# Patient Record
Sex: Male | Born: 1940
Health system: Southern US, Community
[De-identification: ages and names within clinical notes are randomized; demographics above are authoritative.]

## PROBLEM LIST (undated history)

## (undated) DIAGNOSIS — J309 Allergic rhinitis, unspecified: Secondary | ICD-10-CM

## (undated) DIAGNOSIS — R972 Elevated prostate specific antigen [PSA]: Secondary | ICD-10-CM

## (undated) DIAGNOSIS — Z860100 Personal history of colon polyps, unspecified: Secondary | ICD-10-CM

## (undated) DIAGNOSIS — Z87820 Personal history of traumatic brain injury: Secondary | ICD-10-CM

## (undated) DIAGNOSIS — L309 Dermatitis, unspecified: Secondary | ICD-10-CM

## (undated) DIAGNOSIS — M1990 Unspecified osteoarthritis, unspecified site: Secondary | ICD-10-CM

## (undated) DIAGNOSIS — C61 Malignant neoplasm of prostate: Secondary | ICD-10-CM

## (undated) DIAGNOSIS — R399 Unspecified symptoms and signs involving the genitourinary system: Secondary | ICD-10-CM

## (undated) DIAGNOSIS — E785 Hyperlipidemia, unspecified: Secondary | ICD-10-CM

## (undated) DIAGNOSIS — Z8572 Personal history of non-Hodgkin lymphomas: Secondary | ICD-10-CM

## (undated) DIAGNOSIS — Z973 Presence of spectacles and contact lenses: Secondary | ICD-10-CM

## (undated) HISTORY — DX: Hyperlipidemia, unspecified: E78.5

## (undated) HISTORY — PX: COLONOSCOPY: SHX174

## (undated) HISTORY — DX: Unspecified osteoarthritis, unspecified site: M19.90

---

## 1990-08-02 HISTORY — PX: UMBILICAL HERNIA REPAIR: SHX196

## 1992-08-02 HISTORY — PX: CHOLECYSTECTOMY: SHX55

## 2001-04-11 ENCOUNTER — Ambulatory Visit (HOSPITAL_COMMUNITY): Admission: RE | Admit: 2001-04-11 | Discharge: 2001-04-11 | Payer: Self-pay | Admitting: *Deleted

## 2001-04-11 ENCOUNTER — Encounter: Payer: Self-pay | Admitting: *Deleted

## 2001-05-26 ENCOUNTER — Encounter: Payer: Self-pay | Admitting: *Deleted

## 2001-05-26 ENCOUNTER — Ambulatory Visit (HOSPITAL_COMMUNITY): Admission: RE | Admit: 2001-05-26 | Discharge: 2001-05-26 | Payer: Self-pay | Admitting: *Deleted

## 2001-11-06 ENCOUNTER — Ambulatory Visit (HOSPITAL_COMMUNITY): Admission: RE | Admit: 2001-11-06 | Discharge: 2001-11-06 | Payer: Self-pay | Admitting: *Deleted

## 2001-11-06 ENCOUNTER — Encounter: Payer: Self-pay | Admitting: *Deleted

## 2002-06-05 ENCOUNTER — Encounter: Payer: Self-pay | Admitting: *Deleted

## 2002-06-05 ENCOUNTER — Ambulatory Visit (HOSPITAL_COMMUNITY): Admission: RE | Admit: 2002-06-05 | Discharge: 2002-06-05 | Payer: Self-pay | Admitting: *Deleted

## 2002-12-04 ENCOUNTER — Ambulatory Visit (HOSPITAL_COMMUNITY): Admission: RE | Admit: 2002-12-04 | Discharge: 2002-12-04 | Payer: Self-pay | Admitting: *Deleted

## 2002-12-04 ENCOUNTER — Encounter: Payer: Self-pay | Admitting: *Deleted

## 2003-06-18 ENCOUNTER — Ambulatory Visit (HOSPITAL_COMMUNITY): Admission: RE | Admit: 2003-06-18 | Discharge: 2003-06-18 | Payer: Self-pay | Admitting: Oncology

## 2003-11-18 ENCOUNTER — Emergency Department (HOSPITAL_COMMUNITY): Admission: AC | Admit: 2003-11-18 | Discharge: 2003-11-18 | Payer: Self-pay

## 2003-12-16 ENCOUNTER — Ambulatory Visit (HOSPITAL_COMMUNITY): Admission: RE | Admit: 2003-12-16 | Discharge: 2003-12-16 | Payer: Self-pay | Admitting: Oncology

## 2004-06-09 ENCOUNTER — Ambulatory Visit (HOSPITAL_COMMUNITY): Admission: RE | Admit: 2004-06-09 | Discharge: 2004-06-09 | Payer: Self-pay | Admitting: Oncology

## 2004-06-15 ENCOUNTER — Ambulatory Visit: Payer: Self-pay | Admitting: Oncology

## 2004-08-11 ENCOUNTER — Ambulatory Visit: Payer: Self-pay | Admitting: Internal Medicine

## 2004-09-17 ENCOUNTER — Ambulatory Visit: Payer: Self-pay | Admitting: Internal Medicine

## 2004-10-01 ENCOUNTER — Ambulatory Visit (HOSPITAL_COMMUNITY): Admission: RE | Admit: 2004-10-01 | Discharge: 2004-10-01 | Payer: Self-pay | Admitting: Internal Medicine

## 2004-12-17 ENCOUNTER — Ambulatory Visit: Payer: Self-pay | Admitting: Internal Medicine

## 2005-03-09 ENCOUNTER — Ambulatory Visit: Payer: Self-pay | Admitting: Internal Medicine

## 2005-03-18 ENCOUNTER — Ambulatory Visit: Payer: Self-pay | Admitting: Internal Medicine

## 2005-04-07 ENCOUNTER — Ambulatory Visit: Payer: Self-pay | Admitting: Internal Medicine

## 2005-06-03 ENCOUNTER — Ambulatory Visit: Payer: Self-pay | Admitting: Internal Medicine

## 2005-06-10 ENCOUNTER — Ambulatory Visit: Payer: Self-pay | Admitting: Internal Medicine

## 2005-06-14 ENCOUNTER — Ambulatory Visit: Payer: Self-pay | Admitting: Oncology

## 2005-06-15 ENCOUNTER — Ambulatory Visit: Payer: Self-pay | Admitting: Gastroenterology

## 2005-07-07 ENCOUNTER — Encounter: Payer: Self-pay | Admitting: Internal Medicine

## 2005-07-07 ENCOUNTER — Ambulatory Visit: Payer: Self-pay | Admitting: Gastroenterology

## 2005-11-02 ENCOUNTER — Ambulatory Visit: Payer: Self-pay | Admitting: Internal Medicine

## 2005-11-11 ENCOUNTER — Ambulatory Visit: Payer: Self-pay | Admitting: Internal Medicine

## 2005-11-25 ENCOUNTER — Ambulatory Visit: Payer: Self-pay | Admitting: Internal Medicine

## 2005-11-29 ENCOUNTER — Ambulatory Visit: Payer: Self-pay | Admitting: Internal Medicine

## 2005-12-22 ENCOUNTER — Ambulatory Visit: Payer: Self-pay | Admitting: Internal Medicine

## 2005-12-23 ENCOUNTER — Ambulatory Visit (HOSPITAL_COMMUNITY): Admission: RE | Admit: 2005-12-23 | Discharge: 2005-12-23 | Payer: Self-pay | Admitting: Internal Medicine

## 2006-06-10 ENCOUNTER — Ambulatory Visit: Payer: Self-pay | Admitting: Oncology

## 2006-07-06 ENCOUNTER — Ambulatory Visit: Payer: Self-pay | Admitting: Internal Medicine

## 2006-11-08 ENCOUNTER — Ambulatory Visit: Payer: Self-pay | Admitting: Internal Medicine

## 2006-11-08 LAB — CONVERTED CEMR LAB
AST: 21 units/L (ref 0–37)
Alkaline Phosphatase: 56 units/L (ref 39–117)
Bilirubin, Direct: 0.1 mg/dL (ref 0.0–0.3)
CO2: 30 meq/L (ref 19–32)
Calcium: 9.4 mg/dL (ref 8.4–10.5)
Cholesterol: 152 mg/dL (ref 0–200)
Eosinophils Relative: 5.7 % — ABNORMAL HIGH (ref 0.0–5.0)
GFR calc Af Amer: 110 mL/min
GFR calc non Af Amer: 91 mL/min
HCT: 45.7 % (ref 39.0–52.0)
HDL: 49.1 mg/dL (ref >39.0)
Hemoglobin: 15.7 g/dL (ref 13.0–17.0)
LDL Cholesterol: 90 mg/dL (ref 0–99)
Lymphocytes Relative: 26.8 % (ref 12.0–46.0)
MCHC: 34.3 g/dL (ref 30.0–36.0)
MCV: 93.5 fL (ref 78.0–100.0)
Neutrophils Relative %: 59.4 % (ref 43.0–77.0)
Platelets: 227 10*3/uL (ref 150–400)
Potassium: 4.5 meq/L (ref 3.5–5.1)
RBC: 4.89 M/uL (ref 4.22–5.81)
RDW: 12.3 % (ref 11.5–14.6)
Sodium: 144 meq/L (ref 135–145)
Total CHOL/HDL Ratio: 3.1
Triglycerides: 66 mg/dL (ref 0–149)
WBC: 9.3 10*3/uL (ref 4.5–10.5)

## 2006-11-15 ENCOUNTER — Ambulatory Visit: Payer: Self-pay | Admitting: Internal Medicine

## 2007-01-02 ENCOUNTER — Ambulatory Visit: Payer: Self-pay | Admitting: Internal Medicine

## 2007-03-31 DIAGNOSIS — E785 Hyperlipidemia, unspecified: Secondary | ICD-10-CM | POA: Insufficient documentation

## 2007-03-31 DIAGNOSIS — J479 Bronchiectasis, uncomplicated: Secondary | ICD-10-CM | POA: Insufficient documentation

## 2007-03-31 DIAGNOSIS — J309 Allergic rhinitis, unspecified: Secondary | ICD-10-CM | POA: Insufficient documentation

## 2007-05-18 ENCOUNTER — Ambulatory Visit: Payer: Self-pay | Admitting: Internal Medicine

## 2007-05-18 LAB — CONVERTED CEMR LAB
ALT: 16 units/L (ref 0–53)
AST: 16 units/L (ref 0–37)
Alkaline Phosphatase: 56 units/L (ref 39–117)
Cholesterol: 171 mg/dL (ref 0–200)
LDL Cholesterol: 114 mg/dL — ABNORMAL HIGH (ref 0–99)
Total CHOL/HDL Ratio: 3.9
Triglycerides: 64 mg/dL (ref 0–149)
VLDL: 13 mg/dL (ref 0–40)

## 2007-05-25 ENCOUNTER — Ambulatory Visit: Payer: Self-pay | Admitting: Internal Medicine

## 2007-05-25 DIAGNOSIS — M1990 Unspecified osteoarthritis, unspecified site: Secondary | ICD-10-CM | POA: Insufficient documentation

## 2007-05-25 LAB — CONVERTED CEMR LAB
Cholesterol, target level: 200 mg/dL
HDL goal, serum: 40 mg/dL

## 2007-06-09 ENCOUNTER — Ambulatory Visit: Payer: Self-pay | Admitting: Oncology

## 2007-07-04 ENCOUNTER — Ambulatory Visit: Payer: Self-pay | Admitting: Internal Medicine

## 2007-08-04 ENCOUNTER — Ambulatory Visit: Payer: Self-pay | Admitting: Family Medicine

## 2007-10-04 ENCOUNTER — Ambulatory Visit: Payer: Self-pay | Admitting: Internal Medicine

## 2007-10-04 DIAGNOSIS — J209 Acute bronchitis, unspecified: Secondary | ICD-10-CM | POA: Insufficient documentation

## 2007-10-04 DIAGNOSIS — T887XXA Unspecified adverse effect of drug or medicament, initial encounter: Secondary | ICD-10-CM | POA: Insufficient documentation

## 2007-10-04 LAB — CONVERTED CEMR LAB
CO2: 28 meq/L (ref 19–32)
Creatinine, Ser: 1 mg/dL (ref 0.4–1.5)
GFR calc Af Amer: 97 mL/min
Glucose, Bld: 81 mg/dL (ref 70–99)
Potassium: 4.9 meq/L (ref 3.5–5.1)
Sodium: 137 meq/L (ref 135–145)

## 2007-10-05 ENCOUNTER — Ambulatory Visit: Payer: Self-pay | Admitting: Cardiovascular Disease

## 2007-10-26 ENCOUNTER — Telehealth: Payer: Self-pay | Admitting: Internal Medicine

## 2007-10-27 ENCOUNTER — Ambulatory Visit: Payer: Self-pay | Admitting: Internal Medicine

## 2007-10-30 ENCOUNTER — Ambulatory Visit: Payer: Self-pay | Admitting: Internal Medicine

## 2007-12-07 ENCOUNTER — Ambulatory Visit: Payer: Self-pay | Admitting: Internal Medicine

## 2007-12-07 LAB — CONVERTED CEMR LAB
ALT: 21 units/L (ref 0–53)
Alkaline Phosphatase: 66 units/L (ref 39–117)
BUN: 11 mg/dL (ref 6–23)
Basophils Absolute: 0 10*3/uL (ref 0.0–0.1)
Basophils Relative: 0.1 % (ref 0.0–1.0)
Bilirubin Urine: NEGATIVE
CO2: 31 meq/L (ref 19–32)
Cholesterol: 177 mg/dL (ref 0–200)
Eosinophils Absolute: 1.1 10*3/uL — ABNORMAL HIGH (ref 0.0–0.7)
GFR calc Af Amer: 97 mL/min
GFR calc non Af Amer: 80 mL/min
Glucose, Bld: 99 mg/dL (ref 70–99)
Glucose, Urine, Semiquant: NEGATIVE
HCT: 46.2 % (ref 39.0–52.0)
Ketones, urine, test strip: NEGATIVE
Lymphocytes Relative: 21.3 % (ref 12.0–46.0)
Monocytes Absolute: 0.5 10*3/uL (ref 0.1–1.0)
Monocytes Relative: 5.9 % (ref 3.0–12.0)
PSA: 2.04 ng/mL (ref 0.10–4.00)
Potassium: 4.6 meq/L (ref 3.5–5.1)
Sodium: 143 meq/L (ref 135–145)
Specific Gravity, Urine: 1.015
Total CHOL/HDL Ratio: 4.2
Triglycerides: 85 mg/dL (ref 0–149)
VLDL: 17 mg/dL (ref 0–40)

## 2007-12-19 ENCOUNTER — Ambulatory Visit: Payer: Self-pay | Admitting: Internal Medicine

## 2007-12-19 DIAGNOSIS — Z8572 Personal history of non-Hodgkin lymphomas: Secondary | ICD-10-CM | POA: Insufficient documentation

## 2008-04-02 ENCOUNTER — Telehealth: Payer: Self-pay | Admitting: Internal Medicine

## 2008-04-26 ENCOUNTER — Emergency Department (HOSPITAL_COMMUNITY): Admission: EM | Admit: 2008-04-26 | Discharge: 2008-04-26 | Payer: Self-pay | Admitting: Family Medicine

## 2008-06-11 ENCOUNTER — Ambulatory Visit: Payer: Self-pay | Admitting: Oncology

## 2008-06-20 ENCOUNTER — Ambulatory Visit: Payer: Self-pay | Admitting: Internal Medicine

## 2008-06-26 ENCOUNTER — Encounter: Payer: Self-pay | Admitting: Internal Medicine

## 2008-12-19 ENCOUNTER — Ambulatory Visit: Payer: Self-pay | Admitting: Internal Medicine

## 2008-12-19 LAB — CONVERTED CEMR LAB
Albumin: 4 g/dL (ref 3.5–5.2)
Bilirubin Urine: NEGATIVE
Bilirubin, Direct: 0.1 mg/dL (ref 0.0–0.3)
Cholesterol: 200 mg/dL (ref 0–200)
Eosinophils Absolute: 0.2 10*3/uL (ref 0.0–0.7)
Glucose, Urine, Semiquant: NEGATIVE
HCT: 43.2 % (ref 39.0–52.0)
HDL: 52.5 mg/dL (ref >39.00)
Hemoglobin: 15.1 g/dL (ref 13.0–17.0)
Lymphocytes Relative: 25.6 % (ref 12.0–46.0)
Lymphs Abs: 1.8 10*3/uL (ref 0.7–4.0)
MCHC: 34.8 g/dL (ref 30.0–36.0)
MCV: 96.8 fL (ref 78.0–100.0)
Monocytes Absolute: 0.5 10*3/uL (ref 0.1–1.0)
Neutro Abs: 4.4 10*3/uL (ref 1.4–7.7)
Neutrophils Relative %: 63.6 % (ref 43.0–77.0)
Nitrite: NEGATIVE
PSA: 1.71 ng/mL (ref 0.10–4.00)
Platelets: 196 10*3/uL (ref 150.0–400.0)
Potassium: 3.8 meq/L (ref 3.5–5.1)
RDW: 12 % (ref 11.5–14.6)
Sodium: 142 meq/L (ref 135–145)
Specific Gravity, Urine: 1.02
Triglycerides: 46 mg/dL (ref 0.0–149.0)
Urobilinogen, UA: 0.2

## 2009-01-07 ENCOUNTER — Ambulatory Visit: Payer: Self-pay | Admitting: Internal Medicine

## 2009-01-07 DIAGNOSIS — B351 Tinea unguium: Secondary | ICD-10-CM | POA: Insufficient documentation

## 2009-02-24 ENCOUNTER — Telehealth: Payer: Self-pay | Admitting: Internal Medicine

## 2009-04-09 ENCOUNTER — Ambulatory Visit: Payer: Self-pay | Admitting: Internal Medicine

## 2009-04-09 LAB — CONVERTED CEMR LAB
AST: 21 units/L (ref 0–37)
Cholesterol: 148 mg/dL (ref 0–200)
Total Protein: 6.9 g/dL (ref 6.0–8.3)
Triglycerides: 62 mg/dL (ref 0.0–149.0)
VLDL: 12.4 mg/dL (ref 0.0–40.0)

## 2009-04-16 ENCOUNTER — Ambulatory Visit: Payer: Self-pay | Admitting: Internal Medicine

## 2009-04-25 ENCOUNTER — Telehealth (INDEPENDENT_AMBULATORY_CARE_PROVIDER_SITE_OTHER): Payer: Self-pay | Admitting: *Deleted

## 2009-05-12 ENCOUNTER — Ambulatory Visit: Payer: Self-pay | Admitting: Internal Medicine

## 2009-06-10 ENCOUNTER — Telehealth: Payer: Self-pay | Admitting: Internal Medicine

## 2009-06-11 ENCOUNTER — Ambulatory Visit: Payer: Self-pay | Admitting: Internal Medicine

## 2009-06-11 ENCOUNTER — Ambulatory Visit: Payer: Self-pay | Admitting: Family Medicine

## 2009-06-11 DIAGNOSIS — I6200 Nontraumatic subdural hemorrhage, unspecified: Secondary | ICD-10-CM | POA: Insufficient documentation

## 2009-06-20 ENCOUNTER — Ambulatory Visit: Payer: Self-pay | Admitting: Cardiology

## 2009-06-23 ENCOUNTER — Encounter (INDEPENDENT_AMBULATORY_CARE_PROVIDER_SITE_OTHER): Payer: Self-pay | Admitting: *Deleted

## 2009-07-02 ENCOUNTER — Ambulatory Visit: Payer: Self-pay | Admitting: Oncology

## 2009-07-04 ENCOUNTER — Encounter: Payer: Self-pay | Admitting: Internal Medicine

## 2009-07-10 ENCOUNTER — Ambulatory Visit: Payer: Self-pay | Admitting: Internal Medicine

## 2009-07-10 LAB — CONVERTED CEMR LAB
ALT: 24 units/L (ref 0–53)
AST: 21 units/L (ref 0–37)
Alkaline Phosphatase: 57 units/L (ref 39–117)
Bilirubin, Direct: 0 mg/dL (ref 0.0–0.3)

## 2009-07-31 ENCOUNTER — Encounter: Admission: RE | Admit: 2009-07-31 | Discharge: 2009-07-31 | Payer: Self-pay | Admitting: Neurosurgery

## 2009-08-06 ENCOUNTER — Telehealth: Payer: Self-pay | Admitting: Internal Medicine

## 2009-08-07 ENCOUNTER — Encounter: Payer: Self-pay | Admitting: Internal Medicine

## 2009-08-15 ENCOUNTER — Ambulatory Visit: Payer: Self-pay | Admitting: Internal Medicine

## 2009-08-15 DIAGNOSIS — J320 Chronic maxillary sinusitis: Secondary | ICD-10-CM | POA: Insufficient documentation

## 2009-09-12 ENCOUNTER — Encounter: Admission: RE | Admit: 2009-09-12 | Discharge: 2009-09-12 | Payer: Self-pay | Admitting: Neurosurgery

## 2009-09-23 ENCOUNTER — Telehealth: Payer: Self-pay | Admitting: Internal Medicine

## 2009-09-24 ENCOUNTER — Telehealth: Payer: Self-pay | Admitting: Internal Medicine

## 2009-09-29 ENCOUNTER — Encounter: Payer: Self-pay | Admitting: Internal Medicine

## 2009-10-10 ENCOUNTER — Ambulatory Visit: Payer: Self-pay | Admitting: Internal Medicine

## 2009-11-04 ENCOUNTER — Telehealth: Payer: Self-pay | Admitting: Internal Medicine

## 2010-02-05 ENCOUNTER — Ambulatory Visit: Payer: Self-pay | Admitting: Internal Medicine

## 2010-02-05 LAB — CONVERTED CEMR LAB
ALT: 25 units/L (ref 0–53)
AST: 24 units/L (ref 0–37)
Alkaline Phosphatase: 59 units/L (ref 39–117)
Basophils Relative: 0.7 % (ref 0.0–3.0)
Bilirubin, Direct: 0.2 mg/dL (ref 0.0–0.3)
Calcium: 9.5 mg/dL (ref 8.4–10.5)
Chloride: 106 meq/L (ref 96–112)
Eosinophils Relative: 3.6 % (ref 0.0–5.0)
HCT: 45.7 % (ref 39.0–52.0)
HDL: 55.2 mg/dL (ref >39.00)
Hemoglobin: 15.3 g/dL (ref 13.0–17.0)
Ketones, urine, test strip: NEGATIVE
LDL Cholesterol: 102 mg/dL — ABNORMAL HIGH (ref 0–99)
Lymphocytes Relative: 28.4 % (ref 12.0–46.0)
MCV: 96.2 fL (ref 78.0–100.0)
Monocytes Absolute: 0.6 10*3/uL (ref 0.1–1.0)
Monocytes Relative: 6.9 % (ref 3.0–12.0)
PSA: 2.1 ng/mL (ref 0.10–4.00)
RBC: 4.75 M/uL (ref 4.22–5.81)
Sodium: 144 meq/L (ref 135–145)
TSH: 1.61 microintl units/mL (ref 0.35–5.50)
Total Bilirubin: 0.6 mg/dL (ref 0.3–1.2)
Triglycerides: 56 mg/dL (ref 0.0–149.0)
WBC Urine, dipstick: NEGATIVE
pH: 7

## 2010-02-25 ENCOUNTER — Ambulatory Visit: Payer: Self-pay | Admitting: Internal Medicine

## 2010-03-16 ENCOUNTER — Telehealth: Payer: Self-pay | Admitting: Internal Medicine

## 2010-03-26 ENCOUNTER — Telehealth: Payer: Self-pay | Admitting: Internal Medicine

## 2010-03-26 ENCOUNTER — Ambulatory Visit: Payer: Self-pay | Admitting: Family Medicine

## 2010-06-04 ENCOUNTER — Ambulatory Visit: Payer: Self-pay | Admitting: Family Medicine

## 2010-07-02 ENCOUNTER — Ambulatory Visit: Payer: Self-pay | Admitting: Oncology

## 2010-07-03 ENCOUNTER — Encounter: Payer: Self-pay | Admitting: Internal Medicine

## 2010-08-13 ENCOUNTER — Ambulatory Visit
Admission: RE | Admit: 2010-08-13 | Discharge: 2010-08-13 | Payer: Self-pay | Source: Home / Self Care | Attending: Internal Medicine | Admitting: Internal Medicine

## 2010-08-13 ENCOUNTER — Other Ambulatory Visit: Payer: Self-pay | Admitting: Internal Medicine

## 2010-08-13 LAB — HEPATIC FUNCTION PANEL
ALT: 38 U/L (ref 0–53)
AST: 27 U/L (ref 0–37)
Albumin: 4.1 g/dL (ref 3.5–5.2)
Alkaline Phosphatase: 61 U/L (ref 39–117)
Bilirubin, Direct: 0.1 mg/dL (ref 0.0–0.3)
Total Bilirubin: 0.8 mg/dL (ref 0.3–1.2)
Total Protein: 6.7 g/dL (ref 6.0–8.3)

## 2010-08-13 LAB — LIPID PANEL
Cholesterol: 169 mg/dL (ref 0–200)
HDL: 50.4 mg/dL (ref >39.00)
LDL Cholesterol: 109 mg/dL — ABNORMAL HIGH (ref 0–99)
Total CHOL/HDL Ratio: 3
Triglycerides: 50 mg/dL (ref 0.0–149.0)
VLDL: 10 mg/dL (ref 0.0–40.0)

## 2010-08-19 ENCOUNTER — Ambulatory Visit
Admission: RE | Admit: 2010-08-19 | Discharge: 2010-08-19 | Payer: Self-pay | Source: Home / Self Care | Attending: Internal Medicine | Admitting: Internal Medicine

## 2010-09-01 NOTE — Assessment & Plan Note (Signed)
Summary: cough and congestion after z pack/bmw   Vital Signs:  Patient profile:   70 year old male Temp:     98.6 degrees F oral BP sitting:   118 / 68  (left arm) Cuff size:   regular  Vitals Entered By: Sid Falcon LPN (March 26, 2010 9:59 AM)  History of Present Illness: Same-day appointment.  Patient seen with a cough. Initial onset almost 2 weeks ago with productive cough and low-grade fever. Patient was placed on Zithromax and felt better on antibiotics. Stopped antibiotics last Friday and over the weekend had onset of recurrent productive cough. History of bronchiectasis. No fever this time. No hemoptysis. No dyspnea. Taking decongestant cough medication with minimal improvement. History of frequent respiratory issues in the past. Nonsmoker  Allergies (verified): No Known Drug Allergies  Past History:  Past Medical History: Last updated: 05/25/2007 Allergic rhinitis Hyperlipidemia lymphoma--non Hodgkins bronchiectasis Osteoarthritis  Review of Systems      See HPI  Physical Exam  General:  Well-developed,well-nourished,in no acute distress; alert,appropriate and cooperative throughout examination Ears:  External ear exam shows no significant lesions or deformities.  Otoscopic examination reveals clear canals, tympanic membranes are intact bilaterally without bulging, retraction, inflammation or discharge. Hearing is grossly normal bilaterally. Mouth:  Oral mucosa and oropharynx without lesions or exudates.  Teeth in good repair. Neck:  No deformities, masses, or tenderness noted. Lungs:  patient has no rales. He has a few faint inspiratory wheezes but no expiratory wheezes. No retractions. Symmetric breath sounds Heart:  normal rate and regular rhythm.   Extremities:  no edema   Impression & Recommendations:  Problem # 1:  ACUTE BRONCHITIS (ICD-466.0) samples of Avelox 400 mg daily for 7 days. Consider chest x-ray no better in one week The following  medications were removed from the medication list:    Zithromax Z-pak 250 Mg Tabs (Azithromycin) .Marland Kitchen... As directed His updated medication list for this problem includes:    Atuss Ds 30-4-30 Mg/36ml Susp (Pseudoephed hcl-cpm-dm hbr tan) .Marland Kitchen... 2 teaspoons q 12 hours as needed cough.  Complete Medication List: 1)  Lipitor 10 Mg Tabs (Atorvastatin calcium) .Marland Kitchen.. 1 once daily 2)  Claritin 10 Mg Tabs (Loratadine) .... As needed 3)  Ibuprofen 200 Mg Caps (Ibuprofen) .... As needed 4)  Vardenafil Hcl 10 Mg Tabs (Vardenafil hcl) .... One by mouth  1/2 prior 5)  Fish Oil Concentrate 1000 Mg Caps (Omega-3 fatty acids) .Marland Kitchen.. 1 by mouth 1 times a day 6)  Cialis 20 Mg Tabs (Tadalafil) .... One by mouth as directed 7)  Atuss Ds 30-4-30 Mg/14ml Susp (Pseudoephed hcl-cpm-dm hbr tan) .... 2 teaspoons q 12 hours as needed cough.  Patient Instructions: 1)  Take Avelox 400 mg daily for 7 days 2)  Follow up with Dr. Lovell Sheehan in one to 2 weeks if no better

## 2010-09-01 NOTE — Progress Notes (Signed)
Summary: green stuff  Phone Note Call from Patient Call back at Home Phone [phone]   Summary of Call: Cleared out mother-in-law's house [place] last week & lots of dust & dirt.  Got a  lot of gunk in chest.   Green stuff especially from chest & also nose.  Taking Maxiphen.  Wondering if I should get on something else or see Dr. Shela Commons.   HT  Initial call taken by: Rudy Jew, RN,  November 04, 2009 12:38 PM  Follow-up for Phone Call        per dr Yvonne Kendall have biaxin 500 two times a day for 10 days an d atuss 12hurs 8oz- 2 tsp every 12 hours Follow-up by: Willy Eddy, LPN,  November 04, 2009 12:57 PM    New/Updated Medications: BIAXIN 500 MG TABS (CLARITHROMYCIN) one by mouth two times a day x 10 days ATUSS DS 30-4-30 MG/5ML SUSP (PSEUDOEPHED HCL-CPM-DM HBR TAN) 2 teaspoons q 12 hours as needed cough Prescriptions: ATUSS DS 30-4-30 MG/5ML SUSP (PSEUDOEPHED HCL-CPM-DM HBR TAN) 2 teaspoons q 12 hours as needed cough  #8 oz. x 0   Entered by:   Lynann Beaver CMA   Authorized by:   Stacie Glaze MD   Signed by:   Lynann Beaver CMA on 11/04/2009   Method used:   Telephoned to ...       [facility] (retail)       [address]       [place], [place]  [address]       Ph: [phone] or [phone]       Fax: [phone]   RxID:   [id number] BIAXIN 500 MG TABS (CLARITHROMYCIN) one by mouth two times a day x 10 days  #20 x 0   Entered by:   Lynann Beaver CMA   Authorized by:   Stacie Glaze MD   Signed by:   Lynann Beaver CMA on 11/04/2009   Method used:   Telephoned to ...       [facility] (retail)       [address]       [place], [place]  [address]       Ph: [phone] or [phone]       Fax: [phone]   RxID:   [id number]   Appended Document: green stuff Pt. notified.

## 2010-09-01 NOTE — Progress Notes (Signed)
Summary: cough  Phone Note Call from Patient Call back at Home Phone [phone]   Caller: Patient Call For: Stacie Glaze MD Reason for Call: Acute Illness Action Taken:  Notified Summary of Call: Pt took antibioitics, and felt better, but is symptomatic again with congestion, cough, low grade fever.  Asking for appt or different antibiotics. [facility])  Initial call taken by: Lynann Beaver CMA,  March 26, 2010 8:19 AM  Follow-up for Phone Call        ov given given with dr Caryl Never for this am Follow-up by: Willy Eddy, LPN,  March 26, 2010 9:01 AM

## 2010-09-01 NOTE — Letter (Signed)
Summary: [facility]  [facility]   Imported By: Maryln Gottron 08/08/2009 12:22:36  _____________________________________________________________________  External Attachment:    Type:   Image     Comment:   External Document

## 2010-09-01 NOTE — Assessment & Plan Note (Signed)
Summary: 3 MO ROV/MM/pt rescd from snow day//ccm   Vital Signs:  Patient profile:   70 year old male Height:      71 inches Weight:      178 pounds BMI:     24.92 Temp:     98.2 degrees F oral Pulse rate:   68 / minute Resp:     14 per minute BP sitting:   130 / 80  (left arm)  Vitals Entered By: Willy Eddy, LPN (August 15, 2009 10:42 AM) CC: roa labs=has completed all antibioitc and continues to c/ochest congestion   CC:  roa labs=has completed all antibioitc and continues to c/ochest congestion.  History of Present Illness: The pt had a subacute hematoma the latest scans showed resolution by about 90% and he has been followed by the neurosurgeon. the head aches have reslved but with a coughs he seens to drift to the left still has some residual left  drift the. pt has a significant concussion hx he has no mental or physical issues as a result of the concussion.  The pt has persist cough and congestion with green phlem has ran out of the maxiphen  Preventive Screening-Counseling & Management  Alcohol-Tobacco     Smoking Status: quit     Year Quit: 1977     Passive Smoke Exposure: no  Problems Prior to Update: 1)  Subdural Hematoma  (ICD-432.1) 2)  Pneumonia, Right Upper Lobe  (ICD-486) 3)  Onychomycosis  (ICD-110.1) 4)  Acute Maxillary Sinusitis  (ICD-461.0) 5)  Preventive Health Care  (ICD-V70.0) 6)  Uns Advrs Eff Uns Rx Medicinal&biological Sbstnc  (ICD-995.20) 7)  Acute Bronchitis  (ICD-466.0) 8)  Osteoarthritis  (ICD-715.90) 9)  Bronchiectasis  (ICD-494.0) 10)  Hx of Non-hodgkin's Lymphoma  (ICD-202.80) 11)  Hyperlipidemia  (ICD-272.4) 12)  Allergic Rhinitis  (ICD-477.9)  Medications Prior to Update: 1)  Lipitor 10 Mg  Tabs (Atorvastatin Calcium) .Marland Kitchen.. 1 Once Daily 2)  Claritin 10 Mg  Tabs (Loratadine) .... As Needed 3)  Ibuprofen 200 Mg  Caps (Ibuprofen) .... As Needed 4)  Vardenafil Hcl 10 Mg Tabs (Vardenafil Hcl) .... One By Mouth  1/2 Prior 5)   Terbinafine Hcl 250 Mg Tabs (Terbinafine Hcl) .... One By Mouth Daily For 90 Days 6)  Fish Oil Concentrate 1000 Mg Caps (Omega-3 Fatty Acids) .Marland Kitchen.. 1 By Mouth 1 Times A Day 7)  Cialis 5 Mg Tabs (Tadalafil) .... One By Mouth Dialy 8)  Cefpodoxime Proxetil 100 Mg Tabs (Cefpodoxime Proxetil) .... One By Mouth Two Times A Day For 14 Days 9)  Maxiphen Dm 10-20-400 Mg Tabs (Phenylephrine-Dm-Gg) .... One By Mouth Gq 6 Hours Prn 10)  Allerx-D 120-2.5 Mg Xr12h-Tab (Pseudoephedrine-Methscopolamin) .... One By Mouth Bid 11)  Zithromax Z-Pak 250 Mg Tabs (Azithromycin) .... As Directed  Current Medications (verified): 1)  Lipitor 10 Mg  Tabs (Atorvastatin Calcium) .Marland Kitchen.. 1 Once Daily 2)  Claritin 10 Mg  Tabs (Loratadine) .... As Needed 3)  Ibuprofen 200 Mg  Caps (Ibuprofen) .... As Needed 4)  Vardenafil Hcl 10 Mg Tabs (Vardenafil Hcl) .... One By Mouth  1/2 Prior 5)  Terbinafine Hcl 250 Mg Tabs (Terbinafine Hcl) .... One By Mouth Daily For 90 Days 6)  Fish Oil Concentrate 1000 Mg Caps (Omega-3 Fatty Acids) .Marland Kitchen.. 1 By Mouth 1 Times A Day 7)  Cialis 5 Mg Tabs (Tadalafil) .... One By Mouth Dialy 8)  Maxiphen Dm 10-20-400 Mg Tabs (Phenylephrine-Dm-Gg) .... One By Mouth Gq 6 Hours Prn 9)  Allerx-D 120-2.5 Mg Xr12h-Tab (Pseudoephedrine-Methscopolamin) .... One By Mouth Bid 10)  Avelox 400 Mg Tabs (Moxifloxacin Hcl) .... One By Mouth Daily For 10 Days  Allergies (verified): No Known Drug Allergies  Past History:  Family History: Last updated: 07/04/2007 father passed  at age 40  CAD?  mother had brain tumor Family History Other cancer  Social History: Last updated: 05/25/2007 Married Former Smoker  Risk Factors: Exercise: yes (07/04/2007)  Risk Factors: Smoking Status: quit (08/15/2009) Passive Smoke Exposure: no (08/15/2009)  Past medical, surgical, family and social histories (including risk factors) reviewed, and no changes noted (except as noted below).  Past Medical History: Reviewed  history from 05/25/2007 and no changes required. Allergic rhinitis Hyperlipidemia lymphoma--non Hodgkins bronchiectasis Osteoarthritis  Past Surgical History: Reviewed history from 03/31/2007 and no changes required. chemo 3CHOP/XRT (20)  2001 polyps 1999 Cholecystectomy ventral hernia  Family History: Reviewed history from 07/04/2007 and no changes required. father passed  at age 47  CAD?  mother had brain tumor Family History Other cancer  Social History: Reviewed history from 05/25/2007 and no changes required. Married Former Smoker  Review of Systems  The patient denies anorexia, fever, weight loss, weight gain, vision loss, decreased hearing, hoarseness, chest pain, syncope, dyspnea on exertion, peripheral edema, prolonged cough, headaches, hemoptysis, abdominal pain, melena, hematochezia, severe indigestion/heartburn, hematuria, incontinence, genital sores, muscle weakness, suspicious skin lesions, transient blindness, difficulty walking, depression, unusual weight change, abnormal bleeding, enlarged lymph nodes, angioedema, breast masses, and testicular masses.    Physical Exam  General:  Well-developed,well-nourished,in no acute distress; alert,appropriate and cooperative throughout examination Head:  Normocephalic and atraumatic without obvious abnormalities. No apparent alopecia or balding. Eyes:  pupils equal round reactive to light. Fundi no acute change Ears:  R ear normal and L ear normal.   Nose:  nose piercing noted and no nasal discharge.   Lungs:  Normal respiratory effort, chest expands symmetrically. Lungs are clear to auscultation, no crackles or wheezes. Heart:  Normal rate and regular rhythm. S1 and S2 normal without gallop, murmur, click, rub or other extra sounds. Abdomen:  Bowel sounds positive,abdomen soft and non-tender without masses, organomegaly or hernias noted.   Impression & Recommendations:  Problem # 1:  SINUSITIS, MAXILLARY, CHRONIC  (ICD-473.0)  chronic features The following medications were removed from the medication list:    Cefpodoxime Proxetil 100 Mg Tabs (Cefpodoxime proxetil) ..... One by mouth two times a day for 14 days    Zithromax Z-pak 250 Mg Tabs (Azithromycin) .Marland Kitchen... As directed His updated medication list for this problem includes:    Maxiphen Dm 10-20-400 Mg Tabs (Phenylephrine-dm-gg) ..... One by mouth gq 6 hours prn    Allerx-d 120-2.5 Mg Xr12h-tab (Pseudoephedrine-methscopolamin) ..... One by mouth bid    Avelox 400 Mg Tabs (Moxifloxacin hcl) ..... One by mouth daily for 10 days  nstructed patient to complete antibiotics, and call for worsened shortness of breath or new symptoms.   Take antibiotics for full duration. Discussed treatment options including indications for coronal CT scan of sinuses and ENT referral.   Problem # 2:  SUBDURAL HEMATOMA (ICD-432.1) discussion of the persistance of neurological change orm the oncussion and from the hematoma ad the possibility of a neurological referral of the symptoms persst or worsen  Problem # 3:  ALLERGIC RHINITIS (ICD-477.9)  His updated medication list for this problem includes:    Claritin 10 Mg Tabs (Loratadine) .Marland Kitchen... As needed  Discussed use of allergy medications and environmental measures.   Complete Medication List:  1)  Lipitor 10 Mg Tabs (Atorvastatin calcium) .Marland Kitchen.. 1 once daily 2)  Claritin 10 Mg Tabs (Loratadine) .... As needed 3)  Ibuprofen 200 Mg Caps (Ibuprofen) .... As needed 4)  Vardenafil Hcl 10 Mg Tabs (Vardenafil hcl) .... One by mouth  1/2 prior 5)  Terbinafine Hcl 250 Mg Tabs (Terbinafine hcl) .... One by mouth daily for 90 days 6)  Fish Oil Concentrate 1000 Mg Caps (Omega-3 fatty acids) .Marland Kitchen.. 1 by mouth 1 times a day 7)  Cialis 5 Mg Tabs (Tadalafil) .... One by mouth dialy 8)  Maxiphen Dm 10-20-400 Mg Tabs (Phenylephrine-dm-gg) .... One by mouth gq 6 hours prn 9)  Allerx-d 120-2.5 Mg Xr12h-tab (Pseudoephedrine-methscopolamin)  .... One by mouth bid 10)  Avelox 400 Mg Tabs (Moxifloxacin hcl) .... One by mouth daily for 10 days  Patient Instructions: 1)  Please schedule a follow-up appointment in 2 months. Prescriptions: MAXIPHEN DM 10-20-400 MG TABS (PHENYLEPHRINE-DM-GG) one by mouth gq 6 hours prn  #30 x 11   Entered and Authorized by:   Stacie Glaze MD   Signed by:   Stacie Glaze MD on 08/15/2009   Method used:   Electronically to        [facility] [address] (retail)       [address]       [place], [place]  [address]       Ph: [phone] or [phone]       Fax: [phone]   RxID:   [id number]    Preventive Care Screening  Colonoscopy:    Date:  07/07/2005    Next Due:  07/2012    Results:  Adenomatous Polyp

## 2010-09-01 NOTE — Letter (Signed)
Summary: [facility]  [facility]   Imported By: Maryln Gottron 08/27/2009 12:52:29  _____________________________________________________________________  External Attachment:    Type:   Image     Comment:   External Document

## 2010-09-01 NOTE — Assessment & Plan Note (Signed)
Summary: headaches//ccm   Vital Signs:  Patient profile:   70 year old male Weight:      183 pounds Temp:     98.2 degrees F oral BP sitting:   130 / 80  (left arm) Cuff size:   regular  Vitals Entered By: Sid Falcon LPN (June 04, 2010 8:45 AM)  History of Present Illness: Pressure sensation R occ area.  R neck muscle soreness and "tightness".   3 week duration.  Off and on pain wihich is mild and dull to achy quality. Has noticed in past with delayed eating.  Ibuprofen helps. Worse with lateral bending.  Some recent dry cough.  Played racquetball yesterday without difficulty.  Last year L subdural hematoma.  Hx bronchiectasis.  2 week hx prod cough. Cough prod of yellow sputum. No dyspnea, fever, hemoptysis.  Hx of freq resp infections in past.  Allergies (verified): No Known Drug Allergies  Past History:  Past Medical History: Last updated: 05/25/2007 Allergic rhinitis Hyperlipidemia lymphoma--non Hodgkins bronchiectasis Osteoarthritis  Past Surgical History: Last updated: 03/31/2007 chemo 3CHOP/XRT (20)  2001 polyps 1999 Cholecystectomy ventral hernia  Family History: Last updated: 07/04/2007 father passed  at age 80  CAD?  mother had brain tumor Family History Other cancer  Social History: Last updated: 05/25/2007 Married Former Smoker  Risk Factors: Exercise: yes (07/04/2007)  Risk Factors: Smoking Status: quit (02/25/2010) Passive Smoke Exposure: no (02/25/2010) PMH-FH-SH reviewed for relevance  Review of Systems  The patient denies anorexia, fever, weight loss, vision loss, decreased hearing, chest pain, syncope, dyspnea on exertion, peripheral edema, prolonged cough, hemoptysis, abdominal pain, melena, hematochezia, severe indigestion/heartburn, and enlarged lymph nodes.    Physical Exam  General:  Well-developed,well-nourished,in no acute distress; alert,appropriate and cooperative throughout examination Head:  Normocephalic and  atraumatic without obvious abnormalities. No apparent alopecia or balding. Eyes:  pupils equal, pupils round, and pupils reactive to light.   Ears:  External ear exam shows no significant lesions or deformities.  Otoscopic examination reveals clear canals, tympanic membranes are intact bilaterally without bulging, retraction, inflammation or discharge. Hearing is grossly normal bilaterally. Mouth:  Oral mucosa and oropharynx without lesions or exudates.  Teeth in good repair. Neck:  No deformities, masses, or tenderness noted. tender near attachment muscle to occipital skull. Lungs:  Normal respiratory effort, chest expands symmetrically. Lungs are clear to auscultation, no crackles or wheezes. Heart:  Normal rate and regular rhythm. S1 and S2 normal without gallop, murmur, click, rub or other extra sounds. Neurologic:  alert & oriented X3, cranial nerves II-XII intact, strength normal in all extremities, gait normal, and finger-to-nose normal.   Skin:  no rashes.   Cervical Nodes:  No lymphadenopathy noted Psych:  normally interactive, good eye contact, not anxious appearing, and not depressed appearing.     Impression & Recommendations:  Problem # 1:  ACUTE BRONCHITIS (ICD-466.0)  His updated medication list for this problem includes:    Atuss Ds 30-4-30 Mg/61ml Susp (Pseudoephed hcl-cpm-dm hbr tan) .Marland Kitchen... 2 teaspoons q 12 hours as needed cough.    Azithromycin 250 Mg Tabs (Azithromycin) .Marland Kitchen... 2 by mouth today then one by mouth once daily for 4 days  Problem # 2:  NECK PAIN (ICD-723.1) Assessment: New suspect more muscular.  low dose muscle relaxer and moist heat and topical rub with message. His updated medication list for this problem includes:    Ibuprofen 200 Mg Caps (Ibuprofen) .Marland Kitchen... As needed    Cyclobenzaprine Hcl 5 Mg Tabs (Cyclobenzaprine hcl) ..... One  by mouth at bedtime  Problem # 3:  BRONCHIECTASIS (ICD-494.0)  Complete Medication List: 1)  Lipitor 10 Mg Tabs  (Atorvastatin calcium) .Marland Kitchen.. 1 once daily 2)  Claritin 10 Mg Tabs (Loratadine) .... As needed 3)  Ibuprofen 200 Mg Caps (Ibuprofen) .... As needed 4)  Vardenafil Hcl 10 Mg Tabs (Vardenafil hcl) .... One by mouth  1/2 prior 5)  Fish Oil Concentrate 1000 Mg Caps (Omega-3 fatty acids) .Marland Kitchen.. 1 by mouth 1 times a day 6)  Cialis 20 Mg Tabs (Tadalafil) .... One by mouth as directed 7)  Atuss Ds 30-4-30 Mg/52ml Susp (Pseudoephed hcl-cpm-dm hbr tan) .... 2 teaspoons q 12 hours as needed cough. 8)  Azithromycin 250 Mg Tabs (Azithromycin) .... 2 by mouth today then one by mouth once daily for 4 days 9)  Cyclobenzaprine Hcl 5 Mg Tabs (Cyclobenzaprine hcl) .... One by mouth at bedtime  Patient Instructions: 1)  Try moist heat to neck. 2)  Consdier topical rub such as Biofreeze with muscle massage. Prescriptions: CYCLOBENZAPRINE HCL 5 MG TABS (CYCLOBENZAPRINE HCL) one by mouth at bedtime  #20 x 0   Entered and Authorized by:   Evelena Peat MD   Signed by:   Evelena Peat MD on 06/04/2010   Method used:   Print then Give to Patient   RxID:   [id number] AZITHROMYCIN 250 MG TABS (AZITHROMYCIN) 2 by mouth today then one by mouth once daily for 4 days  #6 x 0   Entered and Authorized by:   Evelena Peat MD   Signed by:   Evelena Peat MD on 06/04/2010   Method used:   Electronically to        [facility] [address] [address] (retail)       [address]       [place], [place]  [address]       Ph: [phone] or [phone]       Fax: [phone]   RxID:   [id number]    Orders Added: 1)  Est. Patient Level IV [95638]   Immunization History:  Influenza Immunization History:    Influenza:  historical (05/13/2010)   Immunization History:  Influenza Immunization History:    Influenza:  Historical (05/13/2010)

## 2010-09-01 NOTE — Progress Notes (Signed)
Summary: another med  Phone Note Call from Patient Call back at Home Phone [phone]   Caller: Patient Call For: Stacie Glaze MD Summary of Call: allerx is $750- is there something else?  HT/[place]m Initial call taken by: Raechel Ache, RN,  September 24, 2009 1:32 PM  Follow-up for Phone Call        talked with pharmacist - can given sudaphedrine and chlorapheneramine otc- ok per dr Lovell Sheehan Follow-up by: Willy Eddy, LPN,  September 24, 2009 2:27 PM     Appended Document: another med wife informed

## 2010-09-01 NOTE — Letter (Signed)
Summary: [facility]  [facility]   Imported By: Maryln Gottron 10/22/2009 15:59:26  _____________________________________________________________________  External Attachment:    Type:   Image     Comment:   External Document

## 2010-09-01 NOTE — Assessment & Plan Note (Signed)
Summary: CPX/NJR/PT [facility] FROM BMP/CJR   Vital Signs:  Patient profile:   70 year old male Height:      71 inches Weight:      181 pounds BMI:     25.34 Temp:     98.1 degrees F oral Pulse rate:   68 / minute Resp:     12 per minute BP sitting:   120 / 76  (left arm)  Vitals Entered By: Willy Eddy, LPN (February 25, 2010 11:51 AM) CC: annual visit for disease managment Is Patient Diabetic? No   CC:  annual visit for disease managment.  History of Present Illness: The pt was asked about all immunizations, health maint. services that are appropriate to their age and was given guidance on diet exercize  and weight management   Preventive Screening-Counseling & Management  Alcohol-Tobacco     Smoking Status: quit     Smoke Cessation Stage: quit     Year Quit: 1977     Passive Smoke Exposure: no  Problems Prior to Update: 1)  Subdural Hematoma  (ICD-432.1) 2)  Onychomycosis  (ICD-110.1) 3)  Sinusitis, Maxillary, Chronic  (ICD-473.0) 4)  Preventive Health Care  (ICD-V70.0) 5)  Uns Advrs Eff Uns Rx Medicinal&biological Sbstnc  (ICD-995.20) 6)  Acute Bronchitis  (ICD-466.0) 7)  Osteoarthritis  (ICD-715.90) 8)  Bronchiectasis  (ICD-494.0) 9)  Hx of Non-hodgkin's Lymphoma  (ICD-202.80) 10)  Hyperlipidemia  (ICD-272.4) 11)  Allergic Rhinitis  (ICD-477.9)  Current Problems (verified): 1)  Subdural Hematoma  (ICD-432.1) 2)  Onychomycosis  (ICD-110.1) 3)  Sinusitis, Maxillary, Chronic  (ICD-473.0) 4)  Preventive Health Care  (ICD-V70.0) 5)  Uns Advrs Eff Uns Rx Medicinal&biological Sbstnc  (ICD-995.20) 6)  Acute Bronchitis  (ICD-466.0) 7)  Osteoarthritis  (ICD-715.90) 8)  Bronchiectasis  (ICD-494.0) 9)  Hx of Non-hodgkin's Lymphoma  (ICD-202.80) 10)  Hyperlipidemia  (ICD-272.4) 11)  Allergic Rhinitis  (ICD-477.9)  Medications Prior to Update: 1)  Lipitor 10 Mg  Tabs (Atorvastatin Calcium) .Marland Kitchen.. 1 Once Daily 2)  Claritin 10 Mg  Tabs (Loratadine) .... As Needed 3)   Ibuprofen 200 Mg  Caps (Ibuprofen) .... As Needed 4)  Vardenafil Hcl 10 Mg Tabs (Vardenafil Hcl) .... One By Mouth  1/2 Prior 5)  Terbinafine Hcl 250 Mg Tabs (Terbinafine Hcl) .... One By Mouth Daily For 90 Days 6)  Fish Oil Concentrate 1000 Mg Caps (Omega-3 Fatty Acids) .Marland Kitchen.. 1 By Mouth 1 Times A Day 7)  Cialis 20 Mg Tabs (Tadalafil) .... One By Mouth As Directed 8)  Biaxin 500 Mg Tabs (Clarithromycin) .... One By Mouth Two Times A Day X 10 Days 9)  Atuss Ds 30-4-30 Mg/59ml Susp (Pseudoephed Hcl-Cpm-Dm Hbr Tan) .... 2 Teaspoons Q 12 Hours As Needed Cough  Current Medications (verified): 1)  Lipitor 10 Mg  Tabs (Atorvastatin Calcium) .Marland Kitchen.. 1 Once Daily 2)  Claritin 10 Mg  Tabs (Loratadine) .... As Needed 3)  Ibuprofen 200 Mg  Caps (Ibuprofen) .... As Needed 4)  Vardenafil Hcl 10 Mg Tabs (Vardenafil Hcl) .... One By Mouth  1/2 Prior 5)  Fish Oil Concentrate 1000 Mg Caps (Omega-3 Fatty Acids) .Marland Kitchen.. 1 By Mouth 1 Times A Day 6)  Cialis 20 Mg Tabs (Tadalafil) .... One By Mouth As Directed  Allergies (verified): No Known Drug Allergies  Past History:  Family History: Last updated: 07/04/2007 father passed  at age 26  CAD?  mother had brain tumor Family History Other cancer  Social History: Last updated: 05/25/2007 Married Former Smoker  Risk Factors: Exercise: yes (07/04/2007)  Risk Factors: Smoking Status: quit (02/25/2010) Passive Smoke Exposure: no (02/25/2010)  Past medical, surgical, family and social histories (including risk factors) reviewed, and no changes noted (except as noted below).  Past Medical History: Reviewed history from 05/25/2007 and no changes required. Allergic rhinitis Hyperlipidemia lymphoma--non Hodgkins bronchiectasis Osteoarthritis  Past Surgical History: Reviewed history from 03/31/2007 and no changes required. chemo 3CHOP/XRT (18)  2001 polyps 1999 Cholecystectomy ventral hernia  Family History: Reviewed history from 07/04/2007 and no  changes required. father passed  at age 53  CAD?  mother had brain tumor Family History Other cancer  Social History: Reviewed history from 05/25/2007 and no changes required. Married Former Smoker  Review of Systems  The patient denies anorexia, fever, weight loss, weight gain, vision loss, decreased hearing, hoarseness, chest pain, syncope, dyspnea on exertion, peripheral edema, prolonged cough, headaches, hemoptysis, abdominal pain, melena, hematochezia, severe indigestion/heartburn, hematuria, incontinence, genital sores, muscle weakness, suspicious skin lesions, transient blindness, difficulty walking, depression, unusual weight change, abnormal bleeding, enlarged lymph nodes, angioedema, and breast masses.    Physical Exam  General:  Well-developed,well-nourished,in no acute distress; alert,appropriate and cooperative throughout examination Head:  normocephalic and male-pattern balding.   Eyes:  pupils equal round reactive to light. Fundi no acute change Ears:  R ear normal and L ear normal.   Nose:  no external deformity and no nasal discharge.   Neck:  No deformities, masses, or tenderness noted. Lungs:  Normal respiratory effort, chest expands symmetrically. Lungs are clear to auscultation, no crackles or wheezes. Heart:  Normal rate and regular rhythm. S1 and S2 normal without gallop, murmur, click, rub or other extra sounds. Abdomen:  Bowel sounds positive,abdomen soft and non-tender without masses, organomegaly or hernias noted. Msk:  No deformity or scoliosis noted of thoracic or lumbar spine.   Pulses:  R and L carotid,radial,femoral,dorsalis pedis and posterior tibial pulses are full and equal bilaterally Extremities:  No clubbing, cyanosis, edema, or deformity noted with normal full range of motion of all joints.   Neurologic:  alert & oriented X3, cranial nerves II-XII intact, strength normal in all extremities, gait normal, DTRs symmetrical and normal, and finger-to-nose  normal.     Impression & Recommendations:  Problem # 1:  PREVENTIVE HEALTH CARE (ICD-V70.0) The pt was asked about all immunizations, health maint. services that are appropriate to their age and was given guidance on diet exercize  and weight management  Colonoscopy: Adenomatous Polyp (07/07/2005) Td Booster: Tdap (08/02/2005)   Flu Vax: Historical (05/03/2007)   Pneumovax: Pneumovax (08/02/2005) Chol: 168 (02/05/2010)   HDL: 55.20 (02/05/2010)   LDL: 102 (02/05/2010)   TG: 56.0 (02/05/2010) TSH: 1.61 (02/05/2010)   PSA: 2.10 (02/05/2010) Next Colonoscopy due:: 07/2012 (08/15/2009)  Discussed using sunscreen, use of alcohol, drug use, self testicular exam, routine dental care, routine eye care, routine physical exam, seat belts, multiple vitamins, osteoporosis prevention, adequate calcium intake in diet, and recommendations for immunizations.  Discussed exercise and checking cholesterol.  Discussed gun safety, safe sex, and contraception. Also recommend checking PSA.  Complete Medication List: 1)  Lipitor 10 Mg Tabs (Atorvastatin calcium) .Marland Kitchen.. 1 once daily 2)  Claritin 10 Mg Tabs (Loratadine) .... As needed 3)  Ibuprofen 200 Mg Caps (Ibuprofen) .... As needed 4)  Vardenafil Hcl 10 Mg Tabs (Vardenafil hcl) .... One by mouth  1/2 prior 5)  Fish Oil Concentrate 1000 Mg Caps (Omega-3 fatty acids) .Marland Kitchen.. 1 by mouth 1 times a day 6)  Cialis 20  Mg Tabs (Tadalafil) .... One by mouth as directed  Patient Instructions: 1)  Please schedule a follow-up appointment in 6 months.  Appended Document: Office Visit ([facility] 05)      Allergies: No Known Drug Allergies   Complete Medication List: 1)  Lipitor 10 Mg Tabs (Atorvastatin calcium) .Marland Kitchen.. 1 once daily 2)  Claritin 10 Mg Tabs (Loratadine) .... As needed 3)  Ibuprofen 200 Mg Caps (Ibuprofen) .... As needed 4)  Vardenafil Hcl 10 Mg Tabs (Vardenafil hcl) .... One by mouth  1/2 prior 5)  Fish Oil Concentrate 1000 Mg Caps (Omega-3 fatty  acids) .Marland Kitchen.. 1 by mouth 1 times a day 6)  Cialis 20 Mg Tabs (Tadalafil) .... One by mouth as directed  Other Orders: Pneumococcal Vaccine (32355) Admin 1st Vaccine (73220)    Immunizations Administered:  Pneumonia Vaccine:    Vaccine Type: Pneumovax    Site: right deltoid    Mfr: Merck    Dose: 0.5 ml    Route: IM    Given by: Kern Reap CMA (AAMA)    Exp. Date: 08/01/2011    Lot #: [id number]

## 2010-09-01 NOTE — Assessment & Plan Note (Signed)
Summary: 2 month rov/njr   Vital Signs:  Patient profile:   70 year old male Height:      71 inches Weight:      180 pounds BMI:     25.20 Temp:     98.2 degrees F oral Pulse rate:   72 / minute Resp:     14 per minute BP sitting:   120 / 70  (left arm)  Vitals Entered By: Willy Eddy, LPN (October 10, 2009 10:09 AM)  Nutrition Counseling: Patient's BMI is greater than 25 and therefore counseled on weight management options. CC: roa- states last week all of a sudden he started feeling better, Lipid Management   CC:  roa- states last week all of a sudden he started feeling better and Lipid Management.  History of Present Illness: The pt was treated for probable pnemonia ( altered lung from radiation in the past)   Follow-Up Visit      This is a 70 year old man who presents for Follow-up visit.  The patient denies chest pain, palpitations, dizziness, syncope, low blood sugar symptoms, high blood sugar symptoms, edema, SOB, DOE, PND, and orthopnea.  Since the last visit the patient notes no new problems or concerns.  The patient reports taking meds as prescribed.  When questioned about possible medication side effects, the patient notes none.    Lipid Management History:      Positive NCEP/ATP III risk factors include male age 68 years old or older.  Negative NCEP/ATP III risk factors include no family history for ischemic heart disease, non-tobacco-user status, non-hypertensive, no ASHD (atherosclerotic heart disease), no prior stroke/TIA, no peripheral vascular disease, and no history of aortic aneurysm.     Preventive Screening-Counseling & Management  Alcohol-Tobacco     Smoking Status: quit     Year Quit: 1977     Passive Smoke Exposure: no  Problems Prior to Update: 1)  Subdural Hematoma  (ICD-432.1) 2)  Onychomycosis  (ICD-110.1) 3)  Sinusitis, Maxillary, Chronic  (ICD-473.0) 4)  Preventive Health Care  (ICD-V70.0) 5)  Uns Advrs Eff Uns Rx Medicinal&biological  Sbstnc  (ICD-995.20) 6)  Acute Bronchitis  (ICD-466.0) 7)  Osteoarthritis  (ICD-715.90) 8)  Bronchiectasis  (ICD-494.0) 9)  Hx of Non-hodgkin's Lymphoma  (ICD-202.80) 10)  Hyperlipidemia  (ICD-272.4) 11)  Allergic Rhinitis  (ICD-477.9)  Current Problems (verified): 1)  Subdural Hematoma  (ICD-432.1) 2)  Onychomycosis  (ICD-110.1) 3)  Sinusitis, Maxillary, Chronic  (ICD-473.0) 4)  Preventive Health Care  (ICD-V70.0) 5)  Uns Advrs Eff Uns Rx Medicinal&biological Sbstnc  (ICD-995.20) 6)  Acute Bronchitis  (ICD-466.0) 7)  Osteoarthritis  (ICD-715.90) 8)  Bronchiectasis  (ICD-494.0) 9)  Hx of Non-hodgkin's Lymphoma  (ICD-202.80) 10)  Hyperlipidemia  (ICD-272.4) 11)  Allergic Rhinitis  (ICD-477.9)  Medications Prior to Update: 1)  Lipitor 10 Mg  Tabs (Atorvastatin Calcium) .Marland Kitchen.. 1 Once Daily 2)  Claritin 10 Mg  Tabs (Loratadine) .... As Needed 3)  Ibuprofen 200 Mg  Caps (Ibuprofen) .... As Needed 4)  Vardenafil Hcl 10 Mg Tabs (Vardenafil Hcl) .... One By Mouth  1/2 Prior 5)  Terbinafine Hcl 250 Mg Tabs (Terbinafine Hcl) .... One By Mouth Daily For 90 Days 6)  Fish Oil Concentrate 1000 Mg Caps (Omega-3 Fatty Acids) .Marland Kitchen.. 1 By Mouth 1 Times A Day 7)  Cialis 5 Mg Tabs (Tadalafil) .... One By Mouth Dialy 8)  Maxiphen Dm 10-20-400 Mg Tabs (Phenylephrine-Dm-Gg) .... One By Mouth Gq 6 Hours Prn 9)  Allerx-D 120-2.5  Mg Xr12h-Tab (Pseudoephedrine-Methscopolamin) .... One By Mouth Bid 10)  Avelox 400 Mg Tabs (Moxifloxacin Hcl) .... One By Mouth Daily For 10 Days 11)  Zithromax Z-Pak 250 Mg Tabs (Azithromycin) .... Use As Directed 12)  Allerx Dose Pack  Misc (Pse-Methscop & Cpm-Pe-Methscop) .... Use As Directed  Current Medications (verified): 1)  Lipitor 10 Mg  Tabs (Atorvastatin Calcium) .Marland Kitchen.. 1 Once Daily 2)  Claritin 10 Mg  Tabs (Loratadine) .... As Needed 3)  Ibuprofen 200 Mg  Caps (Ibuprofen) .... As Needed 4)  Vardenafil Hcl 10 Mg Tabs (Vardenafil Hcl) .... One By Mouth  1/2 Prior 5)   Terbinafine Hcl 250 Mg Tabs (Terbinafine Hcl) .... One By Mouth Daily For 90 Days 6)  Fish Oil Concentrate 1000 Mg Caps (Omega-3 Fatty Acids) .Marland Kitchen.. 1 By Mouth 1 Times A Day 7)  Cialis 5 Mg Tabs (Tadalafil) .... One By Mouth Dialy  Allergies (verified): No Known Drug Allergies  Past History:  Family History: Last updated: 07/04/2007 father passed  at age 44  CAD?  mother had brain tumor Family History Other cancer  Social History: Last updated: 05/25/2007 Married Former Smoker  Risk Factors: Exercise: yes (07/04/2007)  Risk Factors: Smoking Status: quit (10/10/2009) Passive Smoke Exposure: no (10/10/2009)  Past medical, surgical, family and social histories (including risk factors) reviewed, and no changes noted (except as noted below).  Past Medical History: Reviewed history from 05/25/2007 and no changes required. Allergic rhinitis Hyperlipidemia lymphoma--non Hodgkins bronchiectasis Osteoarthritis  Past Surgical History: Reviewed history from 03/31/2007 and no changes required. chemo 3CHOP/XRT (20)  2001 polyps 1999 Cholecystectomy ventral hernia  Family History: Reviewed history from 07/04/2007 and no changes required. father passed  at age 42  CAD?  mother had brain tumor Family History Other cancer  Social History: Reviewed history from 05/25/2007 and no changes required. Married Former Smoker  Review of Systems  The patient denies anorexia, fever, weight loss, weight gain, vision loss, decreased hearing, hoarseness, chest pain, syncope, dyspnea on exertion, peripheral edema, prolonged cough, headaches, hemoptysis, abdominal pain, melena, hematochezia, severe indigestion/heartburn, hematuria, incontinence, genital sores, muscle weakness, suspicious skin lesions, transient blindness, difficulty walking, depression, unusual weight change, abnormal bleeding, enlarged lymph nodes, angioedema, and breast masses.    Physical Exam  General:   Well-developed,well-nourished,in no acute distress; alert,appropriate and cooperative throughout examination Head:  Normocephalic and atraumatic without obvious abnormalities. No apparent alopecia or balding. Eyes:  pupils equal round reactive to light. Fundi no acute change Ears:  R ear normal and L ear normal.   Nose:  nose piercing noted and no nasal discharge.   Mouth:  Oral mucosa and oropharynx without lesions or exudates.  Teeth in good repair. Neck:  No deformities, masses, or tenderness noted. Lungs:  Normal respiratory effort, chest expands symmetrically. Lungs are clear to auscultation, no crackles or wheezes. Heart:  Normal rate and regular rhythm. S1 and S2 normal without gallop, murmur, click, rub or other extra sounds. Abdomen:  Bowel sounds positive,abdomen soft and non-tender without masses, organomegaly or hernias noted.   Impression & Recommendations:  Problem # 1:  BRONCHIECTASIS (ICD-494.0) resolved  Problem # 2:  HYPERLIPIDEMIA (ICD-272.4) has a plan for the  medicare /welness  with managed medicare His updated medication list for this problem includes:    Lipitor 10 Mg Tabs (Atorvastatin calcium) .Marland Kitchen... 1 once daily  Labs Reviewed: SGOT: 21 (07/10/2009)   SGPT: 24 (07/10/2009)  Lipid Goals: Chol Goal: 200 (05/25/2007)   HDL Goal: 40 (05/25/2007)   LDL Goal:  160 (05/25/2007)   TG Goal: 150 (05/25/2007)  10 Yr Risk Heart Disease: 6 % Prior 10 Yr Risk Heart Disease: 7 % (04/16/2009)   HDL:48.00 (04/09/2009), 52.50 (12/19/2008)  LDL:88 (04/09/2009), 138 (16/05/9603)  Chol:148 (04/09/2009), 200 (12/19/2008)  Trig:62.0 (04/09/2009), 46.0 (12/19/2008)  Problem # 3:  ALLERGIC RHINITIS (ICD-477.9) allergies stable His updated medication list for this problem includes:    Claritin 10 Mg Tabs (Loratadine) .Marland Kitchen... As needed  Discussed use of allergy medications and environmental measures.   Complete Medication List: 1)  Lipitor 10 Mg Tabs (Atorvastatin calcium) .Marland Kitchen.. 1  once daily 2)  Claritin 10 Mg Tabs (Loratadine) .... As needed 3)  Ibuprofen 200 Mg Caps (Ibuprofen) .... As needed 4)  Vardenafil Hcl 10 Mg Tabs (Vardenafil hcl) .... One by mouth  1/2 prior 5)  Terbinafine Hcl 250 Mg Tabs (Terbinafine hcl) .... One by mouth daily for 90 days 6)  Fish Oil Concentrate 1000 Mg Caps (Omega-3 fatty acids) .Marland Kitchen.. 1 by mouth 1 times a day 7)  Cialis 20 Mg Tabs (Tadalafil) .... One by mouth as directed  Lipid Assessment/Plan:      Based on NCEP/ATP III, the patient's risk factor category is "0-1 risk factors".  The patient's lipid goals are as follows: Total cholesterol goal is 200; LDL cholesterol goal is 160; HDL cholesterol goal is 40; Triglyceride goal is 150.    Patient Instructions: 1)  CPX in June/ july 2)  labs in advance he has [facility] medicare! Prescriptions: CIALIS 20 MG TABS (TADALAFIL) one by mouth as directed  #5 x 11   Entered and Authorized by:   Stacie Glaze MD   Signed by:   Stacie Glaze MD on 10/10/2009   Method used:   Electronically to        [facility] [address] (retail)       [address]       [place], [place]  [address]       Ph: [phone] or [phone]       Fax: [phone]   RxID:   [id number]

## 2010-09-01 NOTE — Progress Notes (Signed)
Summary: URI/sinus  Phone Note Call from Patient   Caller: Patient Call For: Stacie Glaze MD Summary of Call: Productive cough (clear) since Saturday and low grade fever. Aching all over.  [facility] [address]) [phone] Taking Maxophen. Initial call taken by: Lynann Beaver CMA,  March 16, 2010 8:04 AM  Follow-up for Phone Call        per dr Lovell Sheehan- may have z pack and atuss12 hours 2 tsps every 12 hours-6 oz Follow-up by: Willy Eddy, LPN,  March 16, 2010 9:19 AM    New/Updated Medications: ZITHROMAX Z-PAK 250 MG TABS (AZITHROMYCIN) as directed ATUSS DS 30-4-30 MG/5ML SUSP (PSEUDOEPHED HCL-CPM-DM HBR TAN) 2 teaspoons q 12 hours as needed cough. Prescriptions: ATUSS DS 30-4-30 MG/5ML SUSP (PSEUDOEPHED HCL-CPM-DM HBR TAN) 2 teaspoons q 12 hours as needed cough.  #6 x 0   Entered by:   Lynann Beaver CMA   Authorized by:   Stacie Glaze MD   Signed by:   Lynann Beaver CMA on 03/16/2010   Method used:   Telephoned to ...       [facility] (retail)       [address]       [place], [place]  [address]       Ph: [phone] or [phone]       Fax: [phone]   RxID:   [id number] ZITHROMAX Z-PAK 250 MG TABS (AZITHROMYCIN) as directed  #6 x 0   Entered by:   Lynann Beaver CMA   Authorized by:   Stacie Glaze MD   Signed by:   Lynann Beaver CMA on 03/16/2010   Method used:   Telephoned to ...       [facility] [address] [facility] (retail)       [address]       [place], [place]  [address]       Ph: [phone] or [phone]       Fax: [phone]   RxID:   [id number]

## 2010-09-01 NOTE — Progress Notes (Signed)
Summary: Pt req antibiotic for upper respiratory inf  Phone Note Call from Patient Call back at [phone] Cell   Caller: Patient Summary of Call: Pt has on 08/16/09, but pt has upper respiratory inf and would like Dr. Lovell Sheehan to call in a script for antibiotic. Please call in to [facility] on [address]. Pt has a death in the family and will be out town.  Please call in asap.  Initial call taken by: Lucy Antig[place],  August 06, 2009 10:52 AM  Follow-up for Phone Call        per dr Leretha Pol --allerx #20 1 two times a day and z pack Follow-up by: Willy Eddy, LPN,  August 06, 2009 12:52 PM    New/Updated Medications: ALLERX-D 120-2.5 MG XR12H-TAB (PSEUDOEPHEDRINE-METHSCOPOLAMIN) one by mouth bid ZITHROMAX Z-PAK 250 MG TABS (AZITHROMYCIN) as directed Prescriptions: ZITHROMAX Z-PAK 250 MG TABS (AZITHROMYCIN) as directed  #6 x 0   Entered by:   Lynann Beaver CMA   Authorized by:   Stacie Glaze MD   Signed by:   Lynann Beaver CMA on 08/06/2009   Method used:   Electronically to        [facility] [address] [address] (retail)       [address]       [place], [place]  [address]       Ph: [phone] or [phone]       Fax: [phone]   RxID:   [id number] ALLERX-D 120-2.5 MG XR12H-TAB (PSEUDOEPHEDRINE-METHSCOPOLAMIN) one by mouth bid  #20 x 0   Entered by:   Lynann Beaver CMA   Authorized by:   Stacie Glaze MD   Signed by:   Lynann Beaver CMA on 08/06/2009   Method used:   Electronically to        [facility] [address] (retail)       [address]       [place], [place]  [address]       Ph: [phone] or [phone]       Fax: [phone]   RxID:   [id number]  Pt. notified.

## 2010-09-01 NOTE — Progress Notes (Signed)
Summary: flu?  Phone Note Call from Patient   Caller: Patient Call For: Stacie Glaze MD Reason for Call: Acute Illness Summary of Call: Pt started running fever last night with mylagias and cough.  Now, is having chest congestion and green mucus. Karin Golden [address]) [phone] Initial call taken by: Lynann Beaver CMA,  September 23, 2009 10:30 AM  Follow-up for Phone Call        per  dr Lovell Sheehan- give zpack and allerx dose pack-sent in and pt informed Follow-up by: Willy Eddy, LPN,  September 23, 2009 12:52 PM    New/Updated Medications: ZITHROMAX Z-PAK 250 MG TABS (AZITHROMYCIN) Use as directed ALLERX DOSE PACK  MISC (PSE-METHSCOP & CPM-PE-METHSCOP) Use as directed Prescriptions: ALLERX DOSE PACK  MISC (PSE-METHSCOP & CPM-PE-METHSCOP) Use as directed  #1 x 0   Entered by:   Willy Eddy, LPN   Authorized by:   Stacie Glaze MD   Signed by:   Willy Eddy, LPN on 08/65/7846   Method used:   Electronically to        [facility] [address] (retail)       [address]       [place], [place]  [address]       Ph: [phone] or [phone]       Fax: [phone]   RxID:   [id number] Z-PAK 250 MG TABS (AZITHROMYCIN) Use as directed  #1 x 0   Entered by:   Willy Eddy, LPN   Authorized by:   Stacie Glaze MD   Signed by:   Willy Eddy, LPN on 10/93/2355   Method used:   Electronically to        [facility] [address] (retail)       [address]       [place], [place]  [address]       Ph: [phone] or [phone]       Fax: [phone]   RxID:   [id number]

## 2010-09-03 NOTE — Assessment & Plan Note (Signed)
Summary: 6 month rov/nrj   Vital Signs:  Patient profile:   70 year old male Height:      71 inches Weight:      184 pounds BMI:     25.76 Temp:     98.2 degrees F oral Pulse rate:   60 / minute Resp:     14 per minute BP sitting:   130 / 80  (left arm)  Vitals Entered By: Willy Eddy, LPN (August 19, 2010 9:07 AM) CC: roa labs, Lipid Management Is Patient Diabetic? No   Primary Care Provider:  Stacie Glaze MD  CC:  roa labs and Lipid Management.  History of Present Illness:  Hyperlipidemia Follow-Up      This is a 69 year old man who presents for Hyperlipidemia follow-up.  The patient denies muscle aches, GI upset, abdominal pain, flushing, itching, constipation, diarrhea, and fatigue.  The patient denies the following symptoms: chest pain/pressure, exercise intolerance, dypsnea, palpitations, syncope, and pedal edema.  Compliance with medications (by patient report) has been near 100%.  Dietary compliance has been good.  The patient reports exercising 3-4X per week.  Adjunctive measures currently used by the patient include weight reduction.    Lipid Management History:      Positive NCEP/ATP III risk factors include male age 78 years old or older.  Negative NCEP/ATP III risk factors include no family history for ischemic heart disease, non-tobacco-user status, non-hypertensive, no ASHD (atherosclerotic heart disease), no prior stroke/TIA, no peripheral vascular disease, and no history of aortic aneurysm.     Preventive Screening-Counseling & Management  Alcohol-Tobacco     Smoking Status: quit     Smoke Cessation Stage: quit     Year Quit: 1977     Passive Smoke Exposure: no     Tobacco Counseling: to remain off tobacco products  Problems Prior to Update: 1)  Subdural Hematoma  (ICD-432.1) 2)  Onychomycosis  (ICD-110.1) 3)  Sinusitis, Maxillary, Chronic  (ICD-473.0) 4)  Preventive Health Care  (ICD-V70.0) 5)  Uns Advrs Eff Uns Rx Medicinal&biological Sbstnc   (ICD-995.20) 6)  Acute Bronchitis  (ICD-466.0) 7)  Osteoarthritis  (ICD-715.90) 8)  Bronchiectasis  (ICD-494.0) 9)  Hx of Non-hodgkin's Lymphoma  (ICD-202.80) 10)  Hyperlipidemia  (ICD-272.4) 11)  Allergic Rhinitis  (ICD-477.9)  Current Problems (verified): 1)  Subdural Hematoma  (ICD-432.1) 2)  Onychomycosis  (ICD-110.1) 3)  Sinusitis, Maxillary, Chronic  (ICD-473.0) 4)  Preventive Health Care  (ICD-V70.0) 5)  Uns Advrs Eff Uns Rx Medicinal&biological Sbstnc  (ICD-995.20) 6)  Acute Bronchitis  (ICD-466.0) 7)  Osteoarthritis  (ICD-715.90) 8)  Bronchiectasis  (ICD-494.0) 9)  Hx of Non-hodgkin's Lymphoma  (ICD-202.80) 10)  Hyperlipidemia  (ICD-272.4) 11)  Allergic Rhinitis  (ICD-477.9)  Medications Prior to Update: 1)  Lipitor 10 Mg  Tabs (Atorvastatin Calcium) .Marland Kitchen.. 1 Once Daily 2)  Claritin 10 Mg  Tabs (Loratadine) .... As Needed 3)  Ibuprofen 200 Mg  Caps (Ibuprofen) .... As Needed 4)  Vardenafil Hcl 10 Mg Tabs (Vardenafil Hcl) .... One By Mouth  1/2 Prior 5)  Fish Oil Concentrate 1000 Mg Caps (Omega-3 Fatty Acids) .Marland Kitchen.. 1 By Mouth 1 Times A Day 6)  Cialis 20 Mg Tabs (Tadalafil) .... One By Mouth As Directed 7)  Atuss Ds 30-4-30 Mg/11ml Susp (Pseudoephed Hcl-Cpm-Dm Hbr Tan) .... 2 Teaspoons Q 12 Hours As Needed Cough. 8)  Cyclobenzaprine Hcl 5 Mg Tabs (Cyclobenzaprine Hcl) .... One By Mouth At Bedtime  Current Medications (verified): 1)  Lipitor 10  Mg  Tabs (Atorvastatin Calcium) .Marland Kitchen.. 1 Once Daily 2)  Claritin 10 Mg  Tabs (Loratadine) .... As Needed 3)  Ibuprofen 200 Mg  Caps (Ibuprofen) .... As Needed 4)  Cialis 20 Mg Tabs (Tadalafil) .... One By Mouth As Directed  Allergies: No Known Drug Allergies  Past History:  Family History: Last updated: 07/04/2007 father passed  at age 12  CAD?  mother had brain tumor Family History Other cancer  Social History: Last updated: 05/25/2007 Married Former Smoker  Risk Factors: Exercise: yes (07/04/2007)  Risk  Factors: Smoking Status: quit (08/19/2010) Passive Smoke Exposure: no (08/19/2010)  Past medical, surgical, family and social histories (including risk factors) reviewed, and no changes noted (except as noted below).  Past Medical History: Reviewed history from 05/25/2007 and no changes required. Allergic rhinitis Hyperlipidemia lymphoma--non Hodgkins bronchiectasis Osteoarthritis  Past Surgical History: Reviewed history from 03/31/2007 and no changes required. chemo 3CHOP/XRT (18)  2001 polyps 1999 Cholecystectomy ventral hernia  Family History: Reviewed history from 07/04/2007 and no changes required. father passed  at age 63  CAD?  mother had brain tumor Family History Other cancer  Social History: Reviewed history from 05/25/2007 and no changes required. Married Former Smoker  Review of Systems  The patient denies anorexia, fever, weight loss, weight gain, vision loss, decreased hearing, hoarseness, chest pain, syncope, dyspnea on exertion, peripheral edema, prolonged cough, headaches, hemoptysis, abdominal pain, melena, hematochezia, severe indigestion/heartburn, hematuria, incontinence, genital sores, muscle weakness, suspicious skin lesions, transient blindness, difficulty walking, depression, unusual weight change, abnormal bleeding, enlarged lymph nodes, angioedema, and breast masses.    Physical Exam  General:  Well-developed,well-nourished,in no acute distress; alert,appropriate and cooperative throughout examination Head:  Normocephalic and atraumatic without obvious abnormalities. No apparent alopecia or balding. Eyes:  pupils equal, pupils round, and pupils reactive to light.   Ears:  R ear normal and L ear normal.   Nose:  no external deformity and no nasal discharge.   Mouth:  Oral mucosa and oropharynx without lesions or exudates.  Teeth in good repair. Neck:  No deformities, masses, or tenderness noted. tender near attachment muscle to occipital  skull. Lungs:  Normal respiratory effort, chest expands symmetrically. Lungs are clear to auscultation, no crackles or wheezes. Heart:  Normal rate and regular rhythm. S1 and S2 normal without gallop, murmur, click, rub or other extra sounds. Abdomen:  Bowel sounds positive,abdomen soft and non-tender without masses, organomegaly or hernias noted. Msk:  No deformity or scoliosis noted of thoracic or lumbar spine.   Pulses:  R and L carotid,radial,femoral,dorsalis pedis and posterior tibial pulses are full and equal bilaterally Extremities:  no edema Neurologic:  alert & oriented X3, cranial nerves II-XII intact, strength normal in all extremities, gait normal, and finger-to-nose normal.     Impression & Recommendations:  Problem # 1:  HYPERLIPIDEMIA (ICD-272.4) Assessment Deteriorated discussion of need to take meds daily he misses several doses a week His updated medication list for this problem includes:    Lipitor 10 Mg Tabs (Atorvastatin calcium) .Marland Kitchen... 1 once daily  Labs Reviewed: SGOT: 27 (08/13/2010)   SGPT: 38 (08/13/2010)  Lipid Goals: Chol Goal: 200 (05/25/2007)   HDL Goal: 40 (05/25/2007)   LDL Goal: 160 (05/25/2007)   TG Goal: 150 (05/25/2007)  10 Yr Risk Heart Disease: 14 % Prior 10 Yr Risk Heart Disease: 6 % (10/10/2009)   HDL:50.40 (08/13/2010), 55.20 (02/05/2010)  LDL:109 (08/13/2010), 102 (02/05/2010)  Chol:169 (08/13/2010), 168 (02/05/2010)  Trig:50.0 (08/13/2010), 56.0 (02/05/2010)  Problem # 2:  ALLERGIC RHINITIS (ICD-477.9) Assessment: Unchanged  His updated medication list for this problem includes:    Claritin 10 Mg Tabs (Loratadine) .Marland Kitchen... As needed  Discussed use of allergy medications and environmental measures.   Problem # 3:  OSTEOARTHRITIS (ICD-715.90) Assessment: Improved  His updated medication list for this problem includes:    Ibuprofen 200 Mg Caps (Ibuprofen) .Marland Kitchen... As needed  Discussed use of medications, application of heat or cold, and  exercises.   Problem # 4:  Hx of NON-HODGKIN'S LYMPHOMA (ICD-202.80) Assessment: Unchanged  the pt has folow up with dr Truett Perna  Complete Medication List: 1)  Lipitor 10 Mg Tabs (Atorvastatin calcium) .Marland Kitchen.. 1 once daily 2)  Claritin 10 Mg Tabs (Loratadine) .... As needed 3)  Ibuprofen 200 Mg Caps (Ibuprofen) .... As needed 4)  Cialis 20 Mg Tabs (Tadalafil) .... One by mouth as directed  Lipid Assessment/Plan:      Based on NCEP/ATP III, the patient's risk factor category is "0-1 risk factors".  The patient's lipid goals are as follows: Total cholesterol goal is 200; LDL cholesterol goal is 160; HDL cholesterol goal is 40; Triglyceride goal is 150.    Patient Instructions: 1)  CPX in July  with labs prior Prescriptions: CIALIS 20 MG TABS (TADALAFIL) one by mouth as directed  #5 x 11   Entered by:   Willy Eddy, LPN   Authorized by:   Stacie Glaze MD   Signed by:   Willy Eddy, LPN on 43/32/9518   Method used:   Print then Give to Patient   RxID:   [id number]    Orders Added: 1)  Est. Patient Level IV [23557]

## 2010-09-03 NOTE — Letter (Signed)
Summary: [facility]  [facility]   Imported By: Maryln Gottron 07/14/2010 10:41:57  _____________________________________________________________________  External Attachment:    Type:   Image     Comment:   External Document

## 2010-10-14 ENCOUNTER — Telehealth: Payer: Self-pay | Admitting: *Deleted

## 2010-10-14 DIAGNOSIS — J40 Bronchitis, not specified as acute or chronic: Secondary | ICD-10-CM

## 2010-10-14 MED ORDER — LEVOFLOXACIN 500 MG PO TABS: 500.0000 mg | ORAL_TABLET | Freq: Every day | ORAL | Status: AC

## 2010-10-14 NOTE — Telephone Encounter (Signed)
Will obtain a chest x-ray the morning in the meanwhile we will place him on Levaquin other milligrams by mouth daily and stop the Z-Pak. Patient has been informed of the knee to the chest x-ray and the new antibiotic he is in agreement with this plan he'll call us if symptoms worsen or presented in urgent care emergency room if it is after hours

## 2010-10-14 NOTE — Telephone Encounter (Signed)
Pt has been sick since Sunday with aching all over and lethargic with fever.   Started Zpack that Dr. Caryl Never gave him a while ago but not better.  Is coughing up blood and feels like pneumonia.  Karin Golden Victoria Pisgah

## 2010-10-15 ENCOUNTER — Ambulatory Visit (INDEPENDENT_AMBULATORY_CARE_PROVIDER_SITE_OTHER)
Admission: RE | Admit: 2010-10-15 | Discharge: 2010-10-15 | Disposition: A | Discharge status: Home / Self Care | Payer: Self-pay | Source: Ambulatory Visit | Attending: Internal Medicine | Admitting: Internal Medicine

## 2010-10-15 DIAGNOSIS — R042 Hemoptysis: Secondary | ICD-10-CM

## 2010-10-30 ENCOUNTER — Telehealth: Payer: Self-pay | Admitting: *Deleted

## 2010-10-30 ENCOUNTER — Ambulatory Visit (INDEPENDENT_AMBULATORY_CARE_PROVIDER_SITE_OTHER): Payer: Medicare Other | Admitting: Internal Medicine

## 2010-10-30 ENCOUNTER — Encounter: Payer: Self-pay | Admitting: Internal Medicine

## 2010-10-30 VITALS — BP 122/80 | HR 68 | Temp 98.4°F | Resp 14 | Ht 71.0 in | Wt 186.0 lb

## 2010-10-30 DIAGNOSIS — R059 Cough, unspecified: Secondary | ICD-10-CM

## 2010-10-30 DIAGNOSIS — J209 Acute bronchitis, unspecified: Secondary | ICD-10-CM

## 2010-10-30 DIAGNOSIS — R05 Cough: Secondary | ICD-10-CM

## 2010-10-30 MED ORDER — AZITHROMYCIN 500 MG PO TABS: 500.0000 mg | ORAL_TABLET | Freq: Every day | ORAL | Status: AC

## 2010-10-30 MED ORDER — PHENYLEPHRINE-APAP-GUAIFENESIN 10-650-400 MG/20ML PO LIQD: 5.0000 mL | Freq: Four times a day (QID) | ORAL | Status: DC | PRN

## 2010-10-30 NOTE — Telephone Encounter (Signed)
Coughing (productive) green started back again last night.  No fever.  Diagnosed with pneumonia 2 weeks ago.

## 2010-10-30 NOTE — Patient Instructions (Signed)
Halls immune defence Zinc, vit c and echinasea

## 2010-10-30 NOTE — Telephone Encounter (Signed)
appoipntment made for this pm

## 2010-10-30 NOTE — Progress Notes (Signed)
  Subjective:    Patient ID: Eric Brock, male    DOB: Dec 09, 1940, 70 y.o.   MRN: [id number]  HPI Patient has a history of pneumonia also distant history of non-Hodgkin's lymphoma there is some radiation change to . He presents with a recent history of pneumonia and excessive treatment with antibiotics but having recurrent symptoms of cough and congestion for the 2 days. No fever no chills or notes any night sweats. Main symptom is fatigue and cough   Review of Systems  Constitutional: Negative for fever and fatigue.  HENT: Negative for hearing loss, congestion, neck pain and postnasal drip.   Eyes: Negative for discharge, redness and visual disturbance.  Respiratory: Negative for cough, shortness of breath and wheezing.   Cardiovascular: Negative for leg swelling.  Gastrointestinal: Negative for abdominal pain, constipation and abdominal distention.  Genitourinary: Negative for urgency and frequency.  Musculoskeletal: Negative for joint swelling and arthralgias.  Skin: Negative for color change and rash.  Neurological: Negative for weakness and light-headedness.  Hematological: Negative for adenopathy.  Psychiatric/Behavioral: Negative for behavioral problems.       Objective:   Physical Exam  Constitutional: He appears well-developed and well-nourished.  HENT:  Head: Normocephalic and atraumatic.  Eyes: Conjunctivae are normal. Pupils are equal, round, and reactive to light.  Neck: Normal range of motion. Neck supple.  Cardiovascular: Normal rate and regular rhythm.   Pulmonary/Chest: Effort normal and breath sounds normal.  Abdominal: Soft. Bowel sounds are normal.  Skin: Skin is warm.          Assessment & Plan:  Respiratory tract infection perhaps early bronchitis. Will recommend treatment for symptomatic control but do to his past history we'll give him a prescription for an antibiotic since the weekend is approaching should the symptoms worsen over the weekend.  Specifically should he develop a fever she develop a cough productive of sputum with a change color she develop auditory wheezing.

## 2010-12-07 ENCOUNTER — Telehealth: Payer: Self-pay | Admitting: *Deleted

## 2010-12-07 DIAGNOSIS — R053 Chronic cough: Secondary | ICD-10-CM

## 2010-12-07 DIAGNOSIS — R05 Cough: Secondary | ICD-10-CM

## 2010-12-07 NOTE — Telephone Encounter (Signed)
Pt informed he will be called with a time for his CT scan.

## 2010-12-07 NOTE — Telephone Encounter (Signed)
Ct of chest -dx chronic cough  And history of lymphoma Per dr Lovell Sheehan

## 2010-12-07 NOTE — Telephone Encounter (Signed)
Pt states he had never recovered from the pneumonia.  He is still congested and coughing up green mucus. No fever.

## 2010-12-14 ENCOUNTER — Other Ambulatory Visit (INDEPENDENT_AMBULATORY_CARE_PROVIDER_SITE_OTHER): Payer: Medicare Other | Admitting: Internal Medicine

## 2010-12-14 DIAGNOSIS — R059 Cough, unspecified: Secondary | ICD-10-CM

## 2010-12-14 DIAGNOSIS — R053 Chronic cough: Secondary | ICD-10-CM

## 2010-12-14 DIAGNOSIS — R05 Cough: Secondary | ICD-10-CM

## 2010-12-14 LAB — BASIC METABOLIC PANEL
CO2: 26 mEq/L (ref 19–32)
Calcium: 9.1 mg/dL (ref 8.4–10.5)
Chloride: 101 mEq/L (ref 96–112)
Creatinine, Ser: 0.9 mg/dL (ref 0.4–1.5)
Sodium: 134 mEq/L — ABNORMAL LOW (ref 135–145)

## 2010-12-17 ENCOUNTER — Ambulatory Visit (INDEPENDENT_AMBULATORY_CARE_PROVIDER_SITE_OTHER)
Admission: RE | Admit: 2010-12-17 | Discharge: 2010-12-17 | Disposition: A | Discharge status: Home / Self Care | Payer: Medicare Other | Source: Ambulatory Visit | Attending: Internal Medicine | Admitting: Internal Medicine

## 2010-12-17 DIAGNOSIS — R059 Cough, unspecified: Secondary | ICD-10-CM

## 2010-12-17 DIAGNOSIS — R053 Chronic cough: Secondary | ICD-10-CM

## 2010-12-17 DIAGNOSIS — R05 Cough: Secondary | ICD-10-CM

## 2010-12-17 MED ORDER — IOHEXOL 300 MG/ML  SOLN
100.0000 mL | Freq: Once | INTRAMUSCULAR | Status: AC | PRN
  Administered 2010-12-17: 80 mL via INTRAVENOUS

## 2010-12-23 ENCOUNTER — Telehealth: Payer: Self-pay | Admitting: *Deleted

## 2010-12-23 MED ORDER — ALBUTEROL SULFATE HFA 108 (90 BASE) MCG/ACT IN AERS: 2.0000 | INHALATION_SPRAY | Freq: Two times a day (BID) | RESPIRATORY_TRACT | Status: DC

## 2010-12-23 NOTE — Telephone Encounter (Signed)
Per dr Lauralyn Primes-  Pt informed- no active in infection- probable scarring and mucous plugs- start albuterol 2 puffs bid- pt informed and med sent in

## 2010-12-23 NOTE — Telephone Encounter (Signed)
Message copied by Noralee Stain on Wed Dec 23, 2010 10:04 AM ------      Message from: Noralee Stain      Created: Thu Dec 17, 2010  2:04 PM                   ----- Message -----         From: Rad Results In Interface         Sent: 12/17/2010   1:31 PM           To: Rolly Salter

## 2010-12-23 NOTE — Telephone Encounter (Signed)
Please call pt with CT scan results ASAP.

## 2011-01-15 ENCOUNTER — Telehealth: Payer: Self-pay | Admitting: *Deleted

## 2011-01-15 DIAGNOSIS — R05 Cough: Secondary | ICD-10-CM

## 2011-01-15 DIAGNOSIS — R053 Chronic cough: Secondary | ICD-10-CM

## 2011-01-15 NOTE — Telephone Encounter (Signed)
Dr Lovell Sheehan stated we could send to pu lmonary if he continues to c/o cough-referral was sent

## 2011-01-15 NOTE — Telephone Encounter (Signed)
Pt would like referral to Pulmonary, please.

## 2011-02-01 ENCOUNTER — Encounter: Payer: Self-pay | Admitting: Emergency Medicine

## 2011-02-02 ENCOUNTER — Ambulatory Visit (INDEPENDENT_AMBULATORY_CARE_PROVIDER_SITE_OTHER): Payer: Medicare Other | Admitting: Emergency Medicine

## 2011-02-02 ENCOUNTER — Encounter: Payer: Self-pay | Admitting: Emergency Medicine

## 2011-02-02 VITALS — BP 128/72 | HR 61 | Temp 98.3°F | Ht 70.0 in | Wt 181.2 lb

## 2011-02-02 DIAGNOSIS — R059 Cough, unspecified: Secondary | ICD-10-CM

## 2011-02-02 DIAGNOSIS — R05 Cough: Secondary | ICD-10-CM

## 2011-02-02 DIAGNOSIS — R053 Chronic cough: Secondary | ICD-10-CM | POA: Insufficient documentation

## 2011-02-02 NOTE — Assessment & Plan Note (Signed)
Suspect two components to his cough -- UA irritation due to allergic rhinitis (better now) AND bronchiectasis, mild by CT scan but possibly driving and prolonging periods of cough.   - no allergy regimen now, but will see him in January to plan what he will take during the allergy season, try to be proactive - If he develops recurrent cough w green mucous, will have him call, obtain sputum samples. If unable to get adequate sample then would recommend FOB with BAL - will need repeat CT scan at some point to follow the bronchiectasis.

## 2011-02-02 NOTE — Progress Notes (Signed)
  Subjective:    Patient ID: Eric Brock, male    DOB: 1941-01-13, 70 y.o.   MRN: [id number]  HPI    Review of Systems  Constitutional: Negative for fever, activity change, appetite change and fatigue.  HENT: Negative for congestion, rhinorrhea, sneezing, postnasal drip and sinus pressure.   Respiratory: Positive for cough (baseline, in the am - prod clear/white). Negative for chest tightness, shortness of breath, wheezing and stridor.   Cardiovascular: Negative for chest pain.  Musculoskeletal: Negative for back pain.       Objective:   Physical Exam        Assessment & Plan:

## 2011-02-02 NOTE — Progress Notes (Signed)
Subjective:    Patient ID: Eric Brock, male    DOB: [birth year], 70 y.o.   MRN: [id number]  HPI 70 yo man, former smoker 12 pk-yrs, hx reported bronchiectasis dx originally 1970's. Also with hx non-Hodgkins lymphoma, treated with . Referred for cough by Dr Lovell Sheehan.   Tells me that he began to have cough, prod of green sputum, beginning in Feb 2012. He may have had some nasal gtt, sinus disease in the Sping months. This has been a pattern for many yrs. He has documented bronchiectasis as above. His cough is much better, still happens some with clear mucous. Last abx finished 2 mo ago. He was started on SABA bid and prn - he thinks it has helped him.   Former Artist, worked administration.   Review of Systems  Constitutional: Positive for fever. Negative for activity change, appetite change and fatigue.  HENT: Positive for rhinorrhea, postnasal drip and sinus pressure. Negative for congestion and sneezing.   Respiratory: Positive for cough. Negative for chest tightness, shortness of breath, wheezing and stridor.   Cardiovascular: Positive for chest pain (R sided CP - "funny feeling").  Musculoskeletal: Negative for back pain.     Past Medical History  Diagnosis Date  . Allergy   . Hyperlipidemia   . Lymphoma   . Bronchiectasis   . Arthritis      Family History  Problem Relation Age of Onset  . Brain cancer Mother   . Heart disease Father      History   Social History  . Marital Status: Married    Spouse Name: N/A    Number of Children: N/A  . Years of Education: N/A   Occupational History  . retired    Social History Main Topics  . Smoking status: Former Smoker -- 1.0 packs/day for 12 years    Quit date: 08/03/1975  . Smokeless tobacco: Not on file  . Alcohol Use: Yes     1 glass of wine daily  . Drug Use: No  . Sexually Active: Yes   Other Topics Concern  . Not on file   Social History Narrative  . No narrative on file     No Known Allergies    Outpatient Prescriptions Prior to Visit  Medication Sig Dispense Refill  . albuterol (PROVENTIL HFA;VENTOLIN HFA) 108 (90 BASE) MCG/ACT inhaler Inhale 2 puffs into the lungs 2 (two) times daily.  3.7 Inhaler  3  . atorvastatin (LIPITOR) 10 MG tablet Take by mouth. 1/2 daily        . ibuprofen (ADVIL,MOTRIN) 200 MG tablet Take 200 mg by mouth every 6 (six) hours as needed.        . loratadine (CLARITIN) 10 MG tablet Take 10 mg by mouth as needed.        . tadalafil (CIALIS) 20 MG tablet Take 20 mg by mouth daily as needed.        Marland Kitchen Phenylephrine-APAP-Guaifenesin (MUCINEX FAST-MAX COLD & SINUS) 10-650-400 MG/20ML LIQD Take 5 mLs by mouth 4 (four) times daily as needed.             Objective:   Physical Exam Gen: Pleasant, well-nourished, in no distress,  normal affect  ENT: No lesions,  mouth clear,  oropharynx clear, no postnasal drip  Neck: No JVD, no TMG, no carotid bruits  Lungs: No use of accessory muscles, no dullness to percussion, clear without rales or rhonchi  Cardiovascular: RRR, heart sounds normal, no murmur or gallops, no peripheral  edema  Musculoskeletal: No deformities, no cyanosis or clubbing  Neuro: alert, non focal  Skin: Warm, no lesions or rash      CT CHEST WITH CONTRAST 12/07/10  Technique: Multidetector CT imaging of the chest was performed  following the standard protocol during bolus administration of  intravenous contrast.  Contrast: 80 ml Omnipaque-300.  Comparison: 10/15/2010. 10/05/2007.  Findings: There is no axillary adenopathy. Aorta and branch  vessels appear within normal limits aside from atherosclerosis.  Coronary artery atherosclerosis. No pericardial effusion. No  pleural effusion. Cholecystectomy. Pulmonary arteries appear  within normal limits. Scarring and / or atelectasis has increased  in both the lingula and right middle lobe, with associated  cylindrical bronchiectasis. The lower lobes are within normal  limits bilaterally  in the upper lobes excepting the lingula appear  normal.  No mass lesion is identified. Mild volume loss in the areas of  atelectasis/scarring. There is no mediastinal or hilar adenopathy  in this patient with a history of lymphoma. Retrocrural nodes are  within normal limits.  Coronal and sagittal reconstructed images demonstrate a right  diaphragmatic hernia containing a portion of the liver. No  complicating features.  IMPRESSION:  1. Mild progression of lingular and right middle lobes scarring and  atelectasis. Cylindrical bronchiectasis remains present. This may  relate to remote insult such as infection. Active atypical  infection is considered unlikely.  2. Tiny scattered pulmonary nodules under 4 mm are stable,  consistent with a benign etiology.  3. No lymphadenopathy.  4. Lateral right diaphragmatic hernia containing liver.   Assessment & Plan:  Chronic cough Suspect two components to his cough -- UA irritation due to allergic rhinitis (better now) AND bronchiectasis, mild by CT scan but possibly driving and prolonging periods of cough.   - no allergy regimen now, but will see him in January to plan what he will take during the allergy season, try to be proactive - If he develops recurrent cough w green mucous, will have him call, obtain sputum samples. If unable to get adequate sample then would recommend FOB with BAL - will need repeat CT scan at some point to follow the bronchiectasis.

## 2011-02-02 NOTE — Patient Instructions (Signed)
We will plan to follow up in January 2012 If you have recurrent cough, then please call our office so we can discuss getting sputum samples We may decide to repeat your CT scan in the future

## 2011-02-17 ENCOUNTER — Encounter: Payer: Self-pay | Admitting: Emergency Medicine

## 2011-02-17 ENCOUNTER — Ambulatory Visit (INDEPENDENT_AMBULATORY_CARE_PROVIDER_SITE_OTHER): Payer: Medicare Other | Admitting: Emergency Medicine

## 2011-02-17 ENCOUNTER — Telehealth: Payer: Self-pay | Admitting: Emergency Medicine

## 2011-02-17 VITALS — BP 120/82 | HR 73 | Temp 98.3°F | Ht 70.0 in | Wt 181.0 lb

## 2011-02-17 DIAGNOSIS — J479 Bronchiectasis, uncomplicated: Secondary | ICD-10-CM

## 2011-02-17 MED ORDER — DOXYCYCLINE HYCLATE 100 MG PO CAPS: 100.0000 mg | ORAL_CAPSULE | Freq: Two times a day (BID) | ORAL | Status: AC

## 2011-02-17 NOTE — Patient Instructions (Signed)
We will obtain a sputum sample  We will start doxycycline for 7 days Follow up in 3 -4 weeks We will discuss possible bronchoscopy if you continue to have cough or to see blood.

## 2011-02-17 NOTE — Assessment & Plan Note (Signed)
Flaring again after a URI - get sputum sample.  - start doxy x 7 days.  - consider CT and or FOB if not resolving.  - ROV 3 - 4 weeks

## 2011-02-17 NOTE — Telephone Encounter (Signed)
I spoke with the pt and advised to wait until appt. Carron Curie, CMA

## 2011-02-17 NOTE — Progress Notes (Signed)
Subjective:    Patient ID: Eric Brock, male    DOB: [birth year], 70 y.o.   MRN: [id number]  HPI 70 yo man, former smoker 12 pk-yrs, hx reported bronchiectasis dx originally 1970's. Also with hx non-Hodgkins lymphoma, treated with . Referred for cough by Dr Lovell Sheehan.   Tells me that he began to have cough, prod of green sputum, beginning in Feb 2012. He may have had some nasal gtt, sinus disease in the Sping months. This has been a pattern for many yrs. He has documented bronchiectasis as above. His cough is much better, still happens some with clear mucous. Last abx finished 2 mo ago. He was started on SABA bid and prn - he thinks it has helped him.   Former Artist, worked administration.   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 He believes that he caught a URI since last time, probably 10 days ago - led to recurrence of cough, dark phlegm, greenish, some blood streaks.  Has clear nasal gtt, URI symptoms, getting better.     Objective:   Physical Exam Gen: Pleasant, well-nourished, in no distress,  normal affect  ENT: No lesions,  mouth clear,  oropharynx clear, no postnasal drip  Neck: No JVD, no TMG, no carotid bruits  Lungs: No use of accessory muscles, no dullness to percussion, clear without rales or rhonchi  Cardiovascular: RRR, heart sounds normal, no murmur or gallops, no peripheral edema  Musculoskeletal: No deformities, no cyanosis or clubbing  Neuro: alert, non focal  Skin: Warm, no lesions or rash      CT CHEST WITH CONTRAST 12/07/10  Technique: Multidetector CT imaging of the chest was performed  following the standard protocol during bolus administration of  intravenous contrast.  Contrast: 80 ml Omnipaque-300.  Comparison: 10/15/2010. 10/05/2007.  Findings: There is no axillary adenopathy. Aorta and branch  vessels appear within normal limits aside from atherosclerosis.  Coronary artery atherosclerosis. No pericardial  effusion. No  pleural effusion. Cholecystectomy. Pulmonary arteries appear  within normal limits. Scarring and / or atelectasis has increased  in both the lingula and right middle lobe, with associated  cylindrical bronchiectasis. The lower lobes are within normal  limits bilaterally in the upper lobes excepting the lingula appear  normal.  No mass lesion is identified. Mild volume loss in the areas of  atelectasis/scarring. There is no mediastinal or hilar adenopathy  in this patient with a history of lymphoma. Retrocrural nodes are  within normal limits.  Coronal and sagittal reconstructed images demonstrate a right  diaphragmatic hernia containing a portion of the liver. No  complicating features.  IMPRESSION:  1. Mild progression of lingular and right middle lobes scarring and  atelectasis. Cylindrical bronchiectasis remains present. This may  relate to remote insult such as infection. Active atypical  infection is considered unlikely.  2. Tiny scattered pulmonary nodules under 4 mm are stable,  consistent with a benign etiology.  3. No lymphadenopathy.  4. Lateral right diaphragmatic hernia containing liver.   Assessment & Plan:  Chronic cough Suspect two components to his cough -- UA irritation due to allergic rhinitis (better now) AND bronchiectasis, mild by CT scan but possibly driving and prolonging periods of cough.   - no allergy regimen now, but will see him in January to plan what he will take during the allergy season, try to be proactive - If he develops recurrent cough w green mucous, will have him call, obtain sputum samples. If unable to get  adequate sample then would recommend FOB with BAL - will need repeat CT scan at some point to follow the bronchiectasis.

## 2011-02-18 ENCOUNTER — Other Ambulatory Visit: Payer: Medicare Other

## 2011-02-18 DIAGNOSIS — J479 Bronchiectasis, uncomplicated: Secondary | ICD-10-CM

## 2011-02-21 LAB — RESPIRATORY CULTURE OR RESPIRATORY AND SPUTUM CULTURE: Gram Stain: NONE SEEN

## 2011-02-22 ENCOUNTER — Other Ambulatory Visit (INDEPENDENT_AMBULATORY_CARE_PROVIDER_SITE_OTHER): Payer: Medicare Other

## 2011-02-22 DIAGNOSIS — R059 Cough, unspecified: Secondary | ICD-10-CM

## 2011-02-22 DIAGNOSIS — Z0000 Encounter for general adult medical examination without abnormal findings: Secondary | ICD-10-CM

## 2011-02-22 DIAGNOSIS — J479 Bronchiectasis, uncomplicated: Secondary | ICD-10-CM

## 2011-02-22 DIAGNOSIS — J320 Chronic maxillary sinusitis: Secondary | ICD-10-CM

## 2011-02-22 DIAGNOSIS — C8589 Other specified types of non-Hodgkin lymphoma, extranodal and solid organ sites: Secondary | ICD-10-CM

## 2011-02-22 DIAGNOSIS — Z79899 Other long term (current) drug therapy: Secondary | ICD-10-CM

## 2011-02-22 DIAGNOSIS — Z125 Encounter for screening for malignant neoplasm of prostate: Secondary | ICD-10-CM

## 2011-02-22 DIAGNOSIS — R05 Cough: Secondary | ICD-10-CM

## 2011-02-22 DIAGNOSIS — R972 Elevated prostate specific antigen [PSA]: Secondary | ICD-10-CM

## 2011-02-22 LAB — POCT URINALYSIS DIPSTICK
Blood, UA: NEGATIVE
Ketones, UA: NEGATIVE
Leukocytes, UA: NEGATIVE
Protein, UA: NEGATIVE
pH, UA: 7

## 2011-02-22 LAB — HEPATIC FUNCTION PANEL
Albumin: 4.2 g/dL (ref 3.5–5.2)
Alkaline Phosphatase: 63 U/L (ref 39–117)
Bilirubin, Direct: 0.1 mg/dL (ref 0.0–0.3)

## 2011-02-22 LAB — CBC WITH DIFFERENTIAL/PLATELET
Basophils Absolute: 0 10*3/uL (ref 0.0–0.1)
Eosinophils Absolute: 0.4 10*3/uL (ref 0.0–0.7)
Lymphocytes Relative: 27.5 % (ref 12.0–46.0)
MCHC: 33.5 g/dL (ref 30.0–36.0)
Monocytes Absolute: 0.4 10*3/uL (ref 0.1–1.0)
Neutrophils Relative %: 62.6 % (ref 43.0–77.0)
RDW: 13.8 % (ref 11.5–14.6)

## 2011-02-22 LAB — BASIC METABOLIC PANEL
BUN: 13 mg/dL (ref 6–23)
CO2: 28 mEq/L (ref 19–32)
Calcium: 9.2 mg/dL (ref 8.4–10.5)
Creatinine, Ser: 0.9 mg/dL (ref 0.4–1.5)
Glucose, Bld: 91 mg/dL (ref 70–99)

## 2011-02-22 LAB — LIPID PANEL
HDL: 52.1 mg/dL (ref >39.00)
Triglycerides: 97 mg/dL (ref 0.0–149.0)
VLDL: 19.4 mg/dL (ref 0.0–40.0)

## 2011-03-01 ENCOUNTER — Ambulatory Visit (INDEPENDENT_AMBULATORY_CARE_PROVIDER_SITE_OTHER): Payer: Medicare Other | Admitting: Internal Medicine

## 2011-03-01 ENCOUNTER — Encounter: Payer: Self-pay | Admitting: Internal Medicine

## 2011-03-01 DIAGNOSIS — C8589 Other specified types of non-Hodgkin lymphoma, extranodal and solid organ sites: Secondary | ICD-10-CM

## 2011-03-01 DIAGNOSIS — R053 Chronic cough: Secondary | ICD-10-CM

## 2011-03-01 DIAGNOSIS — R05 Cough: Secondary | ICD-10-CM

## 2011-03-01 DIAGNOSIS — R059 Cough, unspecified: Secondary | ICD-10-CM

## 2011-03-01 DIAGNOSIS — R972 Elevated prostate specific antigen [PSA]: Secondary | ICD-10-CM

## 2011-03-01 DIAGNOSIS — J479 Bronchiectasis, uncomplicated: Secondary | ICD-10-CM

## 2011-03-01 DIAGNOSIS — J320 Chronic maxillary sinusitis: Secondary | ICD-10-CM

## 2011-03-01 DIAGNOSIS — Z0000 Encounter for general adult medical examination without abnormal findings: Secondary | ICD-10-CM

## 2011-03-01 MED ORDER — DOXYCYCLINE HYCLATE 100 MG PO TABS: 100.0000 mg | ORAL_TABLET | Freq: Two times a day (BID) | ORAL | Status: AC

## 2011-03-01 NOTE — Progress Notes (Signed)
Subjective:    Patient ID: Eric Brock, male    DOB: [birth year], 70 y.o.   MRN: [id number]  HPI Patient is a 70 year old white male who presents for complete physical examination.  His acute complaints today involve a chronic cough with bronchiectasis and with a history of non-Hodgkin's lymphoma status post radiation therapy.  He has allergic rhinitis with nasal stuffiness he has been taking Zyrtec over-the-counter which has helped but it creates a sense of sedation.  He also has a slight elevation of PSA seen on his screening labs he was 2.1 it is 2.8.  We discussed the etiology of  the PSA I   Review of Systems  Constitutional: Negative for fever and fatigue.  HENT: Negative for hearing loss, congestion, neck pain and postnasal drip.   Eyes: Negative for discharge, redness and visual disturbance.  Respiratory: Negative for cough, shortness of breath and wheezing.   Cardiovascular: Negative for leg swelling.  Gastrointestinal: Negative for abdominal pain, constipation and abdominal distention.  Genitourinary: Negative for urgency and frequency.  Musculoskeletal: Negative for joint swelling and arthralgias.  Skin: Negative for color change and rash.  Neurological: Negative for weakness and light-headedness.  Hematological: Negative for adenopathy.  Psychiatric/Behavioral: Negative for behavioral problems.       Past Medical History  Diagnosis Date  . Allergy   . Hyperlipidemia   . Lymphoma   . Bronchiectasis   . Arthritis    Past Surgical History  Procedure Date  . Cervical polypectomy   . Cholecystectomy   . Hernia repair     reports that he quit smoking about 35 years ago. He does not have any smokeless tobacco history on file. He reports that he drinks alcohol. He reports that he does not use illicit drugs. family history includes Brain cancer in his mother and Heart disease in his father. No Known Allergies  Objective:   Physical Exam  Nursing note and  vitals reviewed. Constitutional: He is oriented to person, place, and time. He appears well-developed and well-nourished.       Male pattern balding  HENT:  Head: Normocephalic and atraumatic.  Eyes: Conjunctivae are normal. Pupils are equal, round, and reactive to light.  Neck: Normal range of motion. Neck supple.  Cardiovascular: Normal rate and regular rhythm.   Pulmonary/Chest: Effort normal and breath sounds normal.  Abdominal: Soft. Bowel sounds are normal.  Genitourinary:       Normal rectal tone prostate +1 moderately boggy I would estimated to be 45 g  Musculoskeletal: Normal range of motion.  Neurological: He is alert and oriented to person, place, and time.  Skin: Skin is warm and dry. No erythema.          Assessment & Plan:   Patient presents for yearly preventative medicine examination.   all immunizations and health maintenance protocols were reviewed with the patient and they are up to date with these protocols.   screening laboratory values were reviewed with the patient including screening of hyperlipidemia PSA renal function and hepatic function.   There medications past medical history social history problem list and allergies were reviewed in detail.   Goals were established with regard to weight loss exercise diet in compliance with medications   Spaces slight bump in the PSA we will treat this with doxycycline 100 mg by mouth twice a day for 21 days repeat PSA in 6 weeks and followup in 8 weeks.  Patient has No problem-specific assessment & plan notes found for this encounter.

## 2011-03-01 NOTE — Patient Instructions (Signed)
DO to the saline sinus lavage twice a day. You will take the antibiotics for 3 weeks we will repeat the PSA in 6 weeks and followup in 8 weeks

## 2011-03-12 ENCOUNTER — Encounter: Payer: Self-pay | Admitting: Emergency Medicine

## 2011-03-12 ENCOUNTER — Ambulatory Visit (INDEPENDENT_AMBULATORY_CARE_PROVIDER_SITE_OTHER): Payer: Medicare Other | Admitting: Emergency Medicine

## 2011-03-12 DIAGNOSIS — J309 Allergic rhinitis, unspecified: Secondary | ICD-10-CM

## 2011-03-12 DIAGNOSIS — J479 Bronchiectasis, uncomplicated: Secondary | ICD-10-CM

## 2011-03-12 MED ORDER — LORATADINE 10 MG PO TABS: 10.0000 mg | ORAL_TABLET | Freq: Every day | ORAL | Status: DC

## 2011-03-12 NOTE — Patient Instructions (Signed)
Please start doing nasal saline washes daily Start loratadine 10mg  daily Follow up with Dr Delton Coombes in November or sooner if you have any problems.

## 2011-03-12 NOTE — Assessment & Plan Note (Signed)
-   will treat allergic rhinitis, bronchitis seems to have resolved - consider FOB if we need to sample for cx - hold off on CT scan right now, likely will need to repeat in next yr

## 2011-03-12 NOTE — Progress Notes (Signed)
Subjective:    Patient ID: Eric Brock, male    DOB: September 14, 1940, 70 y.o.   MRN: [id number]  HPI 70 yo man, former smoker 12 pk-yrs, hx reported bronchiectasis dx originally 1970's. Also with hx non-Hodgkins lymphoma, treated with . Referred for cough by Dr Lovell Sheehan.   Tells me that he began to have cough, prod of green sputum, beginning in Feb 2012. He may have had some nasal gtt, sinus disease in the Sping months. This has been a pattern for many yrs. He has documented bronchiectasis as above. His cough is much better, still happens some with clear mucous. Last abx finished 2 mo ago. He was started on SABA bid and prn - he thinks it has helped him.   Former Artist, worked administration.   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 He believes that he caught a URI since last time, probably 10 days ago - led to recurrence of cough, dark phlegm, greenish, some blood streaks.  Has clear nasal gtt, URI symptoms, getting better.   ROV 03/12/11 -- f/u for cough in setting mild bronchiectasis and UA irritation syndrome. Treated 1 mo ago for bronchititis w URI. Treated w/ doxycycline, sputum and cough better. He is currently experiencing nasal congestion, some drainage. He has tried some NSW's and his wife's zyrtec. Also using some decongestants.     Objective:   Physical Exam Gen: Pleasant, well-nourished, in no distress,  normal affect  ENT: No lesions,  mouth clear,  oropharynx clear, no postnasal drip  Neck: No JVD, no TMG, no carotid bruits  Lungs: No use of accessory muscles, no dullness to percussion, clear without rales or rhonchi  Cardiovascular: RRR, heart sounds normal, no murmur or gallops, no peripheral edema  Musculoskeletal: No deformities, no cyanosis or clubbing  Neuro: alert, non focal  Skin: Warm, no lesions or rash      CT CHEST WITH CONTRAST 12/07/10  Technique: Multidetector CT imaging of the chest was performed  following the  standard protocol during bolus administration of  intravenous contrast.  Contrast: 80 ml Omnipaque-300.  Comparison: 10/15/2010. 10/05/2007.  Findings: There is no axillary adenopathy. Aorta and branch  vessels appear within normal limits aside from atherosclerosis.  Coronary artery atherosclerosis. No pericardial effusion. No  pleural effusion. Cholecystectomy. Pulmonary arteries appear  within normal limits. Scarring and / or atelectasis has increased  in both the lingula and right middle lobe, with associated  cylindrical bronchiectasis. The lower lobes are within normal  limits bilaterally in the upper lobes excepting the lingula appear  normal.  No mass lesion is identified. Mild volume loss in the areas of  atelectasis/scarring. There is no mediastinal or hilar adenopathy  in this patient with a history of lymphoma. Retrocrural nodes are  within normal limits.  Coronal and sagittal reconstructed images demonstrate a right  diaphragmatic hernia containing a portion of the liver. No  complicating features.  IMPRESSION:  1. Mild progression of lingular and right middle lobes scarring and  atelectasis. Cylindrical bronchiectasis remains present. This may  relate to remote insult such as infection. Active atypical  infection is considered unlikely.  2. Tiny scattered pulmonary nodules under 4 mm are stable,  consistent with a benign etiology.  3. No lymphadenopathy.  4. Lateral right diaphragmatic hernia containing liver.   Assessment & Plan:  ALLERGIC RHINITIS - start NSWs - start loratadine qd - decongestants prn  BRONCHIECTASIS - will treat allergic rhinitis, bronchitis seems to have resolved - consider  FOB if we need to sample for cx - hold off on CT scan right now, likely will need to repeat in next yr

## 2011-03-12 NOTE — Assessment & Plan Note (Signed)
-   start NSWs - start loratadine qd - decongestants prn

## 2011-04-02 LAB — AFB CULTURE WITH SMEAR (NOT AT ARMC)

## 2011-04-28 ENCOUNTER — Other Ambulatory Visit: Payer: Medicare Other

## 2011-04-28 DIAGNOSIS — R972 Elevated prostate specific antigen [PSA]: Secondary | ICD-10-CM

## 2011-05-05 ENCOUNTER — Encounter: Payer: Self-pay | Admitting: Internal Medicine

## 2011-05-05 ENCOUNTER — Ambulatory Visit (INDEPENDENT_AMBULATORY_CARE_PROVIDER_SITE_OTHER): Payer: Medicare Other | Admitting: Internal Medicine

## 2011-05-05 VITALS — BP 130/70 | HR 72 | Temp 98.2°F | Resp 16 | Ht 70.5 in | Wt 178.0 lb

## 2011-05-05 DIAGNOSIS — E785 Hyperlipidemia, unspecified: Secondary | ICD-10-CM

## 2011-05-05 DIAGNOSIS — R972 Elevated prostate specific antigen [PSA]: Secondary | ICD-10-CM

## 2011-05-05 DIAGNOSIS — M1990 Unspecified osteoarthritis, unspecified site: Secondary | ICD-10-CM

## 2011-05-05 DIAGNOSIS — J309 Allergic rhinitis, unspecified: Secondary | ICD-10-CM

## 2011-05-05 DIAGNOSIS — J479 Bronchiectasis, uncomplicated: Secondary | ICD-10-CM

## 2011-05-05 MED ORDER — DOXYCYCLINE HYCLATE 100 MG PO TABS: 100.0000 mg | ORAL_TABLET | Freq: Two times a day (BID) | ORAL | Status: AC

## 2011-05-05 NOTE — Patient Instructions (Signed)
Get over-the-counter saw palmetto and take it twice a day get the antibiotic filled and take it for the next 30 days twice a day as well

## 2011-05-05 NOTE — Assessment & Plan Note (Signed)
He is off the Claritin he is doing sinus lavage he is on a combination allergy medication per the pulmonologist

## 2011-05-05 NOTE — Progress Notes (Signed)
  Subjective:    Patient ID: Eric Brock, male    DOB: 12/25/1940, 70 y.o.   MRN: [id number]  HPI  Patient presents today after on physical examination his PSA was elevated by about 25% of his prior years.  A repeat PSA today shows continued elevation and a free PSA shows over 25% free PSA Today we had a lengthy face-to-face discussion of the elevation of PSA its implications treatment plans further testing plans and what is a reasonable conservative approach to an elevation of the PSA.  He also admits to some symptomology with nocturia x2  Review of Systems  Constitutional: Negative for fever and fatigue.  HENT: Negative for hearing loss, congestion, neck pain and postnasal drip.   Eyes: Negative for discharge, redness and visual disturbance.  Respiratory: Negative for cough, shortness of breath and wheezing.   Cardiovascular: Negative for leg swelling.  Gastrointestinal: Negative for abdominal pain, constipation and abdominal distention.  Genitourinary: Negative for urgency and frequency.  Musculoskeletal: Negative for joint swelling and arthralgias.  Skin: Negative for color change and rash.  Neurological: Negative for weakness and light-headedness.  Hematological: Negative for adenopathy.  Psychiatric/Behavioral: Negative for behavioral problems.   Past Medical History  Diagnosis Date  . Allergy   . Hyperlipidemia   . Lymphoma   . Bronchiectasis   . Arthritis    Past Surgical History  Procedure Date  . Cervical polypectomy   . Cholecystectomy   . Hernia repair     reports that he quit smoking about 35 years ago. He does not have any smokeless tobacco history on file. He reports that he drinks alcohol. He reports that he does not use illicit drugs. family history includes Brain cancer in his mother and Heart disease in his father. No Known Allergies     Objective:   Physical Exam  Nursing note and vitals reviewed. Constitutional: He appears well-developed and  well-nourished.  HENT:  Head: Normocephalic and atraumatic.  Eyes: Conjunctivae are normal. Pupils are equal, round, and reactive to light.  Neck: Normal range of motion. Neck supple.  Cardiovascular: Normal rate and regular rhythm.   Pulmonary/Chest: Effort normal and breath sounds normal.  Abdominal: Soft. Bowel sounds are normal.          Assessment & Plan:  The patient's PSA elevation was discussed in detail and decision was made to take doxycycline 1 by mouth twice a day for 30 days and repeat the PSA and free PSA in 3 months if the PSA continues to rise we'll refer to urology for consideration for biopsy at that time he understands the risks and benefits of this strategy and understands that at age 84 this may be a more appropriate pathway than going for immediate biopsies today.  We reviewed again the risks of prostate cancer are present with elevation of PSA but the benefit of monitoring the PSA with a benign or a less aggressive course may be more appropriate than radical surgery at this time. He is in agreement with the conservative approach.

## 2011-05-05 NOTE — Assessment & Plan Note (Signed)
Patient's cough has significantly improved without any wheezing.  He does have upper respiratory tract symptoms including congestion of the sinus passages.  He is using sinus lavage

## 2011-05-05 NOTE — Assessment & Plan Note (Addendum)
Arthritis stable

## 2011-05-05 NOTE — Assessment & Plan Note (Signed)
Slow rise in PSA with no dysuria and some obstructive symptoms the course may be benign prostatic hypertrophy and the free PSA percentage is somewhat reassuring.  Therefore after consulting together we have decided to do another three-month trial with a prolonged course of antibiotic doxycycline twice a day for 30 days and repeat the PSA and free PSA if the free PSA percentage is changing of the total PSA is increasing then we have agreed that the next appropriate urologist if the PSA stabilizes or reduce his then we will continue to monitor.  At the same time he has some symptoms of BPH and the use of saw palmetto herb is recommended

## 2011-05-12 ENCOUNTER — Telehealth: Payer: Self-pay | Admitting: *Deleted

## 2011-05-12 MED ORDER — AZITHROMYCIN 250 MG PO TABS: ORAL_TABLET | ORAL | Status: AC

## 2011-05-12 NOTE — Telephone Encounter (Signed)
Pt. Notified.

## 2011-05-12 NOTE — Telephone Encounter (Signed)
Pt went to Urgent Care this weekend with a sinus infection.  States his breathing was completely blocked through his nasal passages.  He was already on Doxycycline, and was given an injection of steroids.  Symptoms have come back, and is asking to see Dr. Lovell Sheehan today.

## 2011-05-12 NOTE — Telephone Encounter (Signed)
Per dr Leretha Pol- give z pack and mucinex fast max -cough and cold and take as directed

## 2011-05-12 NOTE — Telephone Encounter (Signed)
Pt is calling back to see if he will be able to see Dr. Lovell Sheehan or someone else today.  Would like to know ASAP what Dr Lovell Sheehan recommends.

## 2011-06-07 ENCOUNTER — Encounter: Payer: Self-pay | Admitting: Emergency Medicine

## 2011-06-07 ENCOUNTER — Ambulatory Visit (INDEPENDENT_AMBULATORY_CARE_PROVIDER_SITE_OTHER): Payer: Medicare Other | Admitting: Emergency Medicine

## 2011-06-07 DIAGNOSIS — J479 Bronchiectasis, uncomplicated: Secondary | ICD-10-CM

## 2011-06-07 DIAGNOSIS — J309 Allergic rhinitis, unspecified: Secondary | ICD-10-CM

## 2011-06-07 NOTE — Progress Notes (Signed)
Subjective:    Patient ID: Eric Brock, male    DOB: 1941/05/04, 70 y.o.   MRN: [id number]  HPI 70 yo man, former smoker 12 pk-yrs, hx reported bronchiectasis dx originally 1970's. Also with hx non-Hodgkins lymphoma, treated with . Referred for cough by Dr Lovell Sheehan.   Tells me that he began to have cough, prod of green sputum, beginning in Feb 2012. He may have had some nasal gtt, sinus disease in the Sping months. This has been a pattern for many yrs. He has documented bronchiectasis as above. His cough is much better, still happens some with clear mucous. Last abx finished 2 mo ago. He was started on SABA bid and prn - he thinks it has helped him.   Former Artist, worked administration.   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 He believes that he caught a URI since last time, probably 10 days ago - led to recurrence of cough, dark phlegm, greenish, some blood streaks.  Has clear nasal gtt, URI symptoms, getting better.   ROV 03/12/11 -- f/u for cough in setting mild bronchiectasis and UA irritation syndrome. Treated 1 mo ago for bronchititis w URI. Treated w/ doxycycline, sputum and cough better. He is currently experiencing nasal congestion, some drainage. He has tried some NSW's and his wife's zyrtec. Also using some decongestants.   ROV 06/07/11 -- cough, bronchiectasis. Last time we added NSW and loratadine. He has been on doxycycline for 2 months now for possible prostatitis, but which may have been helping chest secretions as well. Has had some nasal congestion that is almost completely occlusive since last visit - he felt that it cleared up some when he stopped [place]. He is taking the loratadine daily.  Nasal passages are clear now.  Taking nasonex x last 3 weeks. He has been treated x 2 with cortisone shots for nasal congestion.      Objective:   Physical Exam Gen: Pleasant, well-nourished, in no distress,  normal affect  ENT: No lesions,  mouth  clear,  oropharynx clear, no postnasal drip  Neck: No JVD, no TMG, no carotid bruits  Lungs: No use of accessory muscles, no dullness to percussion, clear without rales or rhonchi  Cardiovascular: RRR, heart sounds normal, no murmur or gallops, no peripheral edema  Musculoskeletal: No deformities, no cyanosis or clubbing  Neuro: alert, non focal  Skin: Warm, no lesions or rash      CT CHEST WITH CONTRAST 12/07/10  Technique: Multidetector CT imaging of the chest was performed  following the standard protocol during bolus administration of  intravenous contrast.  Contrast: 80 ml Omnipaque-300.  Comparison: 10/15/2010. 10/05/2007.  Findings: There is no axillary adenopathy. Aorta and branch  vessels appear within normal limits aside from atherosclerosis.  Coronary artery atherosclerosis. No pericardial effusion. No  pleural effusion. Cholecystectomy. Pulmonary arteries appear  within normal limits. Scarring and / or atelectasis has increased  in both the lingula and right middle lobe, with associated  cylindrical bronchiectasis. The lower lobes are within normal  limits bilaterally in the upper lobes excepting the lingula appear  normal.  No mass lesion is identified. Mild volume loss in the areas of  atelectasis/scarring. There is no mediastinal or hilar adenopathy  in this patient with a history of lymphoma. Retrocrural nodes are  within normal limits.  Coronal and sagittal reconstructed images demonstrate a right  diaphragmatic hernia containing a portion of the liver. No  complicating features.  IMPRESSION:  1. Mild  progression of lingular and right middle lobes scarring and  atelectasis. Cylindrical bronchiectasis remains present. This may  relate to remote insult such as infection. Active atypical  infection is considered unlikely.  2. Tiny scattered pulmonary nodules under 4 mm are stable,  consistent with a benign etiology.  3. No lymphadenopathy.  4. Lateral right  diaphragmatic hernia containing liver.   Assessment & Plan:  BRONCHIECTASIS Not active at this time.  - will plan for repeat Ct scan in 1 year - follow clinical exam  ALLERGIC RHINITIS - stop NSW for now, consider going back to these if he declines.  - loratadine and nasonex  On schedule

## 2011-06-07 NOTE — Assessment & Plan Note (Signed)
Not active at this time.  - will plan for repeat Ct scan in 1 year - follow clinical exam

## 2011-06-07 NOTE — Assessment & Plan Note (Signed)
-   stop NSW for now, consider going back to these if he declines.  - loratadine and nasonex  On schedule

## 2011-06-07 NOTE — Patient Instructions (Signed)
Continue your loratadine daily and Nasonex spray daily Stop doing nasal saline washes for now Try using mucinex to loosen up your cough if you have trouble clearing thick secretions.  We will repeat your CT scan in a year, or sooner if you develop any new symptoms.  Follow up with Dr Delton Coombes in 6 months or sooner if you have any problems.

## 2011-07-02 ENCOUNTER — Telehealth: Payer: Self-pay | Admitting: *Deleted

## 2011-07-02 NOTE — Telephone Encounter (Signed)
Pt informed milk thistle

## 2011-07-02 NOTE — Telephone Encounter (Signed)
Patient is calling because there was a mention of a patient taking a medication for lesions on his liver.  Eric Brock does not remember the name of the medication?

## 2011-07-09 ENCOUNTER — Telehealth: Payer: Self-pay | Admitting: Oncology

## 2011-07-09 ENCOUNTER — Ambulatory Visit (HOSPITAL_BASED_OUTPATIENT_CLINIC_OR_DEPARTMENT_OTHER): Payer: Medicare Other | Admitting: Oncology

## 2011-07-09 VITALS — BP 142/78 | HR 65 | Temp 97.1°F | Ht 69.3 in | Wt 177.3 lb

## 2011-07-09 DIAGNOSIS — K1370 Unspecified lesions of oral mucosa: Secondary | ICD-10-CM

## 2011-07-09 DIAGNOSIS — Z87898 Personal history of other specified conditions: Secondary | ICD-10-CM

## 2011-07-09 DIAGNOSIS — R972 Elevated prostate specific antigen [PSA]: Secondary | ICD-10-CM

## 2011-07-09 DIAGNOSIS — Z8572 Personal history of non-Hodgkin lymphomas: Secondary | ICD-10-CM

## 2011-07-09 DIAGNOSIS — J479 Bronchiectasis, uncomplicated: Secondary | ICD-10-CM

## 2011-07-09 NOTE — Telephone Encounter (Signed)
lmonvm advising the pt of his dec 2013 appt

## 2011-07-09 NOTE — Progress Notes (Signed)
OFFICE PROGRESS NOTE   INTERVAL HISTORY:   Eric Brock returns as scheduled. There are no palpable nodules at the scalp. He denies fever, night sweats, anorexia, and palpable lymph nodes. He has a good energy level. He reports recent problems with bronchiectasis and a sinus infection. He is being evaluating by Dr. Lovell Sheehan for an elevated PSA. He has noted a small erythematous lesion at the palate for the past several weeks.  Objective:  Vital signs in last 24 hours:  Blood pressure 142/78, pulse 65, temperature 97.1 F (36.2 C), temperature source Oral, height 5' 9.3" (1.76 m), weight 177 lb 4.8 oz (80.423 kg).    HEENT: Neck without mass. Oropharynx without mass. There is a 2-3 mm erythematous area at the left side of the palate. Lymphatics: No cervical, supraclavicular, axillary, or inguinal lymph nodes Resp: Few end inspiratory wheezes at the posterior base bilaterally. No respiratory distress. Cardio: Regular rate and rhythm GI: No hepatosplenomegaly Vascular: Left greater than right lower leg varicosities. No edema.  Skin: No evidence for recurrent lymphoma at the scalp.    Medications: I have reviewed the patient's current medications.  Assessment/Plan:  1. Stage IA non-Hodgkin's lymphoma of the scalp diagnosed in March of 2000.  2. Bronchiectasis  3. Report of a mildly elevated PSA-he will followup with Dr. Lovell Sheehan.  4. Small erythematous lesion at the left side of the palate-likely a benign finding.  Disposition:  He remains in clinical remission from non-Hodgkin's lymphoma. He would like to continue followup at the cancer Center. He will return for an office visit in one year. The palate lesion is likely a benign finding. He will seek medical attention if this area enlarges.   Eric Shutters, MD  07/09/2011  10:24 AM

## 2011-07-28 ENCOUNTER — Other Ambulatory Visit: Payer: Medicare Other

## 2011-07-28 DIAGNOSIS — R972 Elevated prostate specific antigen [PSA]: Secondary | ICD-10-CM

## 2011-07-28 LAB — PSA, TOTAL AND FREE: PSA: 2.65 ng/mL (ref <=4.00)

## 2011-08-03 HISTORY — PX: CATARACT EXTRACTION W/ INTRAOCULAR LENS  IMPLANT, BILATERAL: SHX1307

## 2011-08-04 ENCOUNTER — Ambulatory Visit (INDEPENDENT_AMBULATORY_CARE_PROVIDER_SITE_OTHER): Payer: Medicare Other | Admitting: Internal Medicine

## 2011-08-04 ENCOUNTER — Encounter: Payer: Self-pay | Admitting: Internal Medicine

## 2011-08-04 DIAGNOSIS — Z0000 Encounter for general adult medical examination without abnormal findings: Secondary | ICD-10-CM

## 2011-08-04 DIAGNOSIS — N529 Male erectile dysfunction, unspecified: Secondary | ICD-10-CM

## 2011-08-04 DIAGNOSIS — R972 Elevated prostate specific antigen [PSA]: Secondary | ICD-10-CM

## 2011-08-04 DIAGNOSIS — J42 Unspecified chronic bronchitis: Secondary | ICD-10-CM

## 2011-08-04 MED ORDER — TADALAFIL 20 MG PO TABS: 20.0000 mg | ORAL_TABLET | Freq: Every day | ORAL | Status: DC | PRN

## 2011-08-04 NOTE — Patient Instructions (Signed)
The patient is instructed to continue all medications as prescribed. Schedule followup with check out clerk upon leaving the clinic  

## 2011-08-04 NOTE — Progress Notes (Signed)
Subjective:    Patient ID: Eric Brock, male    DOB: Jun 20, 1941, 71 y.o.   MRN: [id number]  HPI  Patient is a 71 year old white male with a history of allergic rhinitis and chronic bronchitis followed by pulmonary and a history of lymphoma non-Hodgkin's type followed by oncology.  He has been stable from a standpoint of his non-Hodgkin's lymphoma and released by oncology for yearly surveillance his chronic bronchitis and sinusitis is followed by pulmonary and related to radiation therapy to his lungs and he had time off his lymphoma treatment.  He presents today after a elevated PSA was detected on routine physical and continued elevated over 3 months he was to do with her antibiotic course and presents today for review a free and total PSA  Review of Systems  Constitutional: Negative for fever and fatigue.  HENT: Negative for hearing loss, congestion, neck pain and postnasal drip.   Eyes: Negative for discharge, redness and visual disturbance.  Respiratory: Negative for cough, shortness of breath and wheezing.   Cardiovascular: Negative for leg swelling.  Gastrointestinal: Negative for abdominal pain, constipation and abdominal distention.  Genitourinary: Negative for urgency and frequency.  Musculoskeletal: Negative for joint swelling and arthralgias.  Skin: Negative for color change and rash.  Neurological: Negative for weakness and light-headedness.  Hematological: Negative for adenopathy.  Psychiatric/Behavioral: Negative for behavioral problems.   Past Medical History  Diagnosis Date  . Allergy   . Hyperlipidemia   . Lymphoma   . Bronchiectasis   . Arthritis     History   Social History  . Marital Status: Married    Spouse Name: N/A    Number of Children: N/A  . Years of Education: N/A   Occupational History  . retired    Social History Main Topics  . Smoking status: Former Smoker -- 1.0 packs/day for 12 years    Quit date: 08/03/1975  . Smokeless tobacco:  Not on file  . Alcohol Use: Yes     1 glass of wine daily  . Drug Use: No  . Sexually Active: Yes   Other Topics Concern  . Not on file   Social History Narrative  . No narrative on file    Past Surgical History  Procedure Date  . Cervical polypectomy   . Cholecystectomy   . Hernia repair     Family History  Problem Relation Age of Onset  . Brain cancer Mother   . Heart disease Father   . Cancer Brother     prostate    No Known Allergies  Current Outpatient Prescriptions on File Prior to Visit  Medication Sig Dispense Refill  . albuterol (PROVENTIL HFA;VENTOLIN HFA) 108 (90 BASE) MCG/ACT inhaler Inhale 2 puffs into the lungs 2 (two) times daily as needed.        Marland Kitchen atorvastatin (LIPITOR) 10 MG tablet Take by mouth. 1/2 daily        . ibuprofen (ADVIL,MOTRIN) 200 MG tablet Take 200 mg by mouth every 6 (six) hours as needed.        . loratadine (CLARITIN) 10 MG tablet Take 1 tablet (10 mg total) by mouth daily.  30 tablet  11  . phenylephrine (NASAL DECONGESTANT PE) 10 MG TABS Take 10 mg by mouth every 4 (four) hours as needed.          BP 136/80  Pulse 72  Temp 98.3 F (36.8 C)  Resp 16  Ht 5' 10.5" (1.791 m)  Wt 178 lb (80.74  kg)  BMI 25.18 kg/m2       Objective:   Physical Exam  Nursing note and vitals reviewed. Constitutional: He appears well-developed and well-nourished.  HENT:  Head: Normocephalic and atraumatic.  Eyes: Conjunctivae are normal. Pupils are equal, round, and reactive to light.  Neck: Normal range of motion. Neck supple.  Cardiovascular: Normal rate and regular rhythm.   Pulmonary/Chest: Effort normal and breath sounds normal.  Abdominal: Soft. Bowel sounds are normal.          Assessment & Plan:  Patient's free and total PSA indicate less risk and will monitor in 6 months his PSA total is down and his PSA 3 is increased discusses reassuring him that this was a positive prognostic factor.  His risk factors are his brother with  aggressive prostate cancer that is metastatic and his prior history of lymphoma. His chronic sinusitis and allergic rhinitis are stable with the treatment given by pulmonary and we reviewed with him the note from oncology about his post cancer status

## 2011-10-11 ENCOUNTER — Ambulatory Visit (INDEPENDENT_AMBULATORY_CARE_PROVIDER_SITE_OTHER): Payer: Medicare Other | Admitting: Family

## 2011-10-11 ENCOUNTER — Encounter: Payer: Self-pay | Admitting: Family

## 2011-10-11 VITALS — BP 140/70 | Temp 98.9°F | Ht 70.5 in | Wt 180.0 lb

## 2011-10-11 DIAGNOSIS — R059 Cough, unspecified: Secondary | ICD-10-CM

## 2011-10-11 DIAGNOSIS — R05 Cough: Secondary | ICD-10-CM

## 2011-10-11 DIAGNOSIS — J209 Acute bronchitis, unspecified: Secondary | ICD-10-CM

## 2011-10-11 DIAGNOSIS — J069 Acute upper respiratory infection, unspecified: Secondary | ICD-10-CM

## 2011-10-11 MED ORDER — DOXYCYCLINE HYCLATE 100 MG PO TABS: 100.0000 mg | ORAL_TABLET | Freq: Two times a day (BID) | ORAL | Status: AC

## 2011-10-11 MED ORDER — PREDNISONE 20 MG PO TABS: 60.0000 mg | ORAL_TABLET | Freq: Every day | ORAL | Status: AC

## 2011-10-11 NOTE — Progress Notes (Signed)
Subjective:    Patient ID: Eric Brock, male    DOB: 11/11/1940, 71 y.o.   MRN: [id number]  HPI Comments: C/o sinus drainage, nasal drainage, wheezing, productive cough with green tinged sputum, and fever and chills since Friday. Denies nausea, vomiting, dyspnea, or cp. Taking OTC mucinex without relief. Has h/o bronchioetasis and pneumonia. Is currently seen by pulmonologist. Last chest xray 6-8 months ago.   Cough Associated symptoms include chills, a fever, postnasal drip, rhinorrhea and wheezing. Pertinent negatives include no ear pain, sore throat or shortness of breath.  Fever  Associated symptoms include congestion, coughing and wheezing. Pertinent negatives include no ear pain or sore throat.      Review of Systems  Constitutional: Positive for fever and chills. Negative for diaphoresis, activity change, appetite change, fatigue and unexpected weight change.  HENT: Positive for congestion, facial swelling, rhinorrhea and postnasal drip. Negative for hearing loss, ear pain, sore throat, sneezing, trouble swallowing, neck pain, neck stiffness, voice change, sinus pressure, tinnitus and ear discharge.   Respiratory: Positive for cough and wheezing. Negative for apnea, choking, chest tightness, shortness of breath and stridor.   Cardiovascular: Negative.    Past Medical History  Diagnosis Date  . Allergy   . Hyperlipidemia   . Lymphoma   . Bronchiectasis   . Arthritis     History   Social History  . Marital Status: Married    Spouse Name: N/A    Number of Children: N/A  . Years of Education: N/A   Occupational History  . retired    Social History Main Topics  . Smoking status: Former Smoker -- 1.0 packs/day for 12 years    Quit date: 08/03/1975  . Smokeless tobacco: Not on file  . Alcohol Use: Yes     1 glass of wine daily  . Drug Use: No  . Sexually Active: Yes   Other Topics Concern  . Not on file   Social History Narrative  . No narrative on file     Past Surgical History  Procedure Date  . Cervical polypectomy   . Cholecystectomy   . Hernia repair     Family History  Problem Relation Age of Onset  . Brain cancer Mother   . Heart disease Father   . Cancer Brother     prostate    No Known Allergies  Current Outpatient Prescriptions on File Prior to Visit  Medication Sig Dispense Refill  . albuterol (PROVENTIL HFA;VENTOLIN HFA) 108 (90 BASE) MCG/ACT inhaler Inhale 2 puffs into the lungs 2 (two) times daily as needed.        Marland Kitchen atorvastatin (LIPITOR) 10 MG tablet Take by mouth. 1/2 daily        . ibuprofen (ADVIL,MOTRIN) 200 MG tablet Take 200 mg by mouth every 6 (six) hours as needed.        . loratadine (CLARITIN) 10 MG tablet Take 1 tablet (10 mg total) by mouth daily.  30 tablet  11  . phenylephrine (NASAL DECONGESTANT PE) 10 MG TABS Take 10 mg by mouth every 4 (four) hours as needed.        . tadalafil (CIALIS) 20 MG tablet Take 1 tablet (20 mg total) by mouth daily as needed.  10 tablet  6    BP 140/70  Temp(Src) 98.9 F (37.2 C) (Oral)  Ht 5' 10.5" (1.791 m)  Wt 180 lb (81.647 kg)  BMI 25.46 kg/m2chart    Objective:   Physical Exam  Constitutional: He is  oriented to person, place, and time. He appears well-developed and well-nourished. No distress.  Neck: No JVD present.  Cardiovascular: Normal rate, regular rhythm, normal heart sounds and intact distal pulses.  Exam reveals no gallop and no friction rub.   No murmur heard. Pulmonary/Chest: Effort normal. No stridor. No respiratory distress. He has wheezes. He has no rales. He exhibits no tenderness.  Neurological: He is alert and oriented to person, place, and time.  Skin: Skin is warm and dry. He is not diaphoretic.          Assessment & Plan:  Assessment: URI, bronchitis, rhinitis Plan: Doxycycline, prednisone, rest, increase po fluids. Teaching handouts provided. Encouraged to RTC if s/s get worse or do not begin to resolve with medications in a  week. Instructed to take all antibiotics and prednisone even if s/s resolve

## 2011-10-11 NOTE — Patient Instructions (Signed)

## 2011-11-04 ENCOUNTER — Telehealth: Payer: Self-pay

## 2011-11-04 NOTE — Telephone Encounter (Signed)
OTC Mucinex or [place] HBP as directed

## 2011-11-04 NOTE — Telephone Encounter (Signed)
resent

## 2011-11-04 NOTE — Telephone Encounter (Signed)
Pt was seen on 10/11/11 and given an rx for prednisone and antibiotics.  Pt states he thought it had cleared up but is now experiencing some of the same symptoms.Pt states he is not coughing but does have congestion.  Pt would like to know if more antibotics are needed.  Pls advise.

## 2011-11-05 MED ORDER — AMOXICILLIN-POT CLAVULANATE 875-125 MG PO TABS: 1.0000 | ORAL_TABLET | Freq: Two times a day (BID) | ORAL | Status: AC

## 2011-11-05 NOTE — Telephone Encounter (Signed)
Please call in meds.

## 2011-11-05 NOTE — Telephone Encounter (Signed)
Pt called to check on status of getting med. Pt checked with Eric Brock on [place] [address] [place] last night, and nothing has been sent in or called in yet. Pls call in asap today. Pt would like to be notified when this has been called in.

## 2011-11-05 NOTE — Telephone Encounter (Signed)
Spoke with pt. abx sent to pharmacy

## 2011-12-07 ENCOUNTER — Encounter: Payer: Self-pay | Admitting: Emergency Medicine

## 2011-12-07 ENCOUNTER — Ambulatory Visit (INDEPENDENT_AMBULATORY_CARE_PROVIDER_SITE_OTHER): Payer: Medicare Other | Admitting: Emergency Medicine

## 2011-12-07 VITALS — BP 108/66 | HR 60 | Temp 98.1°F | Ht 70.0 in | Wt 184.0 lb

## 2011-12-07 DIAGNOSIS — J479 Bronchiectasis, uncomplicated: Secondary | ICD-10-CM

## 2011-12-07 DIAGNOSIS — J309 Allergic rhinitis, unspecified: Secondary | ICD-10-CM

## 2011-12-07 MED ORDER — MOMETASONE FUROATE 50 MCG/ACT NA SUSP: 2.0000 | Freq: Every day | NASAL | Status: DC

## 2011-12-07 NOTE — Assessment & Plan Note (Addendum)
-   continue the loratadine or zyrtec and nasonex

## 2011-12-07 NOTE — Assessment & Plan Note (Signed)
-   will treat allergies.  - need to repeat CT scan at some point in the future. Will decide on timing next time.

## 2011-12-07 NOTE — Progress Notes (Signed)
Subjective:    Patient ID: Eric Brock, male    DOB: [birth year], 71 y.o.   MRN: [id number] HPI 71 yo man, former smoker 12 pk-yrs, hx reported bronchiectasis dx originally 1970's. Also with hx non-Hodgkins lymphoma, treated with . Referred for cough by Dr Lovell Sheehan.   Tells me that Eric Brock began to have cough, prod of green sputum, beginning in Feb 2012. Eric Brock may have had some nasal gtt, sinus disease in the Sping months. This has been a pattern for many yrs. Eric Brock has documented bronchiectasis as above. His cough is much better, still happens some with clear mucous. Last abx finished 2 mo ago. Eric Brock was started on SABA bid and prn - Eric Brock thinks it has helped him.   Former Artist, worked administration.   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 Eric Brock believes that Eric Brock caught a URI since last time, probably 10 days ago - led to recurrence of cough, dark phlegm, greenish, some blood streaks.  Has clear nasal gtt, URI symptoms, getting better.   ROV 03/12/11 -- f/u for cough in setting mild bronchiectasis and UA irritation syndrome. Treated 1 mo ago for bronchititis w URI. Treated w/ doxycycline, sputum and cough better. Eric Brock is currently experiencing nasal congestion, some drainage. Eric Brock has tried some NSW's and his wife's zyrtec. Also using some decongestants.   ROV 06/07/11 -- cough, bronchiectasis. Last time we added NSW and loratadine. Eric Brock has been on doxycycline for 2 months now for possible prostatitis, but which may have been helping chest secretions as well. Has had some nasal congestion that is almost completely occlusive since last visit - Eric Brock felt that it cleared up some when Eric Brock stopped [place]. Eric Brock is taking the loratadine daily.  Nasal passages are clear now.  Taking nasonex x last 3 weeks. Eric Brock has been treated x 2 with cortisone shots for nasal congestion.   ROV 12/07/11 -- cough, bronchiectasis. Has been having trouble lately during the allergy sx. Twice since last time has been  treated with pred once, abx twice in the last 3 months. Eric Brock is taking loratadine and nasonex, uses mucinex. Now close to his baseline.      Objective:   Physical Exam Gen: Pleasant, well-nourished, in no distress,  normal affect  ENT: No lesions,  mouth clear,  oropharynx clear, no postnasal drip  Neck: No JVD, no TMG, no carotid bruits  Lungs: No use of accessory muscles, no dullness to percussion, clear without rales or rhonchi  Cardiovascular: RRR, heart sounds normal, no murmur or gallops, no peripheral edema  Musculoskeletal: No deformities, no cyanosis or clubbing  Neuro: alert, non focal  Skin: Warm, no lesions or rash      CT CHEST WITH CONTRAST 12/07/10  Technique: Multidetector CT imaging of the chest was performed  following the standard protocol during bolus administration of  intravenous contrast.  Contrast: 80 ml Omnipaque-300.  Comparison: 10/15/2010. 10/05/2007.  Findings: There is no axillary adenopathy. Aorta and branch  vessels appear within normal limits aside from atherosclerosis.  Coronary artery atherosclerosis. No pericardial effusion. No  pleural effusion. Cholecystectomy. Pulmonary arteries appear  within normal limits. Scarring and / or atelectasis has increased  in both the lingula and right middle lobe, with associated  cylindrical bronchiectasis. The lower lobes are within normal  limits bilaterally in the upper lobes excepting the lingula appear  normal.  No mass lesion is identified. Mild volume loss in the areas of  atelectasis/scarring. There is no mediastinal or  hilar adenopathy  in this patient with a history of lymphoma. Retrocrural nodes are  within normal limits.  Coronal and sagittal reconstructed images demonstrate a right  diaphragmatic hernia containing a portion of the liver. No  complicating features.  IMPRESSION:  1. Mild progression of lingular and right middle lobes scarring and  atelectasis. Cylindrical bronchiectasis  remains present. This may  relate to remote insult such as infection. Active atypical  infection is considered unlikely.  2. Tiny scattered pulmonary nodules under 4 mm are stable,  consistent with a benign etiology.  3. No lymphadenopathy.  4. Lateral right diaphragmatic hernia containing liver.   Assessment & Plan:  ALLERGIC RHINITIS - continue the loratadine or zyrtec and nasonex  BRONCHIECTASIS - will treat allergies.  - need to repeat CT scan at some point in the future. Will decide on timing next time.

## 2011-12-07 NOTE — Patient Instructions (Signed)
Please continue loratadine 10mg  daily nasonex 2 sprays each side daily Follow with Dr Delton Coombes in 4 months Call our office if you have any problems.

## 2012-02-01 ENCOUNTER — Ambulatory Visit: Payer: Medicare Other | Admitting: Internal Medicine

## 2012-02-21 ENCOUNTER — Other Ambulatory Visit (INDEPENDENT_AMBULATORY_CARE_PROVIDER_SITE_OTHER): Payer: Medicare Other

## 2012-02-21 DIAGNOSIS — Z0000 Encounter for general adult medical examination without abnormal findings: Secondary | ICD-10-CM

## 2012-02-21 DIAGNOSIS — R972 Elevated prostate specific antigen [PSA]: Secondary | ICD-10-CM

## 2012-02-21 DIAGNOSIS — E785 Hyperlipidemia, unspecified: Secondary | ICD-10-CM

## 2012-02-21 LAB — TSH: TSH: 1.63 u[IU]/mL (ref 0.35–5.50)

## 2012-02-21 LAB — POCT URINALYSIS DIPSTICK
Bilirubin, UA: NEGATIVE
Glucose, UA: NEGATIVE
Ketones, UA: NEGATIVE
Leukocytes, UA: NEGATIVE
Nitrite, UA: NEGATIVE

## 2012-02-21 LAB — HEPATIC FUNCTION PANEL
ALT: 17 U/L (ref 0–53)
AST: 19 U/L (ref 0–37)
Albumin: 4.3 g/dL (ref 3.5–5.2)
Alkaline Phosphatase: 61 U/L (ref 39–117)
Total Protein: 7.1 g/dL (ref 6.0–8.3)

## 2012-02-21 LAB — CBC WITH DIFFERENTIAL/PLATELET
Basophils Relative: 0.5 % (ref 0.0–3.0)
Eosinophils Relative: 3.1 % (ref 0.0–5.0)
HCT: 45.2 % (ref 39.0–52.0)
Lymphs Abs: 2.4 10*3/uL (ref 0.7–4.0)
MCV: 94.5 fl (ref 78.0–100.0)
Monocytes Absolute: 0.6 10*3/uL (ref 0.1–1.0)
Monocytes Relative: 5.9 % (ref 3.0–12.0)
RBC: 4.78 Mil/uL (ref 4.22–5.81)
WBC: 9.4 10*3/uL (ref 4.5–10.5)

## 2012-02-21 LAB — BASIC METABOLIC PANEL
BUN: 20 mg/dL (ref 6–23)
CO2: 28 mEq/L (ref 19–32)
Chloride: 106 mEq/L (ref 96–112)
Glucose, Bld: 105 mg/dL — ABNORMAL HIGH (ref 70–99)
Potassium: 4.9 mEq/L (ref 3.5–5.1)

## 2012-02-21 LAB — LIPID PANEL: Cholesterol: 191 mg/dL (ref 0–200)

## 2012-03-01 ENCOUNTER — Encounter: Payer: Medicare Other | Admitting: Internal Medicine

## 2012-03-08 ENCOUNTER — Ambulatory Visit (INDEPENDENT_AMBULATORY_CARE_PROVIDER_SITE_OTHER): Payer: Medicare Other | Admitting: Internal Medicine

## 2012-03-08 ENCOUNTER — Encounter: Payer: Self-pay | Admitting: Internal Medicine

## 2012-03-08 VITALS — BP 122/78 | HR 68 | Temp 98.1°F | Resp 16 | Ht 70.0 in | Wt 176.0 lb

## 2012-03-08 DIAGNOSIS — N529 Male erectile dysfunction, unspecified: Secondary | ICD-10-CM

## 2012-03-08 DIAGNOSIS — N138 Other obstructive and reflux uropathy: Secondary | ICD-10-CM

## 2012-03-08 DIAGNOSIS — N401 Enlarged prostate with lower urinary tract symptoms: Secondary | ICD-10-CM

## 2012-03-08 DIAGNOSIS — N139 Obstructive and reflux uropathy, unspecified: Secondary | ICD-10-CM

## 2012-03-08 DIAGNOSIS — Z0000 Encounter for general adult medical examination without abnormal findings: Secondary | ICD-10-CM

## 2012-03-08 MED ORDER — SILDENAFIL CITRATE 100 MG PO TABS: 50.0000 mg | ORAL_TABLET | Freq: Every day | ORAL | Status: DC | PRN

## 2012-03-08 MED ORDER — SAW PALMETTO (SERENOA REPENS) 80 MG PO CAPS: 80.0000 mg | ORAL_CAPSULE | Freq: Two times a day (BID) | ORAL | Status: DC

## 2012-03-08 NOTE — Progress Notes (Signed)
Subjective:    Patient ID: Eric Brock, male    DOB: [birth year], 71 y.o.   MRN: [id number]  HPI is a 71 year old male presents for complete physical examination.  He has a history of non-Hodgkin's lymphoma and history of radiation therapy he is followed by pulmonary as well as by ourselves for chronic problems as well as today for an acute wellness examination.  He had blood work done in advance of the visit including a urinalysis a basic metabolic panel liver panel and cholesterol panel PSA and thyroid panel.  Generally his blood work was excellent with a normal hemoglobin and hematocrit are normal TSH normal urinalysis good renal function liver function normal and excellent cholesterol readings with a total cholesterol 191 HDL or good cholesterol 50 and an LDL or bad cholesterol of 784.      Review of Systems  Constitutional: Negative for fever and fatigue.  HENT: Negative for hearing loss, congestion, neck pain and postnasal drip.   Eyes: Negative for discharge, redness and visual disturbance.  Respiratory: Negative for cough, shortness of breath and wheezing.   Cardiovascular: Negative for leg swelling.  Gastrointestinal: Negative for abdominal pain, constipation and abdominal distention.  Genitourinary: Negative for urgency and frequency.  Musculoskeletal: Negative for joint swelling and arthralgias.  Skin: Negative for color change and rash.  Neurological: Negative for weakness and light-headedness.  Hematological: Negative for adenopathy.  Psychiatric/Behavioral: Negative for behavioral problems.   Past Medical History  Diagnosis Date  . Allergy   . Hyperlipidemia   . Lymphoma   . Bronchiectasis   . Arthritis     History   Social History  . Marital Status: Married    Spouse Name: N/A    Number of Children: N/A  . Years of Education: N/A   Occupational History  . retired    Social History Main Topics  . Smoking status: Former Smoker -- 1.0 packs/day for  12 years    Quit date: 08/03/1975  . Smokeless tobacco: Not on file  . Alcohol Use: Yes     1 glass of wine daily  . Drug Use: No  . Sexually Active: Yes   Other Topics Concern  . Not on file   Social History Narrative  . No narrative on file    Past Surgical History  Procedure Date  . Cervical polypectomy   . Cholecystectomy   . Hernia repair     Family History  Problem Relation Age of Onset  . Brain cancer Mother   . Heart disease Father   . Cancer Brother     prostate    No Known Allergies  Current Outpatient Prescriptions on File Prior to Visit  Medication Sig Dispense Refill  . albuterol (PROVENTIL HFA;VENTOLIN HFA) 108 (90 BASE) MCG/ACT inhaler Inhale 2 puffs into the lungs 2 (two) times daily as needed.        Marland Kitchen atorvastatin (LIPITOR) 10 MG tablet Take by mouth. 1/2 daily        . guaiFENesin (MUCINEX) 600 MG 12 hr tablet Take 600 mg by mouth 2 (two) times daily.      Marland Kitchen ibuprofen (ADVIL,MOTRIN) 200 MG tablet Take 200 mg by mouth every 6 (six) hours as needed.        . loratadine (CLARITIN) 10 MG tablet Take 1 tablet (10 mg total) by mouth daily.  30 tablet  11  . mometasone (NASONEX) 50 MCG/ACT nasal spray Place 2 sprays into the nose daily.  17 g  11  .  tadalafil (CIALIS) 20 MG tablet Take 1 tablet (20 mg total) by mouth daily as needed.  10 tablet  6    BP 122/78  Pulse 68  Temp 98.1 F (36.7 C)  Resp 16  Ht 5\' 10"  (1.778 m)  Wt 176 lb (79.833 kg)  BMI 25.25 kg/m2       Objective:   Physical Exam  Nursing note and vitals reviewed. Constitutional: He is oriented to person, place, and time. He appears well-developed and well-nourished.  HENT:  Head: Normocephalic and atraumatic.  Eyes: Conjunctivae are normal. Pupils are equal, round, and reactive to light.  Neck: Normal range of motion. Neck supple.  Cardiovascular: Normal rate and regular rhythm.   Pulmonary/Chest: Effort normal and breath sounds normal.  Abdominal: Soft. Bowel sounds are  normal.  Genitourinary: Rectum normal and prostate normal.       ENLARGED  Musculoskeletal: Normal range of motion.  Neurological: He is alert and oriented to person, place, and time.  Skin: Skin is warm and dry.          Assessment & Plan:   Patient presents for yearly preventative medicine examination.   all immunizations and health maintenance protocols were reviewed with the patient and they are up to date with these protocols.   screening laboratory values were reviewed with the patient including screening of hyperlipidemia PSA renal function and hepatic function.   There medications past medical history social history problem list and allergies were reviewed in detail.   Goals were established with regard to weight loss exercise diet in compliance with medications   Increased nocturia and BPH  Trial of saw palmeto

## 2012-03-08 NOTE — Patient Instructions (Signed)
The patient is instructed to continue all medications as prescribed. Schedule followup with check out clerk upon leaving the clinic  

## 2012-03-21 ENCOUNTER — Encounter: Payer: Self-pay | Admitting: Pulmonary Disease

## 2012-03-21 ENCOUNTER — Ambulatory Visit (INDEPENDENT_AMBULATORY_CARE_PROVIDER_SITE_OTHER): Payer: Medicare Other | Admitting: Pulmonary Disease

## 2012-03-21 VITALS — BP 142/80 | HR 65 | Temp 98.0°F | Ht 70.0 in | Wt 179.8 lb

## 2012-03-21 DIAGNOSIS — R053 Chronic cough: Secondary | ICD-10-CM

## 2012-03-21 DIAGNOSIS — R059 Cough, unspecified: Secondary | ICD-10-CM

## 2012-03-21 DIAGNOSIS — R05 Cough: Secondary | ICD-10-CM

## 2012-03-21 NOTE — Assessment & Plan Note (Signed)
The patient is having worsening cough at night that is probably related to his allergic rhinitis with severe postnasal drip.  He does not have any history to suggest acute sinusitis, nor does his history suggest a bronchiectasis flare.  I would like to treat him with a more aggressive antihistamine, and have also recommended a nasal hygiene regimen with rinses.  He is to call is if he does not improve, or if he begins to have worsening chest symptoms with purulence.

## 2012-03-21 NOTE — Patient Instructions (Addendum)
Continue with nasal spray Stop loratadine, and start chlorpheniramine 4mg  2 at bedtime each night to see if helps postnasal drip and cough Try neilmed sinus rinses at night before bedtime until better. Please call if worsening symptoms, and keep upcoming followup with Dr. Delton Coombes.

## 2012-03-21 NOTE — Progress Notes (Signed)
  Subjective:    Patient ID: Eric Brock, male    DOB: [birth year], 71 y.o.   MRN: [id number]  HPI The patient comes in today for an acute sick visit related to nocturnal cough and worsening postnasal drip.  He has a history of allergic rhinitis as well as bronchiectasis, but has a chronic intermittent cough that is related primarily to his postnasal drip.  He has seen worsening of his allergy symptoms over the last 10 days, and is waking up at night with significant postnasal drip and coughing episodes.  He denies any sinus pressure or significant congestion.  He has had no purulence from his nose.  He will bring up discolored mucus on rare occasions from his chest, but does not feel congested in his chest, nor does he have shortness of breath.  He has not had any fever.   Review of Systems  Constitutional: Negative for fever and unexpected weight change.  HENT: Positive for congestion and sneezing. Negative for ear pain, nosebleeds, sore throat, rhinorrhea, trouble swallowing, dental problem, postnasal drip and sinus pressure.   Eyes: Negative for redness and itching.  Respiratory: Positive for cough. Negative for chest tightness, shortness of breath and wheezing.   Cardiovascular: Negative for palpitations and leg swelling.  Gastrointestinal: Negative for nausea and vomiting.  Genitourinary: Negative for dysuria.  Musculoskeletal: Negative for joint swelling.  Skin: Negative for rash.  Neurological: Negative for headaches.  Hematological: Does not bruise/bleed easily.  Psychiatric/Behavioral: Negative for dysphoric mood. The patient is not nervous/anxious.   All other systems reviewed and are negative.       Objective:   Physical Exam Well-developed male in no acute distress Nose with mild edema of mucosa, and nonpurulent secretions Oropharynx clear Chest with a few scattered crackles, but no wheezes or rhonchi Cardiac exam with regular rate and rhythm Lower extremities without  edema, no cyanosis Alert and oriented, moves all 4 extremities.       Assessment & Plan:

## 2012-03-28 ENCOUNTER — Telehealth: Payer: Self-pay | Admitting: Emergency Medicine

## 2012-03-28 MED ORDER — MOXIFLOXACIN HCL 400 MG PO TABS: 400.0000 mg | ORAL_TABLET | Freq: Every day | ORAL | Status: DC

## 2012-03-28 NOTE — Telephone Encounter (Signed)
Ok for avelox 400 qd x 7 days, no RF

## 2012-03-28 NOTE — Telephone Encounter (Signed)
I spoke with pt and he c/o cough w/ green to light brown phlem x couple days (was clear phlem), wheezing, PND, and slight chest tx. He was up last night coughing for about 3 hrs. He has been taking chlorpheniramine 4 mg 2 at bedtime and doing neil med sinus rinses daily. He saw KC on 03/21/12 and has pending OV w/ RB on 04/06/12. I offered appt for today but refused and stated he did not feel like he needed to be seen. He is going out of town tomorrow and won't be back until last Friday night. Pt is requesting to having something called in for him. Please advise Dr. Delton Coombes thanks  No Known Allergies

## 2012-03-28 NOTE — Telephone Encounter (Signed)
Called spoke with patient, advised of RB's recs as documented below.  Pt to keep upcoming appt w/ RB on 04-06-12.  Pt to call if symptoms do not improve or worsen.  Rx sent to verified pharmacy.

## 2012-04-06 ENCOUNTER — Ambulatory Visit (INDEPENDENT_AMBULATORY_CARE_PROVIDER_SITE_OTHER): Payer: Medicare Other | Admitting: Emergency Medicine

## 2012-04-06 ENCOUNTER — Encounter: Payer: Self-pay | Admitting: Emergency Medicine

## 2012-04-06 VITALS — BP 120/70 | HR 69 | Temp 98.4°F | Ht 69.0 in | Wt 178.8 lb

## 2012-04-06 DIAGNOSIS — J309 Allergic rhinitis, unspecified: Secondary | ICD-10-CM

## 2012-04-06 DIAGNOSIS — J479 Bronchiectasis, uncomplicated: Secondary | ICD-10-CM

## 2012-04-06 NOTE — Progress Notes (Signed)
Subjective:    Patient ID: Eric Brock, male    DOB: 1941/05/25, 71 y.o.   MRN: [id number] HPI 71 yo man, former smoker 12 pk-yrs, hx reported bronchiectasis dx originally 1970's. Also with hx non-Hodgkins lymphoma, treated with . Referred for cough by Dr Lovell Sheehan.   Tells me that he began to have cough, prod of green sputum, beginning in Feb 2012. He may have had some nasal gtt, sinus disease in the Sping months. This has been a pattern for many yrs. He has documented bronchiectasis as above. His cough is much better, still happens some with clear mucous. Last abx finished 2 mo ago. He was started on SABA bid and prn - he thinks it has helped him.   Former Artist, worked administration.   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 He believes that he caught a URI since last time, probably 10 days ago - led to recurrence of cough, dark phlegm, greenish, some blood streaks.  Has clear nasal gtt, URI symptoms, getting better.   ROV 03/12/11 -- f/u for cough in setting mild bronchiectasis and UA irritation syndrome. Treated 1 mo ago for bronchititis w URI. Treated w/ doxycycline, sputum and cough better. He is currently experiencing nasal congestion, some drainage. He has tried some NSW's and his wife's zyrtec. Also using some decongestants.   ROV 06/07/11 -- cough, bronchiectasis. Last time we added NSW and loratadine. He has been on doxycycline for 2 months now for possible prostatitis, but which may have been helping chest secretions as well. Has had some nasal congestion that is almost completely occlusive since last visit - he felt that it cleared up some when he stopped [place]. He is taking the loratadine daily.  Nasal passages are clear now.  Taking nasonex x last 3 weeks. He has been treated x 2 with cortisone shots for nasal congestion.   ROV 12/07/11 -- cough, bronchiectasis. Has been having trouble lately during the allergy sx. Twice since last time has been  treated with pred once, abx twice in the last 3 months. He is taking loratadine and nasonex, uses mucinex. Now close to his baseline.   Acute OV 8/20 -- The patient comes in today for an acute sick visit related to nocturnal cough and worsening postnasal drip.  He has a history of allergic rhinitis as well as bronchiectasis, but has a chronic intermittent cough that is related primarily to his postnasal drip.  He has seen worsening of his allergy symptoms over the last 10 days, and is waking up at night with significant postnasal drip and coughing episodes.  He denies any sinus pressure or significant congestion.  He has had no purulence from his nose.  He will bring up discolored mucus on rare occasions from his chest, but does not feel congested in his chest, nor does he have shortness of breath.  He has not had any fever.  ROV 04/06/12 -- allergic rhinitis, cough, bronchiectasis. He had acute worsening of cough and nasal gtt, seen last month by [facility]. His anti-histamines were increased, restarted NSW's. Continued to have colored sputum so gave avelox >> better. Still has has nasal gtt, some throat irritation, dry cough. Some MSK CP/soreness.     Objective:   Physical Exam Filed Vitals:   04/06/12 0903  BP: 120/70  Pulse: 69  Temp: 98.4 F (36.9 C)   Gen: Pleasant, well-nourished, in no distress,  normal affect  ENT: No lesions,  mouth clear,  oropharynx clear,  no postnasal drip  Neck: No JVD, no TMG, no carotid bruits  Lungs: No use of accessory muscles, R clear, few L basilar insp squeeks  Cardiovascular: RRR, heart sounds normal, no murmur or gallops, no peripheral edema  Musculoskeletal: No deformities, no cyanosis or clubbing  Neuro: alert, non focal  Skin: Warm, no lesions or rash      CT CHEST WITH CONTRAST 12/07/10  Technique: Multidetector CT imaging of the chest was performed  following the standard protocol during bolus administration of  intravenous contrast.  Contrast:  80 ml Omnipaque-300.  Comparison: 10/15/2010. 10/05/2007.  Findings: There is no axillary adenopathy. Aorta and branch  vessels appear within normal limits aside from atherosclerosis.  Coronary artery atherosclerosis. No pericardial effusion. No  pleural effusion. Cholecystectomy. Pulmonary arteries appear  within normal limits. Scarring and / or atelectasis has increased  in both the lingula and right middle lobe, with associated  cylindrical bronchiectasis. The lower lobes are within normal  limits bilaterally in the upper lobes excepting the lingula appear  normal.  No mass lesion is identified. Mild volume loss in the areas of  atelectasis/scarring. There is no mediastinal or hilar adenopathy  in this patient with a history of lymphoma. Retrocrural nodes are  within normal limits.  Coronal and sagittal reconstructed images demonstrate a right  diaphragmatic hernia containing a portion of the liver. No  complicating features.  IMPRESSION:  1. Mild progression of lingular and right middle lobes scarring and  atelectasis. Cylindrical bronchiectasis remains present. This may  relate to remote insult such as infection. Active atypical  infection is considered unlikely.  2. Tiny scattered pulmonary nodules under 4 mm are stable,  consistent with a benign etiology.  3. No lymphadenopathy.  4. Lateral right diaphragmatic hernia containing liver.   Assessment & Plan:  BRONCHIECTASIS Recent flare due to worsening allergies, seems to have resolved after avelox.  - mucinex - will defer repeat CT scan at this time. Likely needs repeat in next year. The schedule can be tailored to his symptoms.   ALLERGIC RHINITIS Start doing [place] and nasal steroid on a schedule.  Continue loratadine and chlorpheniramine

## 2012-04-06 NOTE — Assessment & Plan Note (Signed)
Start doing [place] and nasal steroid on a schedule.  Continue loratadine and chlorpheniramine

## 2012-04-06 NOTE — Patient Instructions (Addendum)
Please continue your loratadine 10mg  daily Start taking your Nasonex nasal spray every day, at least through the Fall. Take 2 sprays each nostril once a day.  Restart nasal saline washes every day. You may want to avoid doing right before bedtime Use your mucinex twice a day Take chlorpheniramine as needed Follow with Dr Delton Coombes in 6 months or sooner if you have any problems

## 2012-04-06 NOTE — Assessment & Plan Note (Addendum)
Recent flare due to worsening allergies, seems to have resolved after avelox.  - mucinex - will defer repeat CT scan at this time. Likely needs repeat in next year. The schedule can be tailored to his symptoms.

## 2012-05-23 ENCOUNTER — Other Ambulatory Visit: Payer: Self-pay | Admitting: *Deleted

## 2012-05-23 MED ORDER — ATORVASTATIN CALCIUM 10 MG PO TABS: 10.0000 mg | ORAL_TABLET | Freq: Every day | ORAL | Status: DC

## 2012-06-28 ENCOUNTER — Encounter: Payer: Self-pay | Admitting: Gastroenterology

## 2012-07-06 ENCOUNTER — Ambulatory Visit (INDEPENDENT_AMBULATORY_CARE_PROVIDER_SITE_OTHER): Payer: Medicare Other | Admitting: Internal Medicine

## 2012-07-06 ENCOUNTER — Encounter: Payer: Self-pay | Admitting: Internal Medicine

## 2012-07-06 VITALS — BP 130/80 | HR 72 | Temp 97.9°F | Resp 16 | Ht 69.0 in | Wt 176.0 lb

## 2012-07-06 DIAGNOSIS — E785 Hyperlipidemia, unspecified: Secondary | ICD-10-CM

## 2012-07-06 DIAGNOSIS — N529 Male erectile dysfunction, unspecified: Secondary | ICD-10-CM

## 2012-07-06 DIAGNOSIS — R972 Elevated prostate specific antigen [PSA]: Secondary | ICD-10-CM

## 2012-07-06 DIAGNOSIS — J309 Allergic rhinitis, unspecified: Secondary | ICD-10-CM

## 2012-07-06 MED ORDER — SILDENAFIL CITRATE 100 MG PO TABS: 50.0000 mg | ORAL_TABLET | Freq: Every day | ORAL | Status: DC | PRN

## 2012-07-06 MED ORDER — FLUTICASONE PROPIONATE 50 MCG/ACT NA SUSP: 2.0000 | Freq: Every day | NASAL | Status: DC

## 2012-07-06 NOTE — Progress Notes (Signed)
Subjective:    Patient ID: Eric Brock, male    DOB: [birth year], 71 y.o.   MRN: [id number]  HPI Follow up for lipids, bronchiectasis and chronic cough Has hx of bronchitis and "plugs" that he coughs up with mucinex Hx of NHL and has been seeing Dr Truett Perna for surveillance     Review of Systems  Constitutional: Negative for fever and fatigue.  HENT: Negative for hearing loss, congestion, neck pain and postnasal drip.   Eyes: Negative for discharge, redness and visual disturbance.  Respiratory: Negative for cough, shortness of breath and wheezing.   Cardiovascular: Negative for leg swelling.  Gastrointestinal: Negative for abdominal pain, constipation and abdominal distention.  Genitourinary: Negative for urgency and frequency.  Musculoskeletal: Negative for joint swelling and arthralgias.  Skin: Negative for color change and rash.  Neurological: Negative for weakness and light-headedness.  Hematological: Negative for adenopathy.  Psychiatric/Behavioral: Negative for behavioral problems.   Past Medical History  Diagnosis Date  . Allergy   . Hyperlipidemia   . Lymphoma   . Bronchiectasis   . Arthritis     History   Social History  . Marital Status: Married    Spouse Name: N/A    Number of Children: N/A  . Years of Education: N/A   Occupational History  . retired    Social History Main Topics  . Smoking status: Former Smoker -- 1.0 packs/day for 12 years    Types: Cigarettes    Quit date: 08/03/1975  . Smokeless tobacco: Never Used  . Alcohol Use: Yes     Comment: 1 glass of wine daily  . Drug Use: No  . Sexually Active: Yes   Other Topics Concern  . Not on file   Social History Narrative  . No narrative on file    Past Surgical History  Procedure Date  . Cervical polypectomy   . Cholecystectomy   . Hernia repair     Family History  Problem Relation Age of Onset  . Brain cancer Mother   . Heart disease Father   . Cancer Brother     prostate     No Known Allergies  Current Outpatient Prescriptions on File Prior to Visit  Medication Sig Dispense Refill  . albuterol (PROVENTIL HFA;VENTOLIN HFA) 108 (90 BASE) MCG/ACT inhaler Inhale 2 puffs into the lungs 2 (two) times daily as needed.        Marland Kitchen atorvastatin (LIPITOR) 10 MG tablet Take 1 tablet (10 mg total) by mouth daily. 1/2 daily  90 tablet  3  . ibuprofen (ADVIL,MOTRIN) 200 MG tablet Take 200 mg by mouth every 6 (six) hours as needed.        . loratadine (CLARITIN) 10 MG tablet Take 10 mg by mouth daily.      . mometasone (NASONEX) 50 MCG/ACT nasal spray Place 2 sprays into the nose daily.  17 g  11  . saw palmetto 80 MG capsule Take 1 capsule (80 mg total) by mouth 2 (two) times daily.      . [DISCONTINUED] sildenafil (VIAGRA) 100 MG tablet Take 0.5-1 tablets (50-100 mg total) by mouth daily as needed for erectile dysfunction.  5 tablet  11    BP 130/80  Pulse 72  Temp 97.9 F (36.6 C)  Resp 16  Ht 5\' 9"  (1.753 m)  Wt 176 lb (79.833 kg)  BMI 25.99 kg/m2       Objective:   Physical Exam  Nursing note and vitals reviewed. Constitutional: He appears well-developed  and well-nourished.  HENT:  Head: Normocephalic and atraumatic.  Eyes: Conjunctivae normal are normal. Pupils are equal, round, and reactive to light.  Neck: Normal range of motion. Neck supple.  Cardiovascular: Normal rate and regular rhythm.   Pulmonary/Chest: Effort normal and breath sounds normal.  Abdominal: Soft. Bowel sounds are normal.          Assessment & Plan:  Lipid and psa monitoring reviewed bronchiectasis Needs formulary change to fluticasone   CPX in July

## 2012-07-06 NOTE — Patient Instructions (Signed)
The patient is instructed to continue all medications as prescribed. Schedule followup with check out clerk upon leaving the clinic  

## 2012-07-10 ENCOUNTER — Telehealth: Payer: Self-pay | Admitting: Oncology

## 2012-07-10 ENCOUNTER — Ambulatory Visit (HOSPITAL_BASED_OUTPATIENT_CLINIC_OR_DEPARTMENT_OTHER): Payer: Medicare Other | Admitting: Oncology

## 2012-07-10 VITALS — BP 136/78 | HR 69 | Temp 97.0°F | Resp 18 | Ht 69.0 in | Wt 180.0 lb

## 2012-07-10 DIAGNOSIS — C8589 Other specified types of non-Hodgkin lymphoma, extranodal and solid organ sites: Secondary | ICD-10-CM

## 2012-07-10 DIAGNOSIS — Z8572 Personal history of non-Hodgkin lymphomas: Secondary | ICD-10-CM

## 2012-07-10 NOTE — Progress Notes (Signed)
   [facility]    OFFICE PROGRESS NOTE   INTERVAL HISTORY:   He returns as scheduled. He continues to have intermittent sinus and lower respiratory infections. He is followed by Dr. Delton Coombes. No change over the scalp. No other complaint.  Objective:  Vital signs in last 24 hours:  Blood pressure 136/78, pulse 69, temperature 97 F (36.1 C), temperature source Oral, resp. rate 18, height 5\' 9"  (1.753 m), weight 180 lb (81.647 kg).    HEENT: Oropharynx without visible mass, prominent submandibular gland bilaterally, neck without mass Lymphatics: No cervical, supraclavicular, axillary, or inguinal nodes Resp: Scattered end inspiratory wheeze at the upper posterior chest that cleared after a few respirations, no respiratory distress Cardio: Regular rate and rhythm GI: No hepatosplenomegaly Vascular: No leg edema  Skin: Frontoparietal scalp without evidence of recurrent tumor    Medications: I have reviewed the patient's current medications.  Assessment/Plan: 1. Stage IA non-Hodgkin's lymphoma of the scalp diagnosed in March of 2000.  2. Bronchiectasis  3. history of a mildly elevated PSA-followed by Dr. Lovell Sheehan 4. Small erythematous lesion at the left side of the palate in December of 2012-not seen today  Disposition:  He remains in clinical remission from non-Hodgkin's lymphoma. Mr. Buskey would like to continue followup at the cancer Center. He will return for an office visit in one year.   Thornton Papas, MD  07/10/2012  3:13 PM

## 2012-07-10 NOTE — Telephone Encounter (Signed)
appts made and printed for pt aom °

## 2012-08-23 ENCOUNTER — Ambulatory Visit (INDEPENDENT_AMBULATORY_CARE_PROVIDER_SITE_OTHER): Payer: Medicare Other | Admitting: Emergency Medicine

## 2012-08-23 ENCOUNTER — Encounter: Payer: Self-pay | Admitting: Emergency Medicine

## 2012-08-23 VITALS — BP 122/74 | HR 65 | Temp 97.0°F | Ht 70.0 in | Wt 180.6 lb

## 2012-08-23 DIAGNOSIS — R05 Cough: Secondary | ICD-10-CM

## 2012-08-23 DIAGNOSIS — J479 Bronchiectasis, uncomplicated: Secondary | ICD-10-CM

## 2012-08-23 DIAGNOSIS — R059 Cough, unspecified: Secondary | ICD-10-CM

## 2012-08-23 DIAGNOSIS — R053 Chronic cough: Secondary | ICD-10-CM

## 2012-08-23 NOTE — Progress Notes (Signed)
Subjective:    Patient ID: Eric Brock, male    DOB: May 29, 1941, 72 y.o.   MRN: [id number] HPI 72 yo man, former smoker 12 pk-yrs, hx reported bronchiectasis dx originally 1970's. Also with hx non-Hodgkins lymphoma, treated with . Referred for cough by Dr Lovell Sheehan.   Tells me that he began to have cough, prod of green sputum, beginning in Feb 2012. He may have had some nasal gtt, sinus disease in the Sping months. This has been a pattern for many yrs. He has documented bronchiectasis as above. His cough is much better, still happens some with clear mucous. Last abx finished 2 mo ago. He was started on SABA bid and prn - he thinks it has helped him.   Former Artist, worked administration.   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 He believes that he caught a URI since last time, probably 10 days ago - led to recurrence of cough, dark phlegm, greenish, some blood streaks.  Has clear nasal gtt, URI symptoms, getting better.   ROV 03/12/11 -- f/u for cough in setting mild bronchiectasis and UA irritation syndrome. Treated 1 mo ago for bronchititis w URI. Treated w/ doxycycline, sputum and cough better. He is currently experiencing nasal congestion, some drainage. He has tried some NSW's and his wife's zyrtec. Also using some decongestants.   ROV 06/07/11 -- cough, bronchiectasis. Last time we added NSW and loratadine. He has been on doxycycline for 2 months now for possible prostatitis, but which may have been helping chest secretions as well. Has had some nasal congestion that is almost completely occlusive since last visit - he felt that it cleared up some when he stopped [place]. He is taking the loratadine daily.  Nasal passages are clear now.  Taking nasonex x last 3 weeks. He has been treated x 2 with cortisone shots for nasal congestion.   ROV 12/07/11 -- cough, bronchiectasis. Has been having trouble lately during the allergy sx. Twice since last time has been  treated with pred once, abx twice in the last 3 months. He is taking loratadine and nasonex, uses mucinex. Now close to his baseline.   Acute OV 8/20 -- The patient comes in today for an acute sick visit related to nocturnal cough and worsening postnasal drip.  He has a history of allergic rhinitis as well as bronchiectasis, but has a chronic intermittent cough that is related primarily to his postnasal drip.  He has seen worsening of his allergy symptoms over the last 10 days, and is waking up at night with significant postnasal drip and coughing episodes.  He denies any sinus pressure or significant congestion.  He has had no purulence from his nose.  He will bring up discolored mucus on rare occasions from his chest, but does not feel congested in his chest, nor does he have shortness of breath.  He has not had any fever.  ROV 04/06/12 -- allergic rhinitis, cough, bronchiectasis. He had acute worsening of cough and nasal gtt, seen last month by [facility]. His anti-histamines were increased, restarted NSW's. Continued to have colored sputum so gave avelox >> better. Still has has nasal gtt, some throat irritation, dry cough. Some MSK CP/soreness.   ROV 08/23/12 -- allergic rhinitis, cough, bronchiectasis. Has been doing fairly well - using mucinex, loratadine, fluticasone nasal spray. He is having nasal gtt, continued cough of clear mucous. Sounds like allergy, no known sick contacts. Has never needed SABA. Does cough some when he plays  racketball. Not currently doing saline washes. Denies fever or infectious sx.     Objective:   Physical Exam Filed Vitals:   08/23/12 1414  BP: 122/74  Pulse: 65  Temp: 97 F (36.1 C)   Gen: Pleasant, well-nourished, in no distress,  normal affect  ENT: No lesions,  mouth clear,  oropharynx clear, no postnasal drip  Neck: No JVD, no TMG, no carotid bruits  Lungs: No use of accessory muscles, R clear, few L basilar insp squeeks  Cardiovascular: RRR, heart sounds  normal, no murmur or gallops, no peripheral edema  Musculoskeletal: No deformities, no cyanosis or clubbing  Neuro: alert, non focal  Skin: Warm, no lesions or rash      CT CHEST WITH CONTRAST 12/07/10  Technique: Multidetector CT imaging of the chest was performed  following the standard protocol during bolus administration of  intravenous contrast.  Contrast: 80 ml Omnipaque-300.  Comparison: 10/15/2010. 10/05/2007.  Findings: There is no axillary adenopathy. Aorta and branch  vessels appear within normal limits aside from atherosclerosis.  Coronary artery atherosclerosis. No pericardial effusion. No  pleural effusion. Cholecystectomy. Pulmonary arteries appear  within normal limits. Scarring and / or atelectasis has increased  in both the lingula and right middle lobe, with associated  cylindrical bronchiectasis. The lower lobes are within normal  limits bilaterally in the upper lobes excepting the lingula appear  normal.  No mass lesion is identified. Mild volume loss in the areas of  atelectasis/scarring. There is no mediastinal or hilar adenopathy  in this patient with a history of lymphoma. Retrocrural nodes are  within normal limits.  Coronal and sagittal reconstructed images demonstrate a right  diaphragmatic hernia containing a portion of the liver. No  complicating features.  IMPRESSION:  1. Mild progression of lingular and right middle lobes scarring and  atelectasis. Cylindrical bronchiectasis remains present. This may  relate to remote insult such as infection. Active atypical  infection is considered unlikely.  2. Tiny scattered pulmonary nodules under 4 mm are stable,  consistent with a benign etiology.  3. No lymphadenopathy.  4. Lateral right diaphragmatic hernia containing liver.   Assessment & Plan:  BRONCHIECTASIS - will defer CT scan at this time, will repeat based on his sx. If still coughing at [facility] then will probably need to repeat  Chronic  cough - try to ramp up allergy rx - fluticasone, loratadine, chlorpheniramine or brompheniramine. He is hoping to avoid [place] because he once had a sinusitis after using - will refer for allergy eval if we can't treat w the above

## 2012-08-23 NOTE — Assessment & Plan Note (Signed)
-   will defer CT scan at this time, will repeat based on his sx. If still coughing at [facility] then will probably need to repeat

## 2012-08-23 NOTE — Assessment & Plan Note (Signed)
-   try to ramp up allergy rx - fluticasone, loratadine, chlorpheniramine or brompheniramine. He is hoping to avoid [place] because he once had a sinusitis after using - will refer for allergy eval if we can't treat w the above

## 2012-08-23 NOTE — Patient Instructions (Addendum)
Continue your loratadine and mucinex once a day Increase fluticasone to 2 sprays each side twice a day. Take at least an hour before bedtime.  Try using decongestants that contain either chlorpheniramine or brompheniramine Follow with Dr Eric Brock in 3 months or sooner if you have any problems.

## 2012-10-13 ENCOUNTER — Ambulatory Visit (INDEPENDENT_AMBULATORY_CARE_PROVIDER_SITE_OTHER): Payer: Medicare Other | Admitting: Pulmonary Disease

## 2012-10-13 ENCOUNTER — Encounter: Payer: Self-pay | Admitting: Pulmonary Disease

## 2012-10-13 ENCOUNTER — Other Ambulatory Visit: Payer: Medicare Other

## 2012-10-13 VITALS — BP 120/78 | HR 65 | Temp 98.5°F | Ht 70.5 in | Wt 182.2 lb

## 2012-10-13 DIAGNOSIS — J479 Bronchiectasis, uncomplicated: Secondary | ICD-10-CM

## 2012-10-13 MED ORDER — AMOXICILLIN-POT CLAVULANATE 875-125 MG PO TABS: 1.0000 | ORAL_TABLET | Freq: Two times a day (BID) | ORAL | Status: DC

## 2012-10-13 NOTE — Progress Notes (Signed)
  Subjective:    Patient ID: Eric Brock, male    DOB: 09/14/1940, 72 y.o.   MRN: [id number]  HPI  72 yo man, former smoker 12 pk-yrs, hx reported bronchiectasis dx originally 1970's. Also with hx non-Hodgkins lymphoma, treated with .  Tells me that he began to have cough, prod of green sputum, beginning in Feb 2012. He may have had some nasal gtt, sinus disease in the Sping months. This has been a pattern for many yrs.   Former Artist, worked administration.    ROV 08/23/12 -- allergic rhinitis, cough, bronchiectasis. Has been doing fairly well - using mucinex, loratadine, fluticasone nasal spray. He is having nasal gtt, continued cough of clear mucous. Sounds like allergy, no known sick contacts. Has never needed SABA. Does cough some when he plays racketball. Not currently doing saline washes. Denies fever or infectious sx.     10/13/2012 Acute Ov  RB pt. c/o chest congestion, cough w/ green phlem, wheezing, chest tx, nasla congestion, PND x 9 months now.  Nocturnal cough Reviewed Ct 5/12 lingular and right middle lobes scarring and atelectasis with Cylindrical bronchiectasis-Tiny scattered pulmonary nodules under 4 mm  Ct sinuses 2/11 neg    Review of Systems neg for any significant sore throat, dysphagia, itching, sneezing, nasal congestion or excess/ purulent secretions, fever, chills, sweats, unintended wt loss, pleuritic or exertional cp, hempoptysis, orthopnea pnd or change in chronic leg swelling. Also denies presyncope, palpitations, heartburn, abdominal pain, nausea, vomiting, diarrhea or change in bowel or urinary habits, dysuria,hematuria, rash, arthralgias, visual complaints, headache, numbness weakness or ataxia.     Objective:   Physical Exam  Gen. Pleasant, well-nourished, in no distress ENT - no lesions, no post nasal drip Neck: No JVD, no thyromegaly, no carotid bruits Lungs: no use of accessory muscles, no dullness to percussion, fine bibasal rales, no  rhonchi  Cardiovascular: Rhythm regular, heart sounds  normal, no murmurs or gallops, no peripheral edema Musculoskeletal: No deformities, no cyanosis or clubbing        Assessment & Plan:

## 2012-10-13 NOTE — Assessment & Plan Note (Signed)
Augmentin 875 twice daily x10 days Chest CT scan Blood test for allergies- RAST Can take mucinex daytime & DELSYM at night OK to use antihistaminic 

## 2012-10-13 NOTE — Patient Instructions (Addendum)
Augmentin 875 twice daily x10 days Chest CT scan Blood test for allergies- RAST Can take mucinex daytime & DELSYM at night OK to use antihistaminic

## 2012-10-16 LAB — ALLERGY FULL PROFILE
Allergen,Goose feathers, e70: <0.1 kU/L
Alternaria Alternata: <0.1 kU/L
Bermuda Grass: 3.09 kU/L — ABNORMAL HIGH
Candida Albicans: <0.1 kU/L
Cat Dander: 0.76 kU/L — ABNORMAL HIGH
Curvularia lunata: <0.1 kU/L
Dog Dander: 0.3 kU/L — ABNORMAL HIGH
Fescue: 2.99 kU/L — ABNORMAL HIGH
G009 Red Top: 3.44 kU/L — ABNORMAL HIGH
Goldenrod: 2.37 kU/L — ABNORMAL HIGH
Helminthosporium halodes: <0.1 kU/L
IgE (Immunoglobulin E), Serum: 263.2 IU/mL — ABNORMAL HIGH (ref 0.0–180.0)
Lamb's Quarters: 2.51 kU/L — ABNORMAL HIGH
Plantain: 2.12 kU/L — ABNORMAL HIGH
Stemphylium Botryosum: <0.1 kU/L

## 2012-10-17 ENCOUNTER — Other Ambulatory Visit: Payer: Medicare Other

## 2012-10-18 NOTE — Progress Notes (Signed)
Quick Note:  Pt aware of results. ______ 

## 2012-10-19 ENCOUNTER — Ambulatory Visit (INDEPENDENT_AMBULATORY_CARE_PROVIDER_SITE_OTHER)
Admission: RE | Admit: 2012-10-19 | Discharge: 2012-10-19 | Disposition: A | Discharge status: Home / Self Care | Payer: Medicare Other | Source: Ambulatory Visit | Attending: Pulmonary Disease | Admitting: Pulmonary Disease

## 2012-10-19 ENCOUNTER — Telehealth: Payer: Self-pay | Admitting: Emergency Medicine

## 2012-10-19 DIAGNOSIS — J479 Bronchiectasis, uncomplicated: Secondary | ICD-10-CM

## 2012-10-19 NOTE — Telephone Encounter (Signed)
Patient aware that there is turn around time on results and he may receive them tomorrow 3/21 or Monday of next week.  Pt leaving for out of town today. Please call #[phone] results.  Dr Vassie Loll please advise when results are returned. Thanks.

## 2012-10-19 NOTE — Telephone Encounter (Signed)
ATC patient, no answer [id number] 

## 2012-10-24 ENCOUNTER — Ambulatory Visit (INDEPENDENT_AMBULATORY_CARE_PROVIDER_SITE_OTHER): Payer: Medicare Other | Admitting: Emergency Medicine

## 2012-10-24 ENCOUNTER — Encounter: Payer: Self-pay | Admitting: Emergency Medicine

## 2012-10-24 VITALS — BP 130/78 | HR 71 | Temp 97.1°F | Ht 71.0 in | Wt 180.0 lb

## 2012-10-24 DIAGNOSIS — R053 Chronic cough: Secondary | ICD-10-CM

## 2012-10-24 DIAGNOSIS — R05 Cough: Secondary | ICD-10-CM

## 2012-10-24 DIAGNOSIS — R059 Cough, unspecified: Secondary | ICD-10-CM

## 2012-10-24 DIAGNOSIS — J479 Bronchiectasis, uncomplicated: Secondary | ICD-10-CM

## 2012-10-24 MED ORDER — LEVOFLOXACIN 500 MG PO TABS: 500.0000 mg | ORAL_TABLET | Freq: Every day | ORAL | Status: DC

## 2012-10-24 NOTE — Progress Notes (Signed)
Subjective:    Patient ID: Eric Brock, male    DOB: [birth year], 72 y.o.   MRN: [id number]  HPI 72 yo man, former smoker 12 pk-yrs, hx reported bronchiectasis dx originally 1970's. Also with hx non-Hodgkins lymphoma, treated with .  Tells me that he began to have cough, prod of green sputum, beginning in Feb 2012. He may have had some nasal gtt, sinus disease in the Sping months. This has been a pattern for many yrs.   Former Artist, worked administration.   ROV 08/23/12 -- allergic rhinitis, cough, bronchiectasis. Has been doing fairly well - using mucinex, loratadine, fluticasone nasal spray. He is having nasal gtt, continued cough of clear mucous. Sounds like allergy, no known sick contacts. Has never needed SABA. Does cough some when he plays racketball. Not currently doing saline washes. Denies fever or infectious sx.   Acute Ov 10/13/12 - RB pt. c/o chest congestion, cough w/ green phlem, wheezing, chest tx, nasla congestion, PND x 9 months now.  Nocturnal cough Reviewed Ct 5/12 lingular and right middle lobes scarring and atelectasis with Cylindrical bronchiectasis-Tiny scattered pulmonary nodules under 4 mm  Ct sinuses 2/11 neg  ROV 10/24/12 -- allergic rhinitis, cough, bronchiectasis. Seen by RA as above. CT scan chest done 3/21 >> stable bronchiectatic changes, micronodular disease. He was treated with augmentin, feels that the mucous may be a bit more clear. Still coughing clear stuff esp at night. He is on loratadine + fluticasone. Decongestant stopped - dried him out too much   Review of Systems neg for any significant sore throat, dysphagia, itching, sneezing, nasal congestion or excess/ purulent secretions, fever, chills, sweats, unintended wt loss, pleuritic or exertional cp, hempoptysis, orthopnea pnd or change in chronic leg swelling. Also denies presyncope, palpitations, heartburn, abdominal pain, nausea, vomiting, diarrhea or change in bowel or urinary habits,  dysuria,hematuria, rash, arthralgias, visual complaints, headache, numbness weakness or ataxia.     Objective:   Physical Exam Filed Vitals:   10/24/12 1015  BP: 130/78  Pulse: 71  Temp: 97.1 F (36.2 C)   Gen. Pleasant, well-nourished, in no distress ENT - no lesions, no post nasal drip Neck: No JVD, no thyromegaly, no carotid bruits Lungs: no use of accessory muscles, no dullness to percussion, fine bibasal rales, no rhonchi  Cardiovascular: Rhythm regular, heart sounds  normal, no murmurs or gallops, no peripheral edema Musculoskeletal: No deformities, no cyanosis or clubbing    CT chest 10/20/12 --  Mediastinum: Heart size is normal. There is no significant  pericardial fluid, thickening or pericardial calcification. There  is atherosclerosis of the thoracic aorta, the great vessels of the  mediastinum and the coronary arteries, including calcified  atherosclerotic plaque in the left anterior descending, left  circumflex and right coronary arteries. There is a large number of  prominent but nonenlarged mediastinal lymph nodes which are  conspicuous by their sheer number, rather than size (this is highly  nonspecific). No definite pathologic mediastinal lymphadenopathy  is appreciated. Please note that accurate exclusion of hilar  adenopathy is limited on noncontrast CT scans. Aneurysmal  dilatation of the proximal left subclavian artery measuring up to  2.1 cm in diameter is similar to the prior examination. Esophagus  is unremarkable in appearance.  Lungs/Pleura: There are patchy bilateral lower lung predominant  areas of mild cylindrical bronchiectasis. Associated with this  there is some chronic volume loss and architectural distortion in  the inferior segment of the lingula and the medial segment of the  right middle lobe, which are very similar to the prior examination.  There are no areas of peripheral subpleural reticulation,  parenchymal banding or honeycombing to  suggest the presence of an  interstitial lung disease at this time. There are a few scattered  small nodular opacities which are predominately peribronchovascular  distribution, which is in an area that previously had ground-glass  attenuation and some architectural distortion on the prior study,  suggesting that this nodule may be an area of post infectious or  inflammatory scarring. Other notable nodules are a new 4 mm nodule  in the medial aspect of the right upper lobe (image 17 of series  5), and a 4 mm nodule in the medial aspect of the right upper lobe  on image 12 of series 5). Calcified granuloma in the periphery of  the lower lingula. Inspiratory and expiratory images demonstrate  some mild trapping in the lung bases bilaterally, suggesting some  small airways disease. No pleural effusions.  Upper Abdomen: Unremarkable.  Musculoskeletal: There are no aggressive appearing lytic or blastic  lesions noted in the visualized portions of the skeleton.  IMPRESSION:  1. Scattered areas of lower lung predominant cylindrical  bronchiectasis appear very similar to the prior examination.  Associated with this there are patchy areas of micronodularity.  The overall appearance is nonspecific, and is favored to be related  to scarring from a prior infection, although a chronic indolent  atypical infectious process such as Mycobacterium avium-  intracellulare (MAI) is possible.  2. Multiple tiny pulmonary nodules, as above, largest of which  measure only 4 mm. These are likely part of the underlying process  discussed above, but attention on follow-up studies may be  warranted to ensure stability or resolution of the new nodules. If  the patient is at high risk for bronchogenic carcinoma, follow-up  chest CT at 1 year is recommended. If the patient is at low risk,  no follow-up is needed. This recommendation follows the consensus  statement: Guidelines for Management of Small Pulmonary  Nodules  Detected on CT Scans: A Statement from the Fleischner Society as  published in Radiology 2005; 237:395-400.  3. Atherosclerosis, including three-vessel coronary artery disease.  Additionally, there is aneurysmal dilatation of the proximal left  subclavian artery (2.1 cm in diameter), which is unchanged.  4. Mild air trapping in the lung bases bilaterally indicative of  small airway disease.     Assessment & Plan:  Chronic cough Difficult to determine whether cough is primarily due to clear drainage and UA effects vs contribution of his bronchiectasis. He was treated with augmentin, no pseudomonal or atypical coverage.  - levaquin x 7 days  - sample sputum for bacterial and AFL - continue loratadine + fluticasone - decrease chlorpheniramine to qhs  BRONCHIECTASIS - levaquin x 7 days - consider FOB to assess for AFB - albuterol prn

## 2012-10-24 NOTE — Patient Instructions (Addendum)
We will sample your sputum to send for cultures We will order levaquin. Take for 7 days Please continue your loratadine and fluticasone nasal spray Try using your decongestant only before bedtime Use ProAir 2 puffs if needed for shortness of breath Follow with Dr Delton Coombes in 2 months or sooner if you have any problems.

## 2012-10-25 NOTE — Telephone Encounter (Signed)
Pt was seen by RB on 10/24/12 ? Was results discussed during this visit [id number] for pt

## 2012-10-26 ENCOUNTER — Other Ambulatory Visit: Payer: Medicare Other

## 2012-10-26 DIAGNOSIS — R05 Cough: Secondary | ICD-10-CM

## 2012-10-26 DIAGNOSIS — R059 Cough, unspecified: Secondary | ICD-10-CM

## 2012-10-26 NOTE — Telephone Encounter (Signed)
Spoke with pt and he verified that Dr Delton Coombes had discussed ct results with him at [facility] on 10-24-12

## 2012-10-29 LAB — RESPIRATORY CULTURE OR RESPIRATORY AND SPUTUM CULTURE
Gram Stain: NONE SEEN
Organism ID, Bacteria: NORMAL

## 2012-10-31 NOTE — Assessment & Plan Note (Addendum)
Difficult to determine whether cough is primarily due to clear drainage and UA effects vs contribution of his bronchiectasis. He was treated with augmentin, no pseudomonal or atypical coverage.  - levaquin x 7 days  - sample sputum for bacterial and AFL - continue loratadine + fluticasone - decrease chlorpheniramine to qhs

## 2012-10-31 NOTE — Assessment & Plan Note (Signed)
-   levaquin x 7 days - consider FOB to assess for AFB - albuterol prn

## 2012-11-02 ENCOUNTER — Telehealth: Payer: Self-pay | Admitting: Emergency Medicine

## 2012-11-02 MED ORDER — ALBUTEROL SULFATE HFA 108 (90 BASE) MCG/ACT IN AERS: 2.0000 | INHALATION_SPRAY | Freq: Four times a day (QID) | RESPIRATORY_TRACT | Status: DC | PRN

## 2012-11-02 NOTE — Telephone Encounter (Signed)
I spoke with spouse. She stated pt needed a refill on rescue inhaler. i advised will send rx. Nothing further was needed

## 2012-11-14 ENCOUNTER — Ambulatory Visit: Payer: Medicare Other | Admitting: Emergency Medicine

## 2012-12-08 LAB — AFB CULTURE WITH SMEAR (NOT AT ARMC): Acid Fast Smear: NONE SEEN

## 2012-12-26 ENCOUNTER — Encounter: Payer: Self-pay | Admitting: Emergency Medicine

## 2012-12-26 ENCOUNTER — Ambulatory Visit (INDEPENDENT_AMBULATORY_CARE_PROVIDER_SITE_OTHER): Payer: Medicare Other | Admitting: Emergency Medicine

## 2012-12-26 VITALS — BP 118/68 | HR 61 | Temp 98.1°F | Ht 69.0 in | Wt 182.8 lb

## 2012-12-26 DIAGNOSIS — J309 Allergic rhinitis, unspecified: Secondary | ICD-10-CM

## 2012-12-26 DIAGNOSIS — J479 Bronchiectasis, uncomplicated: Secondary | ICD-10-CM

## 2012-12-26 MED ORDER — FLUTTER DEVI: 1.0000 | Freq: Every day | Status: DC

## 2012-12-26 NOTE — Patient Instructions (Addendum)
Please continue allegra and start using your fluticasone nasal spray 2 spray each side twice a day (at least through the allergy season) Try using a flutter valve daily to see if this helps with the timing of your mucous clearance Follow with Dr Delton Coombes in 6 months or sooner if you have any problems

## 2012-12-26 NOTE — Assessment & Plan Note (Signed)
-   continue the allergy regimen - try flutter qd - will reconsider FOB in the future if the cough continues or the sputum changes

## 2012-12-26 NOTE — Assessment & Plan Note (Signed)
Continue allegra, restart nasal steroid. Has not been able to tolerate NSW's.

## 2012-12-26 NOTE — Progress Notes (Signed)
Subjective:    Patient ID: Eric Brock, male    DOB: 07-17-1941, 72 y.o.   MRN: [id number]  HPI 72 yo man, former smoker 12 pk-yrs, hx reported bronchiectasis dx originally 1970's. Also with hx non-Hodgkins lymphoma, treated with .  Tells me that he began to have cough, prod of green sputum, beginning in Feb 2012. He may have had some nasal gtt, sinus disease in the Sping months. This has been a pattern for many yrs.   Former Artist, worked administration.   ROV 08/23/12 -- allergic rhinitis, cough, bronchiectasis. Has been doing fairly well - using mucinex, loratadine, fluticasone nasal spray. He is having nasal gtt, continued cough of clear mucous. Sounds like allergy, no known sick contacts. Has never needed SABA. Does cough some when he plays racketball. Not currently doing saline washes. Denies fever or infectious sx.   Acute Ov 10/13/12 - RB pt. c/o chest congestion, cough w/ green phlem, wheezing, chest tx, nasla congestion, PND x 9 months now.  Nocturnal cough Reviewed Ct 5/12 lingular and right middle lobes scarring and atelectasis with Cylindrical bronchiectasis-Tiny scattered pulmonary nodules under 4 mm  Ct sinuses 2/11 neg  ROV 10/24/12 -- allergic rhinitis, cough, bronchiectasis. Seen by RA as above. CT scan chest done 3/21 >> stable bronchiectatic changes, micronodular disease. He was treated with augmentin, feels that the mucous may be a bit more clear. Still coughing clear stuff esp at night. He is on loratadine + fluticasone. Decongestant stopped - dried him out too much  ROV 12/26/12 -- allergic rhinitis, cough, bronchiectasis, stable by Ct scan 10/20/12. Last time treated with levaquin, performed sputum cx >> AFB negative 3/27, but concern that may not have reflected lower resp sample. To my knowledge has never had BAL.  Remains on allegra, uses fluticasone but not reliably.   Review of Systems As per HPI     Objective:   Physical Exam Filed Vitals:   12/26/12 1332  BP: 118/68  Pulse: 61  Temp: 98.1 F (36.7 C)   Gen. Pleasant, well-nourished, in no distress ENT - no lesions, no post nasal drip Neck: No JVD, no thyromegaly, no carotid bruits Lungs: no use of accessory muscles, no dullness to percussion, fine bibasal rales, no rhonchi  Cardiovascular: Rhythm regular, heart sounds  normal, no murmurs or gallops, no peripheral edema Musculoskeletal: No deformities, no cyanosis or clubbing   CT chest 10/20/12 --  Mediastinum: Heart size is normal. There is no significant  pericardial fluid, thickening or pericardial calcification. There  is atherosclerosis of the thoracic aorta, the great vessels of the  mediastinum and the coronary arteries, including calcified  atherosclerotic plaque in the left anterior descending, left  circumflex and right coronary arteries. There is a large number of  prominent but nonenlarged mediastinal lymph nodes which are  conspicuous by their sheer number, rather than size (this is highly  nonspecific). No definite pathologic mediastinal lymphadenopathy  is appreciated. Please note that accurate exclusion of hilar  adenopathy is limited on noncontrast CT scans. Aneurysmal  dilatation of the proximal left subclavian artery measuring up to  2.1 cm in diameter is similar to the prior examination. Esophagus  is unremarkable in appearance.  Lungs/Pleura: There are patchy bilateral lower lung predominant  areas of mild cylindrical bronchiectasis. Associated with this  there is some chronic volume loss and architectural distortion in  the inferior segment of the lingula and the medial segment of the  right middle lobe, which are very similar to the  prior examination.  There are no areas of peripheral subpleural reticulation,  parenchymal banding or honeycombing to suggest the presence of an  interstitial lung disease at this time. There are a few scattered  small nodular opacities which are predominately  peribronchovascular  distribution, which is in an area that previously had ground-glass  attenuation and some architectural distortion on the prior study,  suggesting that this nodule may be an area of post infectious or  inflammatory scarring. Other notable nodules are a new 4 mm nodule  in the medial aspect of the right upper lobe (image 17 of series  5), and a 4 mm nodule in the medial aspect of the right upper lobe  on image 12 of series 5). Calcified granuloma in the periphery of  the lower lingula. Inspiratory and expiratory images demonstrate  some mild trapping in the lung bases bilaterally, suggesting some  small airways disease. No pleural effusions.  Upper Abdomen: Unremarkable.  Musculoskeletal: There are no aggressive appearing lytic or blastic  lesions noted in the visualized portions of the skeleton.  IMPRESSION:  1. Scattered areas of lower lung predominant cylindrical  bronchiectasis appear very similar to the prior examination.  Associated with this there are patchy areas of micronodularity.  The overall appearance is nonspecific, and is favored to be related  to scarring from a prior infection, although a chronic indolent  atypical infectious process such as Mycobacterium avium-  intracellulare (MAI) is possible.  2. Multiple tiny pulmonary nodules, as above, largest of which  measure only 4 mm. These are likely part of the underlying process  discussed above, but attention on follow-up studies may be  warranted to ensure stability or resolution of the new nodules. If  the patient is at high risk for bronchogenic carcinoma, follow-up  chest CT at 1 year is recommended. If the patient is at low risk,  no follow-up is needed. This recommendation follows the consensus  statement: Guidelines for Management of Small Pulmonary Nodules  Detected on CT Scans: A Statement from the Fleischner Society as  published in Radiology 2005; 237:395-400.  3. Atherosclerosis, including  three-vessel coronary artery disease.  Additionally, there is aneurysmal dilatation of the proximal left  subclavian artery (2.1 cm in diameter), which is unchanged.  4. Mild air trapping in the lung bases bilaterally indicative of  small airway disease.     Assessment & Plan:  ALLERGIC RHINITIS Continue allegra, restart nasal steroid. Has not been able to tolerate NSW's.   BRONCHIECTASIS - continue the allergy regimen - try flutter qd - will reconsider FOB in the future if the cough continues or the sputum changes

## 2013-02-05 ENCOUNTER — Other Ambulatory Visit (INDEPENDENT_AMBULATORY_CARE_PROVIDER_SITE_OTHER): Payer: Medicare Other

## 2013-02-05 DIAGNOSIS — E785 Hyperlipidemia, unspecified: Secondary | ICD-10-CM

## 2013-02-05 DIAGNOSIS — R972 Elevated prostate specific antigen [PSA]: Secondary | ICD-10-CM

## 2013-02-05 DIAGNOSIS — Z0000 Encounter for general adult medical examination without abnormal findings: Secondary | ICD-10-CM

## 2013-02-05 LAB — BASIC METABOLIC PANEL
CO2: 27 mEq/L (ref 19–32)
Chloride: 104 mEq/L (ref 96–112)
Creatinine, Ser: 0.9 mg/dL (ref 0.4–1.5)
Potassium: 4.4 mEq/L (ref 3.5–5.1)
Sodium: 141 mEq/L (ref 135–145)

## 2013-02-05 LAB — POCT URINALYSIS DIPSTICK
Bilirubin, UA: NEGATIVE
Blood, UA: NEGATIVE
Ketones, UA: NEGATIVE
Nitrite, UA: NEGATIVE
Protein, UA: NEGATIVE
pH, UA: 7

## 2013-02-05 LAB — PSA: PSA: 2.49 ng/mL (ref 0.10–4.00)

## 2013-02-05 LAB — CBC WITH DIFFERENTIAL/PLATELET
Basophils Relative: 0.7 % (ref 0.0–3.0)
Eosinophils Relative: 6.4 % — ABNORMAL HIGH (ref 0.0–5.0)
HCT: 44.2 % (ref 39.0–52.0)
Hemoglobin: 14.9 g/dL (ref 13.0–17.0)
MCV: 96.8 fl (ref 78.0–100.0)
Monocytes Absolute: 0.5 10*3/uL (ref 0.1–1.0)
Neutro Abs: 4.9 10*3/uL (ref 1.4–7.7)
Neutrophils Relative %: 59.7 % (ref 43.0–77.0)
RBC: 4.57 Mil/uL (ref 4.22–5.81)
WBC: 8.2 10*3/uL (ref 4.5–10.5)

## 2013-02-05 LAB — HEPATIC FUNCTION PANEL
ALT: 33 U/L (ref 0–53)
Albumin: 4.1 g/dL (ref 3.5–5.2)
Bilirubin, Direct: 0.1 mg/dL (ref 0.0–0.3)
Total Protein: 6.6 g/dL (ref 6.0–8.3)

## 2013-02-05 LAB — LIPID PANEL
HDL: 52.5 mg/dL (ref >39.00)
Total CHOL/HDL Ratio: 4
VLDL: 22.6 mg/dL (ref 0.0–40.0)

## 2013-02-12 ENCOUNTER — Ambulatory Visit (INDEPENDENT_AMBULATORY_CARE_PROVIDER_SITE_OTHER): Payer: Medicare Other | Admitting: Internal Medicine

## 2013-02-12 ENCOUNTER — Encounter: Payer: Self-pay | Admitting: Internal Medicine

## 2013-02-12 VITALS — BP 114/70 | HR 72 | Temp 98.6°F | Resp 16 | Ht 69.0 in | Wt 182.0 lb

## 2013-02-12 DIAGNOSIS — Z87898 Personal history of other specified conditions: Secondary | ICD-10-CM

## 2013-02-12 DIAGNOSIS — R972 Elevated prostate specific antigen [PSA]: Secondary | ICD-10-CM

## 2013-02-12 DIAGNOSIS — J320 Chronic maxillary sinusitis: Secondary | ICD-10-CM

## 2013-02-12 DIAGNOSIS — J479 Bronchiectasis, uncomplicated: Secondary | ICD-10-CM

## 2013-02-12 DIAGNOSIS — Z0000 Encounter for general adult medical examination without abnormal findings: Secondary | ICD-10-CM

## 2013-02-12 DIAGNOSIS — Z8572 Personal history of non-Hodgkin lymphomas: Secondary | ICD-10-CM

## 2013-02-12 DIAGNOSIS — E785 Hyperlipidemia, unspecified: Secondary | ICD-10-CM

## 2013-02-12 NOTE — Progress Notes (Signed)
Subjective:    Patient ID: Eric Brock, male    DOB: [birth year], 72 y.o.   MRN: [id number]  HPI CPX Yearly exam and follow up for  Cough and allergies improved with allegra Loss of smell and taste...    Review of Systems  Constitutional: Negative for fever and fatigue.  HENT: Negative for hearing loss, congestion, neck pain and postnasal drip.   Eyes: Negative for discharge, redness and visual disturbance.  Respiratory: Negative for cough, shortness of breath and wheezing.   Cardiovascular: Negative for leg swelling.  Gastrointestinal: Negative for abdominal pain, constipation and abdominal distention.  Genitourinary: Negative for urgency and frequency.  Musculoskeletal: Negative for joint swelling and arthralgias.  Skin: Negative for color change and rash.  Neurological: Negative for weakness and light-headedness.  Hematological: Negative for adenopathy.  Psychiatric/Behavioral: Negative for behavioral problems.   Past Medical History  Diagnosis Date  . Allergy   . Hyperlipidemia   . Lymphoma   . Bronchiectasis   . Arthritis     History   Social History  . Marital Status: Married    Spouse Name: N/A    Number of Children: N/A  . Years of Education: N/A   Occupational History  . retired    Social History Main Topics  . Smoking status: Former Smoker -- 1.00 packs/day for 12 years    Types: Cigarettes    Quit date: 08/03/1975  . Smokeless tobacco: Never Used  . Alcohol Use: Yes     Comment: 1 glass of wine daily  . Drug Use: No  . Sexually Active: Yes   Other Topics Concern  . Not on file   Social History Narrative  . No narrative on file    Past Surgical History  Procedure Laterality Date  . Cervical polypectomy    . Cholecystectomy    . Hernia repair      Family History  Problem Relation Age of Onset  . Brain cancer Mother   . Heart disease Father   . Cancer Brother     prostate    No Known Allergies  Current Outpatient  Prescriptions on File Prior to Visit  Medication Sig Dispense Refill  . albuterol (PROVENTIL HFA;VENTOLIN HFA) 108 (90 BASE) MCG/ACT inhaler Inhale 2 puffs into the lungs every 6 (six) hours as needed.  1 Inhaler  6  . atorvastatin (LIPITOR) 10 MG tablet Take 1 tablet (10 mg total) by mouth daily. 1/2 daily  90 tablet  3  . fexofenadine-pseudoephedrine (ALLEGRA-D 24) 180-240 MG per 24 hr tablet Take 1 tablet by mouth daily.      Marland Kitchen ibuprofen (ADVIL,MOTRIN) 200 MG tablet Take 200 mg by mouth every 6 (six) hours as needed.        Marland Kitchen Respiratory Therapy Supplies (FLUTTER) DEVI 1 Device by Does not apply route daily.  1 each  0  . saw palmetto 80 MG capsule Take 80 mg by mouth as needed.      . sildenafil (VIAGRA) 100 MG tablet Take 0.5-1 tablets (50-100 mg total) by mouth daily as needed.  10 tablet  3   No current facility-administered medications on file prior to visit.    BP 114/70  Pulse 72  Temp(Src) 98.6 F (37 C)  Resp 16  Ht 5\' 9"  (1.753 m)  Wt 182 lb (82.555 kg)  BMI 26.86 kg/m2       Objective:   Physical Exam  Nursing note and vitals reviewed. Constitutional: He is oriented to person, place, and  time. He appears well-developed and well-nourished.  HENT:  Head: Normocephalic and atraumatic.  Eyes: Conjunctivae are normal. Pupils are equal, round, and reactive to light.  Neck: Normal range of motion. Neck supple.  Cardiovascular: Normal rate and regular rhythm.   Pulmonary/Chest: Effort normal and breath sounds normal.  Abdominal: Soft. Bowel sounds are normal.  Musculoskeletal: Normal range of motion.  Neurological: He is alert and oriented to person, place, and time.  Skin: Skin is warm and dry.  Psychiatric: He has a normal mood and affect. His behavior is normal.          Assessment & Plan:   Patient presents for yearly preventative medicine examination.   all immunizations and health maintenance protocols were reviewed with the patient and they are up to date  with these protocols.   screening laboratory values were reviewed with the patient including screening of hyperlipidemia PSA renal function and hepatic function.   There medications past medical history social history problem list and allergies were reviewed in detail.   Goals were established with regard to weight loss exercise diet in compliance with medications   Informed consent was obtained in the lesion was treated for 60 seconds of liquid nitrogen application the patient tolerated the procedure well as procedural care was discussed with the patient and instructions should the lesion reappears contact our office immediately

## 2013-03-15 ENCOUNTER — Encounter: Payer: Self-pay | Admitting: Internal Medicine

## 2013-03-15 ENCOUNTER — Ambulatory Visit (INDEPENDENT_AMBULATORY_CARE_PROVIDER_SITE_OTHER): Payer: Medicare Other | Admitting: Internal Medicine

## 2013-03-15 VITALS — BP 120/66 | HR 60 | Temp 98.0°F | Ht 70.0 in | Wt 185.0 lb

## 2013-03-15 DIAGNOSIS — J479 Bronchiectasis, uncomplicated: Secondary | ICD-10-CM

## 2013-03-15 MED ORDER — AMOXICILLIN-POT CLAVULANATE 875-125 MG PO TABS: 1.0000 | ORAL_TABLET | Freq: Two times a day (BID) | ORAL | Status: DC

## 2013-03-15 NOTE — Progress Notes (Signed)
Subjective:    Patient ID: Eric Brock, male    DOB: 02-May-1941, 72 y.o.   MRN: [id number]  Brief patient profile:  33  yom  man, former smoker 12 pk-yrs, hx reported bronchiectasis dx originally 1970's. Also with hx non-Hodgkins lymphoma who began to have cough, prod of green sputum, beginning in Feb 2012. He may have had some nasal gtt, sinus disease in the Sping months. This has been a pattern for many yrs.   Former Artist, worked administration.     HPI Acute Ov 10/13/12 - RB pt. c/o chest congestion, cough w/ green phlem, wheezing, chest tx, nasla congestion, PND x 9 months now.  Nocturnal cough Reviewed Ct 5/12 lingular and right middle lobes scarring and atelectasis with Cylindrical bronchiectasis-Tiny scattered pulmonary nodules under 4 mm  Ct sinuses 2/11 neg   ROV 12/26/12 -- allergic rhinitis, cough, bronchiectasis, stable by Ct scan 10/20/12. Last time treated with levaquin, performed sputum cx >> AFB negative 3/27, but concern that may not have reflected lower resp sample. ? never had BAL.  Remains on allegra, uses fluticasone but not reliably.  rec Please continue allegra and start using your fluticasone nasal spray 2 spray each side twice a day (at least through the allergy season) Try using a flutter valve daily to see if this helps with the timing of your mucous clearance   03/15/2013 acute ov/ re bronchiectasis flare Chief Complaint  Patient presents with  . Acute Visit    Pt c/o increased cough x 3 days- prod with moderate green sputum. He reports had fever last night- resovled with advil.    increased nasal congestion also but nasal drainage is clear, did fine for months prior to present flare. No need for saba to date.   No obvious daytime variabilty or assoc sob or cp or chest tightness, subjective wheeze or overt hb symptoms. No unusual exp hx or h/o childhood pna/ asthma or knowledge of premature birth.  Sleeping ok without nocturnal  or early am  exacerbation  of respiratory  c/o's or need for noct saba. Also denies any obvious fluctuation of symptoms with weather or environmental changes or other aggravating or alleviating factors except as outlined above   Current Medications, Allergies, Past Medical History, Past Surgical History, Family History, and Social History were reviewed in [employer] record.  ROS  The following are not active complaints unless bolded sore throat, dysphagia, dental problems, itching, sneezing,  nasal congestion or excess/ purulent secretions, ear ache,   Fever s rigors, sweats, unintended wt loss, pleuritic or exertional cp, hemoptysis,  orthopnea pnd or leg swelling, presyncope, palpitations, heartburn, abdominal pain, anorexia, nausea, vomiting, diarrhea  or change in bowel or urinary habits, change in stools or urine, dysuria,hematuria,  rash, arthralgias, visual complaints, headache, numbness weakness or ataxia or problems with walking or coordination,  change in mood/affect or memory.             Objective:   Physical Exam  Wt Readings from Last 3 Encounters:  03/15/13 185 lb (83.915 kg)  02/12/13 182 lb (82.555 kg)  12/26/12 182 lb 12.8 oz (82.918 kg)     Gen. Pleasant, well-nourished, amb in no distress ENT - no lesions, no post nasal drip Neck: No JVD, no thyromegaly, no carotid bruits Lungs: no use of accessory muscles, no dullness to percussion min bilateral insp/ exp rhonchi  Cardiovascular: Rhythm regular, heart sounds  normal, no murmurs or gallops, no peripheral edema Musculoskeletal: No deformities,  no cyanosis or clubbing   CT chest 10/20/12 --  IMPRESSION:  1. Scattered areas of lower lung predominant cylindrical  bronchiectasis appear very similar to the prior examination.  Associated with this there are patchy areas of micronodularity.  The overall appearance is nonspecific, and is favored to be related  to scarring from a prior infection, although a  chronic indolent  atypical infectious process such as Mycobacterium avium-  intracellulare (MAI) is possible.  2. Multiple tiny pulmonary nodules, as above, largest of which  measure only 4 mm. These are likely part of the underlying process  discussed above, but attention on follow-up studies may be  warranted to ensure stability or resolution of the new nodules. If  the patient is at high risk for bronchogenic carcinoma, follow-up  chest CT at 1 year is recommended. If the patient is at low risk,  no follow-up is needed. This recommendation follows the consensus  statement: Guidelines for Management of Small Pulmonary Nodules  Detected on CT Scans: A Statement from the Fleischner Society as  published in Radiology 2005; 237:395-400.  3. Atherosclerosis, including three-vessel coronary artery disease.  Additionally, there is aneurysmal dilatation of the proximal left  subclavian artery (2.1 cm in diameter), which is unchanged.  4. Mild air trapping in the lung bases bilaterally indicative of  small airway disease.     Assessment & Plan:

## 2013-03-15 NOTE — Patient Instructions (Addendum)
augmentin 875 twice daily x 10 days with large glass of water  mucinex dose 1200 mg every 12 hours as needed for thick mucus and use the flutter valve as much as needed  Keep previous appointment with Dr Delton Coombes but see [place] back here sooner if needed

## 2013-03-17 NOTE — Assessment & Plan Note (Signed)
Acute flare with fever assoc with mild flare of sinus complaints c/w rhinosunisitis/ tracheobronhitis > bronchiectasis flare  rec course of augmentin, max use of mucolytics and flutter    Each maintenance medication was reviewed in detail including most importantly the difference between maintenance and as needed and under what circumstances the prns are to be used.  Please see instructions for details which were reviewed in writing and the patient given a copy.

## 2013-06-07 ENCOUNTER — Other Ambulatory Visit: Payer: Self-pay

## 2013-07-09 ENCOUNTER — Telehealth: Payer: Self-pay | Admitting: Oncology

## 2013-07-09 ENCOUNTER — Ambulatory Visit (HOSPITAL_BASED_OUTPATIENT_CLINIC_OR_DEPARTMENT_OTHER): Payer: Medicare Other | Admitting: Oncology

## 2013-07-09 VITALS — BP 141/73 | HR 68 | Temp 97.5°F | Resp 20 | Ht 70.0 in | Wt 180.8 lb

## 2013-07-09 DIAGNOSIS — J479 Bronchiectasis, uncomplicated: Secondary | ICD-10-CM

## 2013-07-09 DIAGNOSIS — Z8572 Personal history of non-Hodgkin lymphomas: Secondary | ICD-10-CM

## 2013-07-09 DIAGNOSIS — C8589 Other specified types of non-Hodgkin lymphoma, extranodal and solid organ sites: Secondary | ICD-10-CM

## 2013-07-09 NOTE — Progress Notes (Signed)
   [facility]    OFFICE PROGRESS NOTE   INTERVAL HISTORY:  Eric Brock returns for a scheduled visit. He reports a skin lesion was "burned" from the frontal scalp by Dr. Lovell Sheehan. This has not returned. No fever, night sweats, or anorexia. He is active and plays racquetball several days per week. He has noted soreness on the right anterior chest wall for the past month after having a cold. He wonders whether this could be related to the diaphragmatic hernia that was noted on a CT in March.  Objective:  Vital signs in last 24 hours:  Blood pressure 141/73, pulse 68, temperature 97.5 F (36.4 C), temperature source Oral, resp. rate 20, height 5\' 10"  (1.778 m), weight 180 lb 12.8 oz (82.01 kg).    HEENT: Oropharynx without visible mass, neck without mass Lymphatics: No cervical, supraclavicular, axillary, or inguinal nodes Resp: Lungs with end inspiratory wheeze bilaterally, no respiratory distress Cardio: Regular rate and rhythm GI: No hepatosplenomegaly, mild tenderness with deep palpation of the right costal margin, no mass Vascular: No leg edema  Skin: No evidence of recurrent tumor at the frontoparietal scalp.    Medications: I have reviewed the patient's current medications.  Assessment/Plan: 1. Stage IA non-Hodgkin's lymphoma of the scalp diagnosed in March of 2000.  2. Bronchiectasis  3. history of a mildly elevated PSA-followed by Dr. Lovell Sheehan  4. Small erythematous lesion at the left side of the palate in December of 2012-not seen today   Disposition:  Mr. Eric Brock remains in clinical remission from non-Hodgkin's lymphoma. He would like to continue followup at the [facility]. He will return for an office visit in one year.  I doubt the right chest wall soreness is related to the diaphragmatic hernia. He will discuss this with Dr. Delton Coombes when he is seen later this week. He will seek medical attention if the chest discomfort progresses.   Eric Papas, MD  07/09/2013  9:10 AM

## 2013-07-09 NOTE — Telephone Encounter (Signed)
gv and printed appt sched and avs for pt for DEC 2015.Marland KitchenMarland Kitchen

## 2013-07-13 ENCOUNTER — Ambulatory Visit (INDEPENDENT_AMBULATORY_CARE_PROVIDER_SITE_OTHER): Payer: Medicare Other | Admitting: Emergency Medicine

## 2013-07-13 ENCOUNTER — Encounter: Payer: Self-pay | Admitting: Emergency Medicine

## 2013-07-13 VITALS — BP 120/70 | HR 70 | Ht 71.0 in | Wt 179.9 lb

## 2013-07-13 DIAGNOSIS — J479 Bronchiectasis, uncomplicated: Secondary | ICD-10-CM

## 2013-07-13 NOTE — Progress Notes (Signed)
Subjective:    Patient ID: Eric Brock, male    DOB: 08-19-1940, 72 y.o.   MRN: [id number]  HPI 72 yo man, former smoker 12 pk-yrs, hx reported bronchiectasis dx originally 1970's. Also with hx non-Hodgkins lymphoma, treated with .  Tells me that he began to have cough, prod of green sputum, beginning in Feb 2012. He may have had some nasal gtt, sinus disease in the Sping months. This has been a pattern for many yrs.   Former Artist, worked administration.   ROV 08/23/12 -- allergic rhinitis, cough, bronchiectasis. Has been doing fairly well - using mucinex, loratadine, fluticasone nasal spray. He is having nasal gtt, continued cough of clear mucous. Sounds like allergy, no known sick contacts. Has never needed SABA. Does cough some when he plays racketball. Not currently doing saline washes. Denies fever or infectious sx.   Acute Ov 10/13/12 - RB pt. c/o chest congestion, cough w/ green phlem, wheezing, chest tx, nasla congestion, PND x 9 months now.  Nocturnal cough Reviewed Ct 5/12 lingular and right middle lobes scarring and atelectasis with Cylindrical bronchiectasis-Tiny scattered pulmonary nodules under 4 mm  Ct sinuses 2/11 neg  ROV 10/24/12 -- allergic rhinitis, cough, bronchiectasis. Seen by RA as above. CT scan chest done 3/21 >> stable bronchiectatic changes, micronodular disease. He was treated with augmentin, feels that the mucous may be a bit more clear. Still coughing clear stuff esp at night. He is on loratadine + fluticasone. Decongestant stopped - dried him out too much  ROV 12/26/12 -- allergic rhinitis, cough, bronchiectasis, stable by Ct scan 10/20/12. Last time treated with levaquin, performed sputum cx >> AFB negative 3/27, but concern that may not have reflected lower resp sample. To my knowledge has never had BAL.  Remains on allegra, uses fluticasone but not reliably.   ROV 07/13/13 -- allergic rhinitis, cough, bronchiectasis, stable by CT scan 10/20/12. He  has had one flare since last time in 8/'14. Started mucinex and is using a flutter prn. He is having some R sided CP but he is quite active and it may be MSK. No fluticasone nasal spray. No SABA use.     Review of Systems As per HPI     Objective:   Physical Exam Filed Vitals:   07/13/13 1007  BP: 120/70  Pulse: 70  Height: 5\' 11"  (1.803 m)  Weight: 179 lb 14.4 oz (81.602 kg)  SpO2: 95%   Gen. Pleasant, well-nourished, in no distress ENT - no lesions, no post nasal drip Neck: No JVD, no thyromegaly, no carotid bruits Lungs: no use of accessory muscles, no dullness to percussion, fine bibasal rales, no rhonchi  Cardiovascular: Rhythm regular, heart sounds  normal, no murmurs or gallops, no peripheral edema Musculoskeletal: No deformities, no cyanosis or clubbing   CT chest 10/20/12 --  Mediastinum: Heart size is normal. There is no significant  pericardial fluid, thickening or pericardial calcification. There  is atherosclerosis of the thoracic aorta, the great vessels of the  mediastinum and the coronary arteries, including calcified  atherosclerotic plaque in the left anterior descending, left  circumflex and right coronary arteries. There is a large number of  prominent but nonenlarged mediastinal lymph nodes which are  conspicuous by their sheer number, rather than size (this is highly  nonspecific). No definite pathologic mediastinal lymphadenopathy  is appreciated. Please note that accurate exclusion of hilar  adenopathy is limited on noncontrast CT scans. Aneurysmal  dilatation of the proximal left subclavian artery measuring up  to  2.1 cm in diameter is similar to the prior examination. Esophagus  is unremarkable in appearance.  Lungs/Pleura: There are patchy bilateral lower lung predominant  areas of mild cylindrical bronchiectasis. Associated with this  there is some chronic volume loss and architectural distortion in  the inferior segment of the lingula and the  medial segment of the  right middle lobe, which are very similar to the prior examination.  There are no areas of peripheral subpleural reticulation,  parenchymal banding or honeycombing to suggest the presence of an  interstitial lung disease at this time. There are a few scattered  small nodular opacities which are predominately peribronchovascular  distribution, which is in an area that previously had ground-glass  attenuation and some architectural distortion on the prior study,  suggesting that this nodule may be an area of post infectious or  inflammatory scarring. Other notable nodules are a new 4 mm nodule  in the medial aspect of the right upper lobe (image 17 of series  5), and a 4 mm nodule in the medial aspect of the right upper lobe  on image 12 of series 5). Calcified granuloma in the periphery of  the lower lingula. Inspiratory and expiratory images demonstrate  some mild trapping in the lung bases bilaterally, suggesting some  small airways disease. No pleural effusions.  Upper Abdomen: Unremarkable.  Musculoskeletal: There are no aggressive appearing lytic or blastic  lesions noted in the visualized portions of the skeleton.  IMPRESSION:  1. Scattered areas of lower lung predominant cylindrical  bronchiectasis appear very similar to the prior examination.  Associated with this there are patchy areas of micronodularity.  The overall appearance is nonspecific, and is favored to be related  to scarring from a prior infection, although a chronic indolent  atypical infectious process such as Mycobacterium avium-  intracellulare (MAI) is possible.  2. Multiple tiny pulmonary nodules, as above, largest of which  measure only 4 mm. These are likely part of the underlying process  discussed above, but attention on follow-up studies may be  warranted to ensure stability or resolution of the new nodules. If  the patient is at high risk for bronchogenic carcinoma, follow-up   chest CT at 1 year is recommended. If the patient is at low risk,  no follow-up is needed. This recommendation follows the consensus  statement: Guidelines for Management of Small Pulmonary Nodules  Detected on CT Scans: A Statement from the Fleischner Society as  published in Radiology 2005; 237:395-400.  3. Atherosclerosis, including three-vessel coronary artery disease.  Additionally, there is aneurysmal dilatation of the proximal left  subclavian artery (2.1 cm in diameter), which is unchanged.  4. Mild air trapping in the lung bases bilaterally indicative of  small airway disease.     Assessment & Plan:  BRONCHIECTASIS - will continue current routine > allegra, mucinex, flutter prn - repeat CT scan chest now to assess the bronchiectasis and R diaphragmatic hernia.  - rov 3

## 2013-07-13 NOTE — Patient Instructions (Signed)
Please continue your mucinex and allegra Use your flutter valve as needed We will repeat your CT scan of the chest and call you with the results Follow with Dr Delton Coombes in 3 months or sooner if you have any problems.

## 2013-07-13 NOTE — Assessment & Plan Note (Signed)
-   will continue current routine > allegra, mucinex, flutter prn - repeat CT scan chest now to assess the bronchiectasis and R diaphragmatic hernia.  - rov 3

## 2013-07-20 ENCOUNTER — Ambulatory Visit (INDEPENDENT_AMBULATORY_CARE_PROVIDER_SITE_OTHER)
Admission: RE | Admit: 2013-07-20 | Discharge: 2013-07-20 | Disposition: A | Discharge status: Home / Self Care | Payer: Medicare Other | Source: Ambulatory Visit | Attending: Emergency Medicine | Admitting: Emergency Medicine

## 2013-07-20 ENCOUNTER — Encounter: Payer: Self-pay | Admitting: Emergency Medicine

## 2013-07-20 DIAGNOSIS — J479 Bronchiectasis, uncomplicated: Secondary | ICD-10-CM

## 2013-07-20 NOTE — Telephone Encounter (Signed)
Please advise, Dr. Delton Coombes. Thanks!

## 2013-07-29 NOTE — Telephone Encounter (Signed)
Please let the patient know that there are SMALL areas of displacement of the diaphragm up into the chest (more on the R than on the L). These areas are what they referred to as hernias on his older scan. They look the same as before and likely are not affecting his breathing significantly. I can show him the films at his ROV if he would like to see.

## 2013-08-08 ENCOUNTER — Telehealth: Payer: Self-pay | Admitting: Oncology

## 2013-08-08 NOTE — Telephone Encounter (Signed)
Gave appt for MD for 2016 per pt rqst

## 2013-08-15 ENCOUNTER — Ambulatory Visit (INDEPENDENT_AMBULATORY_CARE_PROVIDER_SITE_OTHER): Payer: Medicare Other | Admitting: Internal Medicine

## 2013-08-15 ENCOUNTER — Encounter: Payer: Self-pay | Admitting: Internal Medicine

## 2013-08-15 VITALS — BP 130/74 | HR 72 | Temp 98.3°F | Resp 16 | Ht 71.0 in | Wt 178.0 lb

## 2013-08-15 DIAGNOSIS — R059 Cough, unspecified: Secondary | ICD-10-CM

## 2013-08-15 DIAGNOSIS — J320 Chronic maxillary sinusitis: Secondary | ICD-10-CM

## 2013-08-15 DIAGNOSIS — R053 Chronic cough: Secondary | ICD-10-CM

## 2013-08-15 DIAGNOSIS — E785 Hyperlipidemia, unspecified: Secondary | ICD-10-CM

## 2013-08-15 DIAGNOSIS — R05 Cough: Secondary | ICD-10-CM

## 2013-08-15 NOTE — Patient Instructions (Signed)
The patient is instructed to continue all medications as prescribed. Schedule followup with check out clerk upon leaving the clinic  

## 2013-08-15 NOTE — Progress Notes (Signed)
Subjective:    Patient ID: Eric Brock, male    DOB: March 28, 1941, 73 y.o.   MRN: [id number]  HPI Discussion of flu prevention and his other diagnoses Noted mucinex     Review of Systems  Constitutional: Negative for fever and fatigue.  HENT: Negative for congestion, hearing loss and postnasal drip.   Eyes: Negative for discharge, redness and visual disturbance.  Respiratory: Negative for cough, shortness of breath and wheezing.   Cardiovascular: Negative for leg swelling.  Gastrointestinal: Negative for abdominal pain, constipation and abdominal distention.  Genitourinary: Negative for urgency and frequency.  Musculoskeletal: Negative for arthralgias, joint swelling and neck pain.  Skin: Negative for color change and rash.       Dry skin  Neurological: Negative for weakness and light-headedness.  Hematological: Negative for adenopathy.  Psychiatric/Behavioral: Negative for behavioral problems.   Past Medical History  Diagnosis Date  . Allergy   . Hyperlipidemia   . Lymphoma   . Bronchiectasis   . Arthritis     History   Social History  . Marital Status: Married    Spouse Name: N/A    Number of Children: N/A  . Years of Education: N/A   Occupational History  . retired    Social History Main Topics  . Smoking status: Former Smoker -- 1.00 packs/day for 12 years    Types: Cigarettes    Quit date: 08/03/1975  . Smokeless tobacco: Never Used  . Alcohol Use: Yes     Comment: 1 glass of wine daily  . Drug Use: No  . Sexual Activity: Yes   Other Topics Concern  . Not on file   Social History Narrative  . No narrative on file    Past Surgical History  Procedure Laterality Date  . Cervical polypectomy    . Cholecystectomy    . Hernia repair      Family History  Problem Relation Age of Onset  . Brain cancer Mother   . Heart disease Father   . Cancer Brother     prostate    No Known Allergies  Current Outpatient Prescriptions on File Prior to  Visit  Medication Sig Dispense Refill  . albuterol (PROVENTIL HFA;VENTOLIN HFA) 108 (90 BASE) MCG/ACT inhaler Inhale 2 puffs into the lungs every 6 (six) hours as needed.  1 Inhaler  6  . atorvastatin (LIPITOR) 10 MG tablet Take 1 tablet (10 mg total) by mouth daily. 1/2 daily  90 tablet  3  . Dextromethorphan-Guaifenesin (MUCINEX DM MAXIMUM STRENGTH) 60-1200 MG TB12 Take 2 tablets by mouth daily.      . fexofenadine-pseudoephedrine (ALLEGRA-D 24) 180-240 MG per 24 hr tablet Take 1 tablet by mouth daily.      Marland Kitchen ibuprofen (ADVIL,MOTRIN) 200 MG tablet Take 200 mg by mouth every 6 (six) hours as needed.        Marland Kitchen Respiratory Therapy Supplies (FLUTTER) DEVI 1 Device by Does not apply route daily.  1 each  0  . sildenafil (VIAGRA) 100 MG tablet Take 0.5-1 tablets (50-100 mg total) by mouth daily as needed.  10 tablet  3   No current facility-administered medications on file prior to visit.    BP 130/74  Pulse 72  Temp(Src) 98.3 F (36.8 C)  Resp 16  Ht 5\' 11"  (1.803 m)  Wt 178 lb (80.74 kg)  BMI 24.84 kg/m2       Objective:   Physical Exam  Nursing note and vitals reviewed. Constitutional: He appears well-developed and  well-nourished.  HENT:  Head: Normocephalic and atraumatic.  Eyes: Conjunctivae are normal. Pupils are equal, round, and reactive to light.  Neck: Normal range of motion. Neck supple.  Cardiovascular: Normal rate and regular rhythm.   Pulmonary/Chest: Effort normal and breath sounds normal.  Abdominal: Soft. Bowel sounds are normal.  Skin: Skin is warm and dry.          Assessment & Plan:  Improved cough and congestion with duscussion of drug therapy Hernia on CT No this is eventration of the diaphragm right greater than left Discussion of the flu shot he   had it as a volunteer  Shingle shot  Given in 2011

## 2013-08-15 NOTE — Progress Notes (Signed)
Pre visit review using our clinic review tool, if applicable. No additional management support is needed unless otherwise documented below in the visit note. 

## 2013-09-25 ENCOUNTER — Ambulatory Visit: Payer: Medicare Other | Admitting: Emergency Medicine

## 2013-09-26 ENCOUNTER — Ambulatory Visit (INDEPENDENT_AMBULATORY_CARE_PROVIDER_SITE_OTHER): Payer: Medicare Other | Admitting: Emergency Medicine

## 2013-09-26 ENCOUNTER — Encounter: Payer: Self-pay | Admitting: Emergency Medicine

## 2013-09-26 VITALS — BP 124/86 | HR 66 | Ht 71.0 in | Wt 184.0 lb

## 2013-09-26 DIAGNOSIS — J479 Bronchiectasis, uncomplicated: Secondary | ICD-10-CM

## 2013-09-26 MED ORDER — LEVOFLOXACIN 750 MG PO TABS: 750.0000 mg | ORAL_TABLET | Freq: Every day | ORAL | Status: DC

## 2013-09-26 NOTE — Assessment & Plan Note (Signed)
With acute flare in setting URI.  - symptomatic relief - flutter valve - levaquin x 7 days - rov3

## 2013-09-26 NOTE — Patient Instructions (Signed)
Please continue your OTC symptomatic relief Use your flutter valve daily Take levaquin for the next 7 days Call our office if you aren't improved next week Follow with Dr Lamonte Sakai in 3 months or sooner if you have any problems.

## 2013-09-26 NOTE — Progress Notes (Signed)
Subjective:    Patient ID: Eric Brock, male    DOB: [birth year], 73 y.o.   MRN: [id number]  HPI 73 yo man, former smoker 12 pk-yrs, hx reported bronchiectasis dx originally 1970's. Also with hx non-Hodgkins lymphoma, treated with .  Tells me that he began to have cough, prod of green sputum, beginning in Feb 2012. He may have had some nasal gtt, sinus disease in the Sping months. This has been a pattern for many yrs.   Former Corporate investment banker, worked administration.   ROV 08/23/12 -- allergic rhinitis, cough, bronchiectasis. Has been doing fairly well - using mucinex, loratadine, fluticasone nasal spray. He is having nasal gtt, continued cough of clear mucous. Sounds like allergy, no known sick contacts. Has never needed SABA. Does cough some when he plays racketball. Not currently doing saline washes. Denies fever or infectious sx.   Acute Ov 10/13/12 - RB pt. c/o chest congestion, cough w/ green phlem, wheezing, chest tx, nasla congestion, PND x 9 months now.  Nocturnal cough Reviewed Ct 5/12 lingular and right middle lobes scarring and atelectasis with Cylindrical bronchiectasis-Tiny scattered pulmonary nodules under 4 mm  Ct sinuses 2/11 neg  ROV 10/24/12 -- allergic rhinitis, cough, bronchiectasis. Seen by RA as above. CT scan chest done 3/21 >> stable bronchiectatic changes, micronodular disease. He was treated with augmentin, feels that the mucous may be a bit more clear. Still coughing clear stuff esp at night. He is on loratadine + fluticasone. Decongestant stopped - dried him out too much  ROV 12/26/12 -- allergic rhinitis, cough, bronchiectasis, stable by Ct scan 10/20/12. Last time treated with levaquin, performed sputum cx >> AFB negative 3/27, but concern that may not have reflected lower resp sample. To my knowledge has never had BAL.  Remains on allegra, uses fluticasone but not reliably.   ROV 07/13/13 -- allergic rhinitis, cough, bronchiectasis, stable by CT scan 10/20/12. He  has had one flare since last time in 8/'14. Started mucinex and is using a flutter prn. He is having some R sided CP but he is quite active and it may be MSK. No fluticasone nasal spray. No SABA use.   ROV 09/26/13 -- allergic rhinitis, cough, bronchiectasis. He returns today with about 1 week of URI sx, has evolved now into purulent mucous. Green mucous. CT scan 12/'14 was stable. No fevers, no wheezing.    Review of Systems As per HPI     Objective:   Physical Exam Filed Vitals:   09/26/13 1158  BP: 124/86  Pulse: 66  Height: 5\' 11"  (1.803 m)  Weight: 184 lb (83.462 kg)  SpO2: 95%   Gen. Pleasant, well-nourished, in no distress ENT - no lesions, no post nasal drip Neck: No JVD, no thyromegaly, no carotid bruits Lungs: no use of accessory muscles, no dullness to percussion, fine bibasal rales, no rhonchi  Cardiovascular: Rhythm regular, heart sounds  normal, no murmurs or gallops, no peripheral edema Musculoskeletal: No deformities, no cyanosis or clubbing   CT scan 07/20/13 --  COMPARISON: 10/19/2012.  FINDINGS:  The chest wall is unremarkable and stable. No supraclavicular or  axillary mass or adenopathy. The thyroid gland is grossly normal and  stable. The bony thorax is intact. No destructive bone lesions or  spinal canal compromise. Minimal degenerative changes for age and  mild osteoporosis.  The heart is normal in size. No pericardial effusion. No mediastinal  or hilar mass or adenopathy. The aorta is normal in caliber. Mild  atherosclerotic calcifications and scattered  coronary artery  calcifications. The esophagus is grossly normal.  Examination of the lung parenchyma demonstrates no acute pulmonary  findings. No edema, infiltrates or effusions. Stable probable  postinflammatory/ postinfectious changes in the right middle lobe  and lingula with mild cylindrical bronchiectasis. Stable calcified  granuloma in the lingula and stable small scattered pulmonary  nodules.  No pleural effusion or worrisome pulmonary mass lesion.  Stable eventration of both hemidiaphragms, right greater than left.  IMPRESSION:  Stable CT appearance of the chest with chronic  postinflammatory/postinfectious changes in the right middle lobe and  lingula.  No new/acute pulmonary findings. Stable small scattered pulmonary  nodules.  Stable coronary artery calcifications.  Stable focal eventration of both hemidiaphragms, right greater than  left.      Assessment & Plan:  BRONCHIECTASIS With acute flare in setting URI.  - symptomatic relief - flutter valve - levaquin x 7 days - rov3

## 2013-10-02 ENCOUNTER — Telehealth: Payer: Self-pay | Admitting: Emergency Medicine

## 2013-10-02 MED ORDER — FLUTICASONE PROPIONATE 50 MCG/ACT NA SUSP: 2.0000 | Freq: Two times a day (BID) | NASAL | Status: DC

## 2013-10-02 NOTE — Telephone Encounter (Signed)
Pt is aware of recs and Rx sent to pharmacy.

## 2013-10-02 NOTE — Telephone Encounter (Signed)
Please ask him to start fluticasone nasal spray 2 sprays each nostril bid. Also he could consider starting nasal saline washes.

## 2013-10-02 NOTE — Telephone Encounter (Signed)
Pt was seen by RB on 2/25 with the following instructions:  Patient Instructions     Please continue your OTC symptomatic relief  Use your flutter valve daily  Take levaquin for the next 7 days  Call our office if you aren't improved next week  Follow with Dr Lamonte Sakai in 3 months or sooner if you have any problems.   -----  Called, spoke with pt.  He finished the last abx today.  Last night and this morning he felt like his nose, sinuses, and nostrils were "totally blocked."  He does get out a small amount of clear mucus from sinuses now and isn't coughing as much with clear mucus.  He feels his sinuses are also swollen.  He is requesting further recs.  RB, pls advise.  Thank you.

## 2013-10-25 ENCOUNTER — Other Ambulatory Visit: Payer: Self-pay | Admitting: Emergency Medicine

## 2013-11-26 ENCOUNTER — Telehealth: Payer: Self-pay | Admitting: Internal Medicine

## 2013-11-26 NOTE — Telephone Encounter (Signed)
Pt is a Eric Brock pt, he would like to know will you accept him as a new pt.  Please advise.

## 2013-11-27 NOTE — Telephone Encounter (Signed)
OK but we need to be offering some of these folks opportunity to see Dr Yong Channel who will start this summer.

## 2013-11-28 NOTE — Telephone Encounter (Signed)
lmom for patient to return call to office.  Dr. Elease Hashimoto will accept as a new patient.

## 2013-11-28 NOTE — Telephone Encounter (Signed)
Pt aware.

## 2014-01-17 ENCOUNTER — Encounter: Payer: Self-pay | Admitting: Pulmonary Disease

## 2014-01-17 ENCOUNTER — Telehealth: Payer: Self-pay | Admitting: Pulmonary Disease

## 2014-01-17 ENCOUNTER — Ambulatory Visit (INDEPENDENT_AMBULATORY_CARE_PROVIDER_SITE_OTHER): Payer: Medicare Other | Admitting: Pulmonary Disease

## 2014-01-17 ENCOUNTER — Other Ambulatory Visit: Payer: Medicare Other

## 2014-01-17 VITALS — BP 120/80 | HR 61 | Temp 98.1°F | Ht 70.0 in | Wt 179.4 lb

## 2014-01-17 DIAGNOSIS — J471 Bronchiectasis with (acute) exacerbation: Secondary | ICD-10-CM

## 2014-01-17 MED ORDER — LEVOFLOXACIN 750 MG PO TABS: 750.0000 mg | ORAL_TABLET | Freq: Every day | ORAL | Status: DC

## 2014-01-17 NOTE — Addendum Note (Signed)
Addended by: Lilli Few on: 01/17/2014 02:13 PM   Modules accepted: Orders

## 2014-01-17 NOTE — Assessment & Plan Note (Signed)
The patient has a long-standing history of bronchiectasis, and is describing a classic acute exacerbation. He has had cultures in the past that have not been successful at a dental and a colonizing flora. Will try and culture his mucus again, but will treat him with broad-spectrum antibiotic to see if he improves.

## 2014-01-17 NOTE — Patient Instructions (Signed)
Will treat with levaquin 750mg  one a day for 7 days. Continue with mucinex as needed Will check sputum culture today, and will call you with results Keep followup with Dr. Lamonte Sakai, but call if you are not improving.

## 2014-01-17 NOTE — Telephone Encounter (Signed)
I have sent in RX to [facility].  Confirmed by Kristopher Oppenheim this was received. I called pt and made aware. Nothing further needed

## 2014-01-17 NOTE — Progress Notes (Signed)
   Subjective:    Patient ID: Eric Brock, male    DOB: 02-16-1941, 73 y.o.   MRN: [id number]  HPI Patient comes in today for an acute sick visit. He has known bronchiectasis, and gives a 2 day history of increasing cough with green mucus, chest congestion, but no fever. He also feels that he has had some increased shortness of breath.   Review of Systems  Constitutional: Negative for fever and unexpected weight change.  HENT: Negative for congestion, dental problem, ear pain, nosebleeds, postnasal drip, rhinorrhea, sinus pressure, sneezing, sore throat and trouble swallowing.   Eyes: Negative for redness and itching.  Respiratory: Positive for cough. Negative for chest tightness, shortness of breath and wheezing.   Cardiovascular: Negative for palpitations and leg swelling.  Gastrointestinal: Negative for nausea and vomiting.  Genitourinary: Negative for dysuria.  Musculoskeletal: Negative for joint swelling.  Skin: Negative for rash.  Neurological: Negative for headaches.  Hematological: Does not bruise/bleed easily.  Psychiatric/Behavioral: Negative for dysphoric mood. The patient is not nervous/anxious.        Objective:   Physical Exam Thin male in no acute distress Nose without purulence or discharge noted Neck without lymphadenopathy or thyromegaly Chest with a few rhonchi and scattered crackles, no wheezing Cardiac exam with regular rate and rhythm Lower extremities without edema, varicosities present Alert and oriented, moves all 4 extremities.       Assessment & Plan:

## 2014-01-20 LAB — RESPIRATORY CULTURE OR RESPIRATORY AND SPUTUM CULTURE: Organism ID, Bacteria: NORMAL

## 2014-01-22 ENCOUNTER — Telehealth: Payer: Self-pay | Admitting: Emergency Medicine

## 2014-01-22 NOTE — Progress Notes (Signed)
Quick Note:  Spoke with pt and notified of results per Dr. Clance. Pt verbalized understanding and denied any questions.  ______ 

## 2014-01-22 NOTE — Telephone Encounter (Signed)
Notes Recorded by Kathee Delton, MD on 01/20/2014 at 1:39 PM Please let pt know that his sputum culture did not grow a predominant organism. Was worth a try. Finish abx and followup with Dr. Darnelle Maffucci with pt and notified of results per Dr. Gwenette Greet. Pt verbalized understanding and denied any questions.

## 2014-02-08 ENCOUNTER — Encounter: Payer: Self-pay | Admitting: Emergency Medicine

## 2014-02-08 ENCOUNTER — Ambulatory Visit (INDEPENDENT_AMBULATORY_CARE_PROVIDER_SITE_OTHER): Payer: Medicare Other | Admitting: Emergency Medicine

## 2014-02-08 VITALS — BP 114/60 | HR 58 | Ht 69.0 in | Wt 178.4 lb

## 2014-02-08 DIAGNOSIS — J479 Bronchiectasis, uncomplicated: Secondary | ICD-10-CM

## 2014-02-08 NOTE — Patient Instructions (Signed)
Please continue your current medications We will give you a sample container to use if we need you to give [place] a sputum sample  Follow with Dr Lamonte Sakai in 6 months or sooner if you have any problems

## 2014-02-08 NOTE — Progress Notes (Signed)
Subjective:    Patient ID: Eric Brock, male    DOB: [birth year], 73 y.o.   MRN: [id number]  HPI 73 yo man, former smoker 12 pk-yrs, hx reported bronchiectasis dx originally 1970's. Also with hx non-Hodgkins lymphoma, treated with .  Tells me that he began to have cough, prod of green sputum, beginning in Feb 2012. He may have had some nasal gtt, sinus disease in the Sping months. This has been a pattern for many yrs.   Former Corporate investment banker, worked administration.   ROV 08/23/12 -- allergic rhinitis, cough, bronchiectasis. Has been doing fairly well - using mucinex, loratadine, fluticasone nasal spray. He is having nasal gtt, continued cough of clear mucous. Sounds like allergy, no known sick contacts. Has never needed SABA. Does cough some when he plays racketball. Not currently doing saline washes. Denies fever or infectious sx.   Acute Ov 10/13/12 - RB pt. c/o chest congestion, cough w/ green phlem, wheezing, chest tx, nasla congestion, PND x 9 months now.  Nocturnal cough Reviewed Ct 5/12 lingular and right middle lobes scarring and atelectasis with Cylindrical bronchiectasis-Tiny scattered pulmonary nodules under 4 mm  Ct sinuses 2/11 neg  ROV 10/24/12 -- allergic rhinitis, cough, bronchiectasis. Seen by RA as above. CT scan chest done 3/21 >> stable bronchiectatic changes, micronodular disease. He was treated with augmentin, feels that the mucous may be a bit more clear. Still coughing clear stuff esp at night. He is on loratadine + fluticasone. Decongestant stopped - dried him out too much  ROV 12/26/12 -- allergic rhinitis, cough, bronchiectasis, stable by Ct scan 10/20/12. Last time treated with levaquin, performed sputum cx >> AFB negative 3/27, but concern that may not have reflected lower resp sample. To my knowledge has never had BAL.  Remains on allegra, uses fluticasone but not reliably.   ROV 07/13/13 -- allergic rhinitis, cough, bronchiectasis, stable by CT scan 10/20/12. He  has had one flare since last time in 8/'14. Started mucinex and is using a flutter prn. He is having some R sided CP but he is quite active and it may be MSK. No fluticasone nasal spray. No SABA use.   ROV 09/26/13 -- allergic rhinitis, cough, bronchiectasis. He returns today with about 1 week of URI sx, has evolved now into purulent mucous. Green mucous. CT scan 12/'14 was stable. No fevers, no wheezing.   ROV 02/08/14 -- follow up visit for bronchiectasis, rhinitis and cough. He was seen by Dr Gwenette Greet for an acute flare 01/17/14. He had a URI and developed green mucous. He improved on levaquin. He is back to baseline, now working outside and playing raquetball. Still coughs but much less plugging.    Review of Systems As per HPI     Objective:   Physical Exam Filed Vitals:   02/08/14 1205  BP: 114/60  Pulse: 58  Height: 5\' 9"  (1.753 m)  Weight: 178 lb 6.4 oz (80.922 kg)  SpO2: 98%   Gen. Pleasant, well-nourished, in no distress ENT - no lesions, no post nasal drip Neck: No JVD, no thyromegaly, no carotid bruits Lungs: no use of accessory muscles, no dullness to percussion, fine bibasal rales, no rhonchi  Cardiovascular: Rhythm regular, heart sounds  normal, no murmurs or gallops, no peripheral edema Musculoskeletal: No deformities, no cyanosis or clubbing   CT scan 07/20/13 --  COMPARISON: 10/19/2012.  FINDINGS:  The chest wall is unremarkable and stable. No supraclavicular or  axillary mass or adenopathy. The thyroid gland is grossly normal and  stable. The bony thorax is intact. No destructive bone lesions or  spinal canal compromise. Minimal degenerative changes for age and  mild osteoporosis.  The heart is normal in size. No pericardial effusion. No mediastinal  or hilar mass or adenopathy. The aorta is normal in caliber. Mild  atherosclerotic calcifications and scattered coronary artery  calcifications. The esophagus is grossly normal.  Examination of the lung parenchyma  demonstrates no acute pulmonary  findings. No edema, infiltrates or effusions. Stable probable  postinflammatory/ postinfectious changes in the right middle lobe  and lingula with mild cylindrical bronchiectasis. Stable calcified  granuloma in the lingula and stable small scattered pulmonary  nodules. No pleural effusion or worrisome pulmonary mass lesion.  Stable eventration of both hemidiaphragms, right greater than left.  IMPRESSION:  Stable CT appearance of the chest with chronic  postinflammatory/postinfectious changes in the right middle lobe and  lingula.  No new/acute pulmonary findings. Stable small scattered pulmonary  nodules.  Stable coronary artery calcifications.  Stable focal eventration of both hemidiaphragms, right greater than  left.      Assessment & Plan:  BRONCHIECTASIS Experienced a recent bronchiectasis flare after an apparent URI. He was treated with Levaquin and is now improved. He feels back to baseline. He continues to have some mild rhinitis on his allergy regimen but his sputum production is limited and will controlled.  - Continue same medications - Patient requested a sputum sample container in the event that he should need to give [place] a sample - If the patient is planning to leave town for an extended period of time I told him that we would give him a paper prescription for an about to take with him should he have signs of exacerbation - rov 6

## 2014-02-08 NOTE — Assessment & Plan Note (Signed)
Experienced a recent bronchiectasis flare after an apparent URI. He was treated with Levaquin and is now improved. He feels back to baseline. He continues to have some mild rhinitis on his allergy regimen but his sputum production is limited and will controlled.  - Continue same medications - Patient requested a sputum sample container in the event that he should need to give [place] a sample - If the patient is planning to leave town for an extended period of time I told him that we would give him a paper prescription for an about to take with him should he have signs of exacerbation - rov 6

## 2014-02-25 ENCOUNTER — Telehealth: Payer: Self-pay | Admitting: Emergency Medicine

## 2014-02-25 ENCOUNTER — Encounter: Payer: Self-pay | Admitting: Critical Care Medicine

## 2014-02-25 ENCOUNTER — Ambulatory Visit (INDEPENDENT_AMBULATORY_CARE_PROVIDER_SITE_OTHER): Payer: Medicare Other | Admitting: Critical Care Medicine

## 2014-02-25 VITALS — BP 122/84 | HR 70 | Temp 98.7°F | Ht 70.5 in | Wt 179.4 lb

## 2014-02-25 DIAGNOSIS — J471 Bronchiectasis with (acute) exacerbation: Secondary | ICD-10-CM

## 2014-02-25 MED ORDER — LEVOFLOXACIN 750 MG PO TABS: 750.0000 mg | ORAL_TABLET | Freq: Every day | ORAL | Status: DC

## 2014-02-25 NOTE — Assessment & Plan Note (Signed)
Acute bronchiectasis flare Plan Levaquin 750 mg daily for 7 days

## 2014-02-25 NOTE — Progress Notes (Signed)
Subjective:    Patient ID: Eric Brock, male    DOB: 09-13-1940, 73 y.o.   MRN: [id number]  HPI 02/25/2014 Chief Complaint  Patient presents with  . Acute Visit    chest congestion and prod coughwith green mucus x 2 days.  No increased SOB, wheezing, chest tightness/pain, or f/c/s.   Notes more cough x 2days.  Green mucus noted.  Not that short of breath Notes some pndrip.   Review of Systems Constitutional:   No  weight loss, night sweats,  Fevers, chills, fatigue, lassitude. HEENT:   No headaches,  Difficulty swallowing,  Tooth/dental problems,  Sore throat,                No sneezing, itching, ear ache, nasal congestion, post nasal drip,   CV:  No chest pain,  Orthopnea, PND, swelling in lower extremities, anasarca, dizziness, palpitations  GI  No heartburn, indigestion, abdominal pain, nausea, vomiting, diarrhea, change in bowel habits, loss of appetite  Resp: No shortness of breath with exertion or at rest.  Notes  excess mucus, nottes productive cough,  No non-productive cough,  No coughing up of blood.  Notes  change in color of mucus.  No wheezing.  No chest wall deformity  Skin: no rash or lesions.  GU: no dysuria, change in color of urine, no urgency or frequency.  No flank pain.  MS:  No joint pain or swelling.  No decreased range of motion.  No back pain.  Psych:  No change in mood or affect. No depression or anxiety.  No memory loss.     Objective:   Physical Exam Filed Vitals:   02/25/14 1418  BP: 122/84  Pulse: 70  Temp: 98.7 F (37.1 C)  TempSrc: Oral  Height: 5' 10.5" (1.791 m)  Weight: 179 lb 6.4 oz (81.375 kg)  SpO2: 95%    Gen: Pleasant, well-nourished, in no distress,  normal affect  ENT: No lesions,  mouth clear,  oropharynx clear, no postnasal drip  Neck: No JVD, no TMG, no carotid bruits  Lungs: No use of accessory muscles, no dullness to percussion, expired wheezes  Cardiovascular: RRR, heart sounds normal, no murmur or gallops,  no peripheral edema  Abdomen: soft and NT, no HSM,  BS normal  Musculoskeletal: No deformities, no cyanosis or clubbing  Neuro: alert, non focal  Skin: Warm, no lesions or rashes  No results found.       Assessment & Plan:   BRONCHIECTASIS Acute bronchiectasis flare Plan Levaquin 750 mg daily for 7 days   Updated Medication List Outpatient Encounter Prescriptions as of 02/25/2014  Medication Sig  . albuterol (PROVENTIL HFA;VENTOLIN HFA) 108 (90 BASE) MCG/ACT inhaler Inhale 2 puffs into the lungs every 6 (six) hours as needed.  Marland Kitchen atorvastatin (LIPITOR) 10 MG tablet Take 1 tablet (10 mg total) by mouth daily. 1/2 daily  . fexofenadine-pseudoephedrine (ALLEGRA-D 24) 180-240 MG per 24 hr tablet Take 1 tablet by mouth daily.  . fluticasone (FLONASE) 50 MCG/ACT nasal spray PLACE 2 SPRAYS INTO BOTH NOSTRILS 2 (TWO) TIMES DAILY.  Marland Kitchen guaiFENesin (MUCINEX) 600 MG 12 hr tablet Take 1,200 mg by mouth 2 (two) times daily.   Marland Kitchen ibuprofen (ADVIL,MOTRIN) 200 MG tablet Take 200 mg by mouth every 6 (six) hours as needed.   Marland Kitchen Respiratory Therapy Supplies (FLUTTER) DEVI 1 Device by Does not apply route daily.  . sildenafil (VIAGRA) 100 MG tablet Take 0.5-1 tablets (50-100 mg total) by mouth daily as needed.  Marland Kitchen  levofloxacin (LEVAQUIN) 750 MG tablet Take 1 tablet (750 mg total) by mouth daily.  . [DISCONTINUED] Dextromethorphan-Guaifenesin (ALKA-SELTZER PLUS MUCUS & CONG) 10-200 MG CAPS Take 1 tablet by mouth every 4 (four) hours.

## 2014-02-25 NOTE — Patient Instructions (Signed)
Levaquin 750mg  daily for 7days No other changes Use flutter valve 3-4 times daily

## 2014-02-25 NOTE — Telephone Encounter (Signed)
Called spoke with pt. appt scheduled to see PW this afternoon at 2:15. Nothing further needed

## 2014-03-01 LAB — AFB CULTURE WITH SMEAR (NOT AT ARMC): Acid Fast Smear: NONE SEEN

## 2014-03-20 ENCOUNTER — Ambulatory Visit (INDEPENDENT_AMBULATORY_CARE_PROVIDER_SITE_OTHER): Payer: Medicare Other | Admitting: Family Medicine

## 2014-03-20 ENCOUNTER — Encounter: Payer: Self-pay | Admitting: Family Medicine

## 2014-03-20 ENCOUNTER — Encounter: Payer: Medicare Other | Admitting: Internal Medicine

## 2014-03-20 VITALS — BP 132/72 | HR 60 | Temp 97.9°F | Wt 179.0 lb

## 2014-03-20 DIAGNOSIS — Z23 Encounter for immunization: Secondary | ICD-10-CM

## 2014-03-20 DIAGNOSIS — Z0000 Encounter for general adult medical examination without abnormal findings: Secondary | ICD-10-CM

## 2014-03-20 DIAGNOSIS — E785 Hyperlipidemia, unspecified: Secondary | ICD-10-CM | POA: Diagnosis not present

## 2014-03-20 DIAGNOSIS — Z125 Encounter for screening for malignant neoplasm of prostate: Secondary | ICD-10-CM | POA: Diagnosis not present

## 2014-03-20 LAB — CBC WITH DIFFERENTIAL/PLATELET
Basophils Absolute: 0 10*3/uL (ref 0.0–0.1)
Basophils Relative: 0.6 % (ref 0.0–3.0)
EOS PCT: 4.7 % (ref 0.0–5.0)
Eosinophils Absolute: 0.4 10*3/uL (ref 0.0–0.7)
HCT: 46.4 % (ref 39.0–52.0)
Hemoglobin: 15.4 g/dL (ref 13.0–17.0)
Lymphocytes Relative: 21.5 % (ref 12.0–46.0)
Lymphs Abs: 1.7 10*3/uL (ref 0.7–4.0)
MCHC: 33.2 g/dL (ref 30.0–36.0)
MCV: 95.6 fl (ref 78.0–100.0)
MONO ABS: 0.6 10*3/uL (ref 0.1–1.0)
Monocytes Relative: 7 % (ref 3.0–12.0)
NEUTROS PCT: 66.2 % (ref 43.0–77.0)
Neutro Abs: 5.3 10*3/uL (ref 1.4–7.7)
PLATELETS: 231 10*3/uL (ref 150.0–400.0)
RBC: 4.85 Mil/uL (ref 4.22–5.81)
RDW: 13.5 % (ref 11.5–15.5)
WBC: 7.9 10*3/uL (ref 4.0–10.5)

## 2014-03-20 LAB — TSH: TSH: 2.17 u[IU]/mL (ref 0.35–4.50)

## 2014-03-20 LAB — LIPID PANEL
CHOL/HDL RATIO: 4
Cholesterol: 204 mg/dL — ABNORMAL HIGH (ref 0–200)
HDL: 52.6 mg/dL (ref >39.00)
LDL CALC: 135 mg/dL — AB (ref 0–99)
NonHDL: 151.4
Triglycerides: 82 mg/dL (ref 0.0–149.0)
VLDL: 16.4 mg/dL (ref 0.0–40.0)

## 2014-03-20 LAB — HEPATIC FUNCTION PANEL
ALT: 24 U/L (ref 0–53)
AST: 20 U/L (ref 0–37)
Albumin: 4.1 g/dL (ref 3.5–5.2)
Alkaline Phosphatase: 60 U/L (ref 39–117)
Bilirubin, Direct: 0.1 mg/dL (ref 0.0–0.3)
Total Bilirubin: 1 mg/dL (ref 0.2–1.2)
Total Protein: 7 g/dL (ref 6.0–8.3)

## 2014-03-20 LAB — BASIC METABOLIC PANEL
BUN: 13 mg/dL (ref 6–23)
CHLORIDE: 104 meq/L (ref 96–112)
CO2: 30 meq/L (ref 19–32)
Calcium: 9.5 mg/dL (ref 8.4–10.5)
Creatinine, Ser: 1 mg/dL (ref 0.4–1.5)
GFR: 81.06 mL/min (ref >60.00)
GLUCOSE: 95 mg/dL (ref 70–99)
POTASSIUM: 4.8 meq/L (ref 3.5–5.1)
Sodium: 139 mEq/L (ref 135–145)

## 2014-03-20 LAB — PSA: PSA: 3.34 ng/mL (ref 0.10–4.00)

## 2014-03-20 NOTE — Progress Notes (Signed)
Subjective:    Patient ID: Eric Brock, male    DOB: 04/03/1941, 73 y.o.   MRN: [id number]  HPI Patient seen for Medicare wellness exam and for medical followup. He has history of hyperlipidemia but took himself off Lipitor in the past year.  Had some myalgias and they did improve after stopping this. He has not had history Prevenar 13. Also requesting flu shot.  He has bronchiectasis followed by pulmonary. Symptomatically stable. Still plays racquetball 3 days per week.  He has allergies which are treated with Flonase. He also takes antihistamines over-the-counter.  Past Medical History  Diagnosis Date  . Allergy   . Hyperlipidemia   . Lymphoma   . Bronchiectasis   . Arthritis    Past Surgical History  Procedure Laterality Date  . Cervical polypectomy    . Cholecystectomy    . Hernia repair      reports that he quit smoking about 38 years ago. His smoking use included Cigarettes. He has a 12 pack-year smoking history. He has never used smokeless tobacco. He reports that he drinks alcohol. He reports that he does not use illicit drugs. family history includes Brain cancer in his mother; Cancer in his brother; Heart disease in his father. No Known Allergies  1.  Risk factors based on Past Medical , Social, and Family history reviewed and as indicated above with no changes 2.  Limitations in physical activities None.  No recent falls. 3.  Depression/mood No active depression or anxiety issues 4.  Hearing No defiits 5.  ADLs independent in all. 6.  Cognitive function (orientation to time and place, language, writing, speech,memory) no short or long term memory issues.  Language and judgement intact. 7.  Home Safety no issues 8.  Height, weight, and visual acuity. all stable. 9.  Counseling discussed benefits of regular exercise. 10. Recommendation of preventive services. Influenza vaccine and Prevnar 13 11. Labs based on risk factors lipid,hepatic, PSA 12. Care Plan as  above. 13. Other Providers Dr Lamonte Sakai (pulmonary) 14. Written schedule of screening/prevention services given to patient.    Review of Systems  Constitutional: Negative for fever, activity change, appetite change and fatigue.  HENT: Negative for congestion, ear pain and trouble swallowing.   Eyes: Negative for pain and visual disturbance.  Respiratory: Negative for cough, shortness of breath and wheezing.   Cardiovascular: Negative for chest pain and palpitations.  Gastrointestinal: Negative for nausea, vomiting, abdominal pain, diarrhea, constipation, blood in stool, abdominal distention and rectal pain.  Genitourinary: Negative for dysuria, hematuria and testicular pain.  Musculoskeletal: Negative for arthralgias and joint swelling.  Skin: Negative for rash.  Neurological: Negative for dizziness, syncope and headaches.  Hematological: Negative for adenopathy.  Psychiatric/Behavioral: Negative for confusion and dysphoric mood.       Objective:   Physical Exam  Constitutional: He is oriented to person, place, and time. He appears well-developed and well-nourished. No distress.  HENT:  Head: Normocephalic and atraumatic.  Right Ear: External ear normal.  Left Ear: External ear normal.  Mouth/Throat: Oropharynx is clear and moist.  Eyes: Conjunctivae and EOM are normal. Pupils are equal, round, and reactive to light.  Neck: Normal range of motion. Neck supple. No thyromegaly present.  Cardiovascular: Normal rate, regular rhythm and normal heart sounds.   No murmur heard. Pulmonary/Chest: No respiratory distress. He has no wheezes. He has no rales.  Abdominal: Soft. Bowel sounds are normal. He exhibits no distension and no mass. There is no tenderness. There is  no rebound and no guarding.  Genitourinary: Rectum normal.  Prostate is slightly enlarged but nontender. No nodules  Musculoskeletal: He exhibits no edema.  Lymphadenopathy:    He has no cervical adenopathy.  Neurological:  He is alert and oriented to person, place, and time. He displays normal reflexes. No cranial nerve deficit.  Skin: No rash noted.  Psychiatric: He has a normal mood and affect.          Assessment & Plan:  #1 health maintenance. Prevnar 13 given. Flu vaccine given. Obtain screening labs. Patient requesting PSA as brother died of prostate cancer complications. #2 hyperlipidemia. Patient currently off statin medication. Recheck fasting lipid panel.

## 2014-03-20 NOTE — Patient Instructions (Signed)
Continue yearly flu vaccine. Repeat colonoscopy done next year. Tetanus booster in 2 years. Continue regular exercise habits.

## 2014-03-20 NOTE — Progress Notes (Signed)
Pre visit review using our clinic review tool, if applicable. No additional management support is needed unless otherwise documented below in the visit note. 

## 2014-03-22 ENCOUNTER — Other Ambulatory Visit: Payer: Self-pay | Admitting: Family Medicine

## 2014-03-22 ENCOUNTER — Encounter: Payer: Self-pay | Admitting: Family Medicine

## 2014-03-22 DIAGNOSIS — R972 Elevated prostate specific antigen [PSA]: Secondary | ICD-10-CM

## 2014-03-25 ENCOUNTER — Telehealth: Payer: Self-pay | Admitting: Family Medicine

## 2014-03-25 ENCOUNTER — Other Ambulatory Visit: Payer: Self-pay | Admitting: Family Medicine

## 2014-03-25 DIAGNOSIS — E785 Hyperlipidemia, unspecified: Secondary | ICD-10-CM

## 2014-03-25 MED ORDER — PRAVASTATIN SODIUM 20 MG PO TABS: 20.0000 mg | ORAL_TABLET | Freq: Every day | ORAL | Status: DC

## 2014-03-25 NOTE — Telephone Encounter (Signed)
Start Pravastatin 20 mg once daily and order future labs with lipid and hepatic in 8 weeks.

## 2014-03-25 NOTE — Telephone Encounter (Signed)
Rx sent to pharmacy and labs are ordered.

## 2014-04-09 ENCOUNTER — Ambulatory Visit (INDEPENDENT_AMBULATORY_CARE_PROVIDER_SITE_OTHER): Payer: Medicare Other | Admitting: Internal Medicine

## 2014-04-09 ENCOUNTER — Telehealth: Payer: Self-pay | Admitting: Internal Medicine

## 2014-04-09 ENCOUNTER — Ambulatory Visit (INDEPENDENT_AMBULATORY_CARE_PROVIDER_SITE_OTHER)
Admission: RE | Admit: 2014-04-09 | Discharge: 2014-04-09 | Disposition: A | Discharge status: Home / Self Care | Payer: Medicare Other | Source: Ambulatory Visit | Attending: Internal Medicine | Admitting: Internal Medicine

## 2014-04-09 ENCOUNTER — Encounter: Payer: Self-pay | Admitting: Internal Medicine

## 2014-04-09 VITALS — BP 144/80 | HR 67 | Ht 70.0 in | Wt 179.0 lb

## 2014-04-09 DIAGNOSIS — J471 Bronchiectasis with (acute) exacerbation: Secondary | ICD-10-CM

## 2014-04-09 MED ORDER — PREDNISONE 10 MG PO TABS: ORAL_TABLET | ORAL | Status: DC

## 2014-04-09 MED ORDER — LEVOFLOXACIN 500 MG PO TABS: 500.0000 mg | ORAL_TABLET | Freq: Every day | ORAL | Status: DC

## 2014-04-09 NOTE — Assessment & Plan Note (Signed)
Mild bronchiectasis flare up due to rhinitis Do CXR 2 view to ensure no pneumonia in light of recent fall START levaquin 500mg  once daily  X 5 days START  prednisone 40 mg x1 day, then 30 mg x1 day, then 20 mg x1 day, then 10 mg x1 day, and then 5 mg x1 day and stop   Followup  as before with Dr Angelica Chessman

## 2014-04-09 NOTE — Progress Notes (Signed)
Subjective:    Patient ID: Eric Brock, male    DOB: 18-Aug-1940, 73 y.o.   MRN: [id number]  HPI   OV 04/09/2014  Chief Complaint  Patient presents with  . Acute Visit    RB pt. Pt stated he fell on his right side and was questioning if he fractured ribs from fall. Pt c/o prod cough with white mucous and head congestion x 3 days. Pt denies SOB.    73 year old male patient of Dr. Baltazar Apo with bilateral middle lobe/lingula bronchiectasis and chronic rhinosinusitis. Comes in for an acute visit. Apparently 2 weeks ago was playing racquetball he tripped and fell on his right lower chest and he bruised his chest wall he did he fear that he might have sustained a right-sided rib fracture but he did have some pain at the local site that he recovered from and since then has continued his daily activities of living including playing racquetball and working in the garden. He is now scheduled to leave to [place] for a few days but earlier today started having a worsening of his chronic rhinosinusitis with clear sinus drainage and this is resulting in increased cough with also worsening of his right lower lobe chest pain. He is worried about rib fracture. There is no wheezing or change in color of sputum or fever or chills    has a past medical history of Allergy; Hyperlipidemia; Lymphoma; Bronchiectasis; and Arthritis.   has past surgical history that includes Cervical polypectomy; Cholecystectomy; and Hernia repair.  Current outpatient prescriptions:albuterol (PROVENTIL HFA;VENTOLIN HFA) 108 (90 BASE) MCG/ACT inhaler, Inhale 2 puffs into the lungs every 6 (six) hours as needed., Disp: 1 Inhaler, Rfl: 6;  fexofenadine-pseudoephedrine (ALLEGRA-D 24) 180-240 MG per 24 hr tablet, Take 1 tablet by mouth daily., Disp: , Rfl: ;  fluticasone (FLONASE) 50 MCG/ACT nasal spray, PLACE 2 SPRAYS INTO BOTH NOSTRILS 2 (TWO) TIMES DAILY., Disp: 16 g, Rfl: 6 guaiFENesin (MUCINEX) 600 MG 12 hr tablet, Take 1,200  mg by mouth 2 (two) times daily. , Disp: , Rfl: ;  ibuprofen (ADVIL,MOTRIN) 200 MG tablet, Take 200 mg by mouth every 6 (six) hours as needed. , Disp: , Rfl: ;  Ibuprofen-Diphenhydramine Cit (MOTRIN PM PO), Take by mouth as needed., Disp: , Rfl: ;  pravastatin (PRAVACHOL) 20 MG tablet, Take 1 tablet (20 mg total) by mouth daily., Disp: 30 tablet, Rfl: 5 Respiratory Therapy Supplies (FLUTTER) DEVI, 1 Device by Does not apply route daily., Disp: 1 each, Rfl: 0;  sildenafil (VIAGRA) 100 MG tablet, Take 0.5-1 tablets (50-100 mg total) by mouth daily as needed., Disp: 10 tablet, Rfl: 3   Review of Systems  Constitutional: Negative for fever and unexpected weight change.  HENT: Positive for congestion. Negative for dental problem, ear pain, nosebleeds, postnasal drip, rhinorrhea, sinus pressure, sneezing, sore throat and trouble swallowing.   Eyes: Negative for redness and itching.  Respiratory: Positive for cough. Negative for chest tightness, shortness of breath and wheezing.   Cardiovascular: Negative for palpitations and leg swelling.  Gastrointestinal: Negative for nausea and vomiting.  Genitourinary: Negative for dysuria.  Musculoskeletal: Negative for joint swelling.  Skin: Negative for rash.  Neurological: Negative for headaches.  Hematological: Does not bruise/bleed easily.  Psychiatric/Behavioral: Negative for dysphoric mood. The patient is not nervous/anxious.        Objective:   Physical Exam  Nursing note and vitals reviewed. Constitutional: He is oriented to person, place, and time. He appears well-developed and well-nourished. No distress.  HENT:  Head: Normocephalic and atraumatic.  Right Ear: External ear normal.  Left Ear: External ear normal.  Mouth/Throat: Oropharynx is clear and moist. No oropharyngeal exudate.  Thick uvula Post nasal drip +  Eyes: Conjunctivae and EOM are normal. Pupils are equal, round, and reactive to light. Right eye exhibits no discharge. Left eye  exhibits no discharge. No scleral icterus.  Neck: Normal range of motion. Neck supple. No JVD present. No tracheal deviation present. No thyromegaly present.  Cardiovascular: Normal rate, regular rhythm and intact distal pulses.  Exam reveals no gallop and no friction rub.   No murmur heard. Pulmonary/Chest: Effort normal. No respiratory distress. He has no wheezes. He has rales. He exhibits no tenderness.  Rales in middle lobe area  Abdominal: Soft. Bowel sounds are normal. He exhibits no distension and no mass. There is no tenderness. There is no rebound and no guarding.  Musculoskeletal: Normal range of motion. He exhibits no edema and no tenderness.  Lymphadenopathy:    He has no cervical adenopathy.  Neurological: He is alert and oriented to person, place, and time. He has normal reflexes. No cranial nerve deficit. Coordination normal.  Skin: Skin is warm and dry. No rash noted. He is not diaphoretic. No erythema. No pallor.  Psychiatric: He has a normal mood and affect. His behavior is normal. Judgment and thought content normal.    Filed Vitals:   04/09/14 1459  BP: 144/80  Pulse: 67  Height: 5\' 10"  (1.778 m)  Weight: 179 lb (81.194 kg)  SpO2: 97%   LABS Dg Chest 2 View  04/09/2014   CLINICAL DATA:  Suspect pneumonia  EXAM: CHEST  2 VIEW  COMPARISON:  numerous prior studies the most recent of which is chest radiograph 10/15/2010 and chest CT 07/1913  FINDINGS: Stable chronic uncoiling of the aorta. Heart size and vascular pattern stable and normal. Mild opacity right base stable from 2012. There is also mild opacity at the left base, which is also stable from 2012. There is day relatively lucent left lower lobe nodule measuring 6 mm. There is a sclerotic lesion in the right humerus in the proximal shaft measuring 2 cm. This area of bone is not imaged on prior studies. The uppermost edge of this process appears to have been present on a prior chest radiograph. It likely represents an  enchondroma or benign bone infarct.  IMPRESSION: Stable parenchymal scarring in both lung bases with stable 6 mm peripherally calcified granuloma left lower lobe. No evidence of acute abnormality to suggest pneumonia.   Electronically Signed   By: Skipper Cliche M.D.   On: 04/09/2014 16:37         Assessment & Plan:  Mild bronchiectasis flare up due to rhinitis Do CXR 2 view to ensure no pneumonia in light of recent fall START levaquin 500mg  once daily  X 5 days START  prednisone 40 mg x1 day, then 30 mg x1 day, then 20 mg x1 day, then 10 mg x1 day, and then 5 mg x1 day and stop   Followup  as before with Dr Angelica Chessman

## 2014-04-09 NOTE — Patient Instructions (Signed)
Mild bronchiectasis flare up due to rhinitis Do CXR 2 view to ensure no pneumonia in light of recent fall START levaquin 500mg  once daily  X 5 days START  prednisone 40 mg x1 day, then 30 mg x1 day, then 20 mg x1 day, then 10 mg x1 day, and then 5 mg x1 day and stop   Followup  as before with Dr Angelica Chessman

## 2014-04-09 NOTE — Telephone Encounter (Signed)
LEt Eric Brock  Know that cxr without rib fracture or pneumonia  Dg Chest 2 View  04/09/2014   CLINICAL DATA:  Suspect pneumonia  EXAM: CHEST  2 VIEW  COMPARISON:  numerous prior studies the most recent of which is chest radiograph 10/15/2010 and chest CT 07/1913  FINDINGS: Stable chronic uncoiling of the aorta. Heart size and vascular pattern stable and normal. Mild opacity right base stable from 2012. There is also mild opacity at the left base, which is also stable from 2012. There is day relatively lucent left lower lobe nodule measuring 6 mm. There is a sclerotic lesion in the right humerus in the proximal shaft measuring 2 cm. This area of bone is not imaged on prior studies. The uppermost edge of this process appears to have been present on a prior chest radiograph. It likely represents an enchondroma or benign bone infarct.  IMPRESSION: Stable parenchymal scarring in both lung bases with stable 6 mm peripherally calcified granuloma left lower lobe. No evidence of acute abnormality to suggest pneumonia.   Electronically Signed   By: Skipper Cliche M.D.   On: 04/09/2014 16:37    Dr. Brand Males, M.D., F.C.C.P Pulmonary and Critical Care Medicine Staff Physician [facility] Pager: [phone], If no answer or between  15:00h - 7:00h: call [phone]  04/09/2014 11:47 PM

## 2014-04-11 NOTE — Telephone Encounter (Signed)
lmtcb on both numbers.

## 2014-04-11 NOTE — Telephone Encounter (Signed)
Pt returning call can be reached @ [phone].[name]

## 2014-04-12 NOTE — Telephone Encounter (Signed)
Per pt request - LM with cxr results.

## 2014-04-12 NOTE — Telephone Encounter (Signed)
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

## 2014-05-17 ENCOUNTER — Other Ambulatory Visit: Payer: Self-pay | Admitting: Family Medicine

## 2014-05-17 ENCOUNTER — Encounter: Payer: Self-pay | Admitting: Family Medicine

## 2014-05-17 DIAGNOSIS — R300 Dysuria: Secondary | ICD-10-CM

## 2014-05-20 ENCOUNTER — Other Ambulatory Visit (INDEPENDENT_AMBULATORY_CARE_PROVIDER_SITE_OTHER): Payer: Medicare Other

## 2014-05-20 DIAGNOSIS — R972 Elevated prostate specific antigen [PSA]: Secondary | ICD-10-CM

## 2014-05-20 DIAGNOSIS — R300 Dysuria: Secondary | ICD-10-CM

## 2014-05-20 DIAGNOSIS — E785 Hyperlipidemia, unspecified: Secondary | ICD-10-CM

## 2014-05-20 LAB — POCT URINALYSIS DIPSTICK
Bilirubin, UA: NEGATIVE
GLUCOSE UA: NEGATIVE
Ketones, UA: NEGATIVE
LEUKOCYTES UA: NEGATIVE
Nitrite, UA: NEGATIVE
Protein, UA: NEGATIVE
RBC UA: NEGATIVE
Spec Grav, UA: 1.015
UROBILINOGEN UA: 0.2
pH, UA: 6.5

## 2014-05-20 LAB — LIPID PANEL
CHOL/HDL RATIO: 4
Cholesterol: 195 mg/dL (ref 0–200)
HDL: 43.8 mg/dL (ref >39.00)
LDL Cholesterol: 135 mg/dL — ABNORMAL HIGH (ref 0–99)
NONHDL: 151.2
Triglycerides: 79 mg/dL (ref 0.0–149.0)
VLDL: 15.8 mg/dL (ref 0.0–40.0)

## 2014-05-20 LAB — HEPATIC FUNCTION PANEL
ALT: 22 U/L (ref 0–53)
AST: 21 U/L (ref 0–37)
Albumin: 3.8 g/dL (ref 3.5–5.2)
Alkaline Phosphatase: 75 U/L (ref 39–117)
BILIRUBIN DIRECT: 0.1 mg/dL (ref 0.0–0.3)
Total Bilirubin: 1 mg/dL (ref 0.2–1.2)
Total Protein: 7.1 g/dL (ref 6.0–8.3)

## 2014-05-20 LAB — PSA: PSA: 2.94 ng/mL (ref 0.10–4.00)

## 2014-05-26 ENCOUNTER — Other Ambulatory Visit: Payer: Self-pay | Admitting: Emergency Medicine

## 2014-06-18 ENCOUNTER — Other Ambulatory Visit: Payer: Self-pay | Admitting: Emergency Medicine

## 2014-06-21 ENCOUNTER — Other Ambulatory Visit: Payer: Self-pay | Admitting: *Deleted

## 2014-06-21 MED ORDER — FLUTICASONE PROPIONATE 50 MCG/ACT NA SUSP: NASAL | Status: DC

## 2014-07-02 ENCOUNTER — Ambulatory Visit (INDEPENDENT_AMBULATORY_CARE_PROVIDER_SITE_OTHER): Payer: Medicare Other | Admitting: Adult Health

## 2014-07-02 ENCOUNTER — Encounter: Payer: Self-pay | Admitting: Adult Health

## 2014-07-02 VITALS — BP 110/82 | HR 63 | Ht 70.0 in | Wt 182.0 lb

## 2014-07-02 DIAGNOSIS — J471 Bronchiectasis with (acute) exacerbation: Secondary | ICD-10-CM

## 2014-07-02 MED ORDER — LEVOFLOXACIN 500 MG PO TABS: 500.0000 mg | ORAL_TABLET | Freq: Every day | ORAL | Status: DC

## 2014-07-02 MED ORDER — HYDROCODONE-HOMATROPINE 5-1.5 MG/5ML PO SYRP: 5.0000 mL | ORAL_SOLUTION | Freq: Every evening | ORAL | Status: DC | PRN

## 2014-07-02 NOTE — Progress Notes (Signed)
   Subjective:    Patient ID: Eric Brock, male    DOB: [birth year], 73 y.o.   MRN: [id number]  HPI 73 year old male patient of Dr. Baltazar Apo with bilateral middle lobe/lingula bronchiectasis and chronic rhinosinusitis.  07/02/2014 Acute OV  Complains of increased cough, congestion for the last 3 days . Mainly coughing up clear to white mucus.  Leaving to go out of town, afraid symptoms will worsen.  He denies any chest pain, orthopnea, PND, leg swelling, fever. No recent travel or antibiotic use. Using mucinex with some help Cough is keeping him up at night.    Review of Systems Constitutional:   No  weight loss, night sweats,  Fevers, chills,  +fatigue, or  lassitude.  HEENT:   No headaches,  Difficulty swallowing,  Tooth/dental problems, or  Sore throat,                No sneezing, itching, ear ache,  +nasal congestion, post nasal drip,   CV:  No chest pain,  Orthopnea, PND, swelling in lower extremities, anasarca, dizziness, palpitations, syncope.   GI  No heartburn, indigestion, abdominal pain, nausea, vomiting, diarrhea, change in bowel habits, loss of appetite, bloody stools.   Resp:   No chest wall deformity  Skin: no rash or lesions.  GU: no dysuria, change in color of urine, no urgency or frequency.  No flank pain, no hematuria   MS:  No joint pain or swelling.  No decreased range of motion.  No back pain.  Psych:  No change in mood or affect. No depression or anxiety.  No memory loss.         Objective:   Physical Exam GEN: A/Ox3; pleasant , NAD, elderly   HEENT:  [place]/AT,  EACs-clear, TMs-wnl, NOSE-clear drainage.  THROAT-clear, no lesions, no postnasal drip or exudate noted.   NECK:  Supple w/ fair ROM; no JVD; normal carotid impulses w/o bruits; no thyromegaly or nodules palpated; no lymphadenopathy.  RESP  Few scant rhonchi no accessory muscle use, no dullness to percussion  CARD:  RRR, no m/r/g  , no peripheral edema, pulses intact, no cyanosis  or clubbing.  GI:   Soft & nt; nml bowel sounds; no organomegaly or masses detected.  Musco: Warm bil, no deformities or joint swelling noted.   Neuro: alert, no focal deficits noted.    Skin: Warm, no lesions or rashes        Assessment & Plan:

## 2014-07-02 NOTE — Assessment & Plan Note (Signed)
Mild flare  Suggested Augmentin however pt declined stating that Levaquin is the only thing that works.   Plan  Levaquin 500mg  daily for 7 days to have on hold if symptoms worsen with discolored mucus  Mucinex DM Twice daily  As needed  Cough/congestion .  Hydromet 1 tsp At bedtime  As needed  Cough , may make you sleepy .  Follow up Dr. Lamonte Sakai  In 3 months and As needed   Please contact office for sooner follow up if symptoms do not improve or worsen or seek emergency care

## 2014-07-02 NOTE — Patient Instructions (Signed)
Levaquin 500mg  daily for 7 days to have on hold if symptoms worsen with discolored mucus  Mucinex DM Twice daily  As needed  Cough/congestion .  Hydromet 1 tsp At bedtime  As needed  Cough , may make you sleepy .  Follow up Dr. Lamonte Sakai  In 3 months and As needed   Please contact office for sooner follow up if symptoms do not improve or worsen or seek emergency care

## 2014-07-08 ENCOUNTER — Ambulatory Visit: Payer: Medicare Other | Admitting: Oncology

## 2014-07-09 ENCOUNTER — Encounter: Payer: Self-pay | Admitting: Adult Health

## 2014-07-09 ENCOUNTER — Ambulatory Visit (INDEPENDENT_AMBULATORY_CARE_PROVIDER_SITE_OTHER): Payer: Medicare Other | Admitting: Adult Health

## 2014-07-09 VITALS — BP 118/76 | HR 78 | Temp 97.1°F | Ht 70.0 in | Wt 181.2 lb

## 2014-07-09 DIAGNOSIS — J471 Bronchiectasis with (acute) exacerbation: Secondary | ICD-10-CM

## 2014-07-09 MED ORDER — PREDNISONE 10 MG PO TABS: ORAL_TABLET | ORAL | Status: DC

## 2014-07-09 NOTE — Assessment & Plan Note (Signed)
Resolving flare with rhinitis and cough   Plan  Prednisone taper over next week.  Delsym 2 tsp Twice daily  For cough as needed.  Mucinex Twice daily  As needed  Cough/congestion .  Hydromet 1-2  tsp At bedtime  As needed  Cough , may make you sleepy .  May use Chlortrimeton 4mg  2 At bedtime  For drainage for 5 days.  Follow up Dr. Lamonte Sakai  In 2 months and As needed   Please contact office for sooner follow up if symptoms do not improve or worsen or seek emergency care

## 2014-07-09 NOTE — Progress Notes (Signed)
   Subjective:    Patient ID: Eric Brock, male    DOB: 01-10-1941, 73 y.o.   MRN: [id number]  HPI 73 year old male patient of Dr. Baltazar Apo with bilateral middle lobe/lingula bronchiectasis and chronic rhinosinusitis.  07/09/2014 Acute OV  Returns for persistent cough , was seen 12/1 for bronchitis ,/bronchiectasis  tx w/ Levaquin (finished today) and Hydromet.  Feels the cough is unchanged, especially at night.congestion is better and now clear. Cough is keeping him up at night and terrible post nasal drip. He denies any chest pain, orthopnea, PND, leg swelling, fever. No recent travel or antibiotic use. Using mucinex with some help    Review of Systems Constitutional:   No  weight loss, night sweats,  Fevers, chills,  +fatigue, or  lassitude.  HEENT:   No headaches,  Difficulty swallowing,  Tooth/dental problems, or  Sore throat,                No sneezing, itching, ear ache,  +nasal congestion, post nasal drip,   CV:  No chest pain,  Orthopnea, PND, swelling in lower extremities, anasarca, dizziness, palpitations, syncope.   GI  No heartburn, indigestion, abdominal pain, nausea, vomiting, diarrhea, change in bowel habits, loss of appetite, bloody stools.   Resp:   No chest wall deformity  Skin: no rash or lesions.  GU: no dysuria, change in color of urine, no urgency or frequency.  No flank pain, no hematuria   MS:  No joint pain or swelling.  No decreased range of motion.  No back pain.  Psych:  No change in mood or affect. No depression or anxiety.  No memory loss.         Objective:   Physical Exam GEN: A/Ox3; pleasant , NAD, elderly   HEENT:  [place]/AT,  EACs-clear, TMs-wnl, NOSE-clear drainage.  THROAT-clear, no lesions, no postnasal drip or exudate noted.   NECK:  Supple w/ fair ROM; no JVD; normal carotid impulses w/o bruits; no thyromegaly or nodules palpated; no lymphadenopathy.  RESP  CTA , no rhonchi  no accessory muscle use, no dullness to  percussion  CARD:  RRR, no m/r/g  , no peripheral edema, pulses intact, no cyanosis or clubbing.  GI:   Soft & nt; nml bowel sounds; no organomegaly or masses detected.  Musco: Warm bil, no deformities or joint swelling noted.   Neuro: alert, no focal deficits noted.    Skin: Warm, no lesions or rashes        Assessment & Plan:

## 2014-07-09 NOTE — Patient Instructions (Addendum)
Prednisone taper over next week.  Delsym 2 tsp Twice daily  For cough as needed.  Mucinex Twice daily  As needed  Cough/congestion .  Hydromet 1-2  tsp At bedtime  As needed  Cough , may make you sleepy .  May use Chlortrimeton 4mg  2 At bedtime  For drainage for 5 days.  Follow up Dr. Lamonte Sakai  In 2 months and As needed   Please contact office for sooner follow up if symptoms do not improve or worsen or seek emergency care

## 2014-08-09 ENCOUNTER — Telehealth: Payer: Self-pay | Admitting: Oncology

## 2014-08-09 ENCOUNTER — Ambulatory Visit (HOSPITAL_BASED_OUTPATIENT_CLINIC_OR_DEPARTMENT_OTHER): Payer: Medicare Other | Admitting: Oncology

## 2014-08-09 VITALS — BP 138/69 | HR 64 | Temp 97.7°F | Resp 18 | Ht 70.0 in | Wt 180.8 lb

## 2014-08-09 DIAGNOSIS — Z8572 Personal history of non-Hodgkin lymphomas: Secondary | ICD-10-CM

## 2014-08-09 NOTE — Telephone Encounter (Signed)
NO follow up appt at this time. Referral sch see notes in referral.

## 2014-08-09 NOTE — Progress Notes (Signed)
  Eric OFFICE PROGRESS NOTE   Diagnosis: Non-Hodgkin's lymphoma  INTERVAL HISTORY:   Eric Brock returns as scheduled. He feels well. He continues to volunteer at the [employer] He has intermittent flareups of the bronchiectasis. He plays racquetball 3 days per week. He complains of discomfort in the right knee and swelling of the right knee.  Objective:  Vital signs in last 24 hours:  Blood pressure 138/69, pulse 64, temperature 97.7 F (36.5 C), temperature source Oral, resp. rate 18, height 5\' 10"  (1.778 m), weight 180 lb 12.8 oz (82.01 kg), SpO2 98 %.    HEENT: Oropharynx without visible mass or mucosal lesion. Neck without mass Lymphatics: No cervical, supraclavicular, axillary, or inguinal nodes Resp: Scattered wheezes, no respiratory distress Cardio: Regular rate and rhythm GI: No hepatosplenomegaly Vascular: No leg edema  Skin: No evidence of tumor at the frontoparietal scalp. Musculoskeletal: Crepitance with flexion/extension and slight edema surrounding the right knee joint   Medications: I have reviewed the patient's current medications.  Assessment/Plan: 1. Stage IA non-Hodgkin's lymphoma of the scalp diagnosed in March of 2000.  2. Bronchiectasis  3. history of a mildly elevated PSA-followed by Dr. Arnoldo Morale  4. Small erythematous lesion at the left side of the palate in December of 2012-not seen today     Disposition:  Eric Brock remains in clinical remission from non-Hodgkin's lymphoma. He was discharged from the oncology clinic today. We will see him in the future as needed.   He will be referred to orthopedics for evaluation of the right knee pain.  Betsy Coder, MD  08/09/2014  11:49 AM

## 2014-08-13 DIAGNOSIS — H60509 Unspecified acute noninfective otitis externa, unspecified ear: Secondary | ICD-10-CM | POA: Diagnosis not present

## 2014-08-17 DIAGNOSIS — H9202 Otalgia, left ear: Secondary | ICD-10-CM | POA: Diagnosis not present

## 2014-08-19 ENCOUNTER — Ambulatory Visit (INDEPENDENT_AMBULATORY_CARE_PROVIDER_SITE_OTHER): Payer: Medicare Other | Admitting: Family Medicine

## 2014-08-19 ENCOUNTER — Encounter: Payer: Self-pay | Admitting: Family Medicine

## 2014-08-19 VITALS — BP 130/66 | HR 62 | Temp 97.3°F | Wt 181.0 lb

## 2014-08-19 DIAGNOSIS — H65112 Acute and subacute allergic otitis media (mucoid) (sanguinous) (serous), left ear: Secondary | ICD-10-CM

## 2014-08-19 MED ORDER — AMOXICILLIN 875 MG PO TABS: 875.0000 mg | ORAL_TABLET | Freq: Two times a day (BID) | ORAL | Status: DC

## 2014-08-19 NOTE — Patient Instructions (Signed)

## 2014-08-19 NOTE — Progress Notes (Signed)
Pre visit review using our clinic review tool, if applicable. No additional management support is needed unless otherwise documented below in the visit note. 

## 2014-08-19 NOTE — Progress Notes (Signed)
   Subjective:    Patient ID: Eric Brock, male    DOB: 09-12-1940, 74 y.o.   MRN: [id number]  HPI Left ear discomfort. Onset a little over week ago. Initially vessel but itching and ear. Last Wednesday went to urgent care up to [place] and was diagnosed with presumed otitis externa. Started on Ciprodex. Did not see any improvement in Saturday went to another urgent care here and was not treated with any medication other than Zyrtec-D. His pain is somewhat intermittent and sometimes stinging to sharp. Has not noted any rashes. No discharge. No hearing changes. No fever. No chills. No cough. Denies any facial pain.  No recent swimming or flying  Past Medical History  Diagnosis Date  . Allergy   . Hyperlipidemia   . Lymphoma   . Bronchiectasis   . Arthritis    Past Surgical History  Procedure Laterality Date  . Cervical polypectomy    . Cholecystectomy    . Hernia repair      reports that he quit smoking about 39 years ago. His smoking use included Cigarettes. He has a 12 pack-year smoking history. He has never used smokeless tobacco. He reports that he drinks alcohol. He reports that he does not use illicit drugs. family history includes Brain cancer in his mother; Cancer in his brother; Heart disease in his father. No Known Allergies    Review of Systems  Constitutional: Negative for fever and chills.  HENT: Positive for ear pain. Negative for ear discharge, facial swelling, hearing loss and sore throat.   Respiratory: Negative for cough.   Skin: Negative for rash.  Neurological: Negative for headaches.       Objective:   Physical Exam  Constitutional: He appears well-developed and well-nourished.  HENT:  Right Ear: External ear normal.  Mouth/Throat: Oropharynx is clear and moist.  Left eardrum reveals normal landmarks. The superior portion eardrum he has some very erythematous discoloration in this extends to the distal part of the canal. No exudate. No foreign  bodies. No perforation of eardrum  Neck: Neck supple.          Assessment & Plan:  Left ear pain. Differential is resolving otitis externa and suspect component of otitis media. He has some definite bright erythema along the superior portion eardrum. Add amoxicillin 875 mg twice daily for 10 days. Finish out Ciprodex. Follow-up in 2 weeks if no better

## 2014-08-20 DIAGNOSIS — M25561 Pain in right knee: Secondary | ICD-10-CM | POA: Diagnosis not present

## 2014-09-25 ENCOUNTER — Other Ambulatory Visit (INDEPENDENT_AMBULATORY_CARE_PROVIDER_SITE_OTHER): Payer: Medicare Other

## 2014-09-25 DIAGNOSIS — N400 Enlarged prostate without lower urinary tract symptoms: Secondary | ICD-10-CM

## 2014-09-25 LAB — PSA: PSA: 3.18 ng/mL (ref 0.10–4.00)

## 2014-10-02 ENCOUNTER — Encounter: Payer: Self-pay | Admitting: Family Medicine

## 2014-10-02 ENCOUNTER — Ambulatory Visit (INDEPENDENT_AMBULATORY_CARE_PROVIDER_SITE_OTHER): Payer: Medicare Other | Admitting: Family Medicine

## 2014-10-02 VITALS — BP 126/70 | HR 72 | Temp 97.7°F | Wt 177.0 lb

## 2014-10-02 DIAGNOSIS — Z125 Encounter for screening for malignant neoplasm of prostate: Secondary | ICD-10-CM | POA: Diagnosis not present

## 2014-10-02 NOTE — Progress Notes (Signed)
   Subjective:    Patient ID: Eric Brock, male    DOB: Dec 06, 1940, 74 y.o.   MRN: [id number]  HPI  Patient seen for follow-up regarding PSA. Recent PSA 3.24. In looking back over several PSAs in the past 4 years his numbers are essentially unchanged. He was concerned because he had a brother that died of prostate cancer. He has not had any obstructive urinary symptoms. Generally feels well. Still plays racquetball several times per week. He had prostate exam in August which showed mild enlargement but no nodules.  Past Medical History  Diagnosis Date  . Allergy   . Hyperlipidemia   . Lymphoma   . Bronchiectasis   . Arthritis    Past Surgical History  Procedure Laterality Date  . Cervical polypectomy    . Cholecystectomy    . Hernia repair      reports that he quit smoking about 39 years ago. His smoking use included Cigarettes. He has a 12 pack-year smoking history. He has never used smokeless tobacco. He reports that he drinks alcohol. He reports that he does not use illicit drugs. family history includes Brain cancer in his mother; Cancer in his brother; Heart disease in his father. No Known Allergies   Review of Systems  Constitutional: Negative for fever and chills.  Genitourinary: Negative for dysuria, urgency, decreased urine volume and difficulty urinating.       Objective:   Physical Exam  Constitutional: He appears well-developed and well-nourished.  HENT:  Right Ear: External ear normal.  Left Ear: External ear normal.  Cardiovascular: Normal rate and regular rhythm.   Pulmonary/Chest: Effort normal and breath sounds normal. No respiratory distress. He has no wheezes. He has no rales.          Assessment & Plan:  High normal PSA. He has PSA over 3 but this has actually varied very little over the past 4 years. Reassurance. Patient requests free PSA at follow-up in August and this will be scheduled.

## 2014-10-02 NOTE — Progress Notes (Signed)
Pre visit review using our clinic review tool, if applicable. No additional management support is needed unless otherwise documented below in the visit note. 

## 2014-12-31 DIAGNOSIS — M25571 Pain in right ankle and joints of right foot: Secondary | ICD-10-CM | POA: Diagnosis not present

## 2015-03-17 ENCOUNTER — Other Ambulatory Visit (INDEPENDENT_AMBULATORY_CARE_PROVIDER_SITE_OTHER): Payer: Medicare Other

## 2015-03-17 DIAGNOSIS — E785 Hyperlipidemia, unspecified: Secondary | ICD-10-CM | POA: Diagnosis not present

## 2015-03-17 DIAGNOSIS — Z125 Encounter for screening for malignant neoplasm of prostate: Secondary | ICD-10-CM

## 2015-03-17 DIAGNOSIS — Z0000 Encounter for general adult medical examination without abnormal findings: Secondary | ICD-10-CM | POA: Diagnosis not present

## 2015-03-17 LAB — BASIC METABOLIC PANEL
BUN: 14 mg/dL (ref 6–23)
CHLORIDE: 105 meq/L (ref 96–112)
CO2: 29 mEq/L (ref 19–32)
Calcium: 9.7 mg/dL (ref 8.4–10.5)
Creatinine, Ser: 0.96 mg/dL (ref 0.40–1.50)
GFR: 81.8 mL/min (ref >60.00)
Glucose, Bld: 107 mg/dL — ABNORMAL HIGH (ref 70–99)
POTASSIUM: 4.4 meq/L (ref 3.5–5.1)
Sodium: 143 mEq/L (ref 135–145)

## 2015-03-17 LAB — CBC WITH DIFFERENTIAL/PLATELET
BASOS PCT: 0.9 % (ref 0.0–3.0)
Basophils Absolute: 0.1 10*3/uL (ref 0.0–0.1)
EOS PCT: 5.8 % — AB (ref 0.0–5.0)
Eosinophils Absolute: 0.5 10*3/uL (ref 0.0–0.7)
HEMATOCRIT: 46.2 % (ref 39.0–52.0)
HEMOGLOBIN: 15.4 g/dL (ref 13.0–17.0)
LYMPHS PCT: 21.1 % (ref 12.0–46.0)
Lymphs Abs: 1.9 10*3/uL (ref 0.7–4.0)
MCHC: 33.4 g/dL (ref 30.0–36.0)
MCV: 95.1 fl (ref 78.0–100.0)
Monocytes Absolute: 0.6 10*3/uL (ref 0.1–1.0)
Monocytes Relative: 6.4 % (ref 3.0–12.0)
Neutro Abs: 6 10*3/uL (ref 1.4–7.7)
Neutrophils Relative %: 65.8 % (ref 43.0–77.0)
Platelets: 237 10*3/uL (ref 150.0–400.0)
RBC: 4.85 Mil/uL (ref 4.22–5.81)
RDW: 13.6 % (ref 11.5–15.5)
WBC: 9.1 10*3/uL (ref 4.0–10.5)

## 2015-03-17 LAB — HEPATIC FUNCTION PANEL
ALT: 25 U/L (ref 0–53)
AST: 19 U/L (ref 0–37)
Albumin: 4.4 g/dL (ref 3.5–5.2)
Alkaline Phosphatase: 69 U/L (ref 39–117)
Bilirubin, Direct: 0.1 mg/dL (ref 0.0–0.3)
TOTAL PROTEIN: 7.3 g/dL (ref 6.0–8.3)
Total Bilirubin: 0.7 mg/dL (ref 0.2–1.2)

## 2015-03-17 LAB — LIPID PANEL
Cholesterol: 210 mg/dL — ABNORMAL HIGH (ref 0–200)
HDL: 49.8 mg/dL (ref >39.00)
LDL CALC: 140 mg/dL — AB (ref 0–99)
NonHDL: 160.01
Total CHOL/HDL Ratio: 4
Triglycerides: 102 mg/dL (ref 0.0–149.0)
VLDL: 20.4 mg/dL (ref 0.0–40.0)

## 2015-03-17 LAB — TSH: TSH: 2.36 u[IU]/mL (ref 0.35–4.50)

## 2015-03-18 LAB — PSA, TOTAL AND FREE
PSA FREE PCT: 22 % — AB (ref >25)
PSA FREE: 0.93 ng/mL
PSA: 4.14 ng/mL — ABNORMAL HIGH (ref <=4.00)

## 2015-03-19 ENCOUNTER — Encounter: Payer: Self-pay | Admitting: Family Medicine

## 2015-03-24 ENCOUNTER — Ambulatory Visit (INDEPENDENT_AMBULATORY_CARE_PROVIDER_SITE_OTHER): Payer: Medicare Other | Admitting: Family Medicine

## 2015-03-24 ENCOUNTER — Encounter: Payer: Self-pay | Admitting: Family Medicine

## 2015-03-24 VITALS — BP 120/70 | HR 58 | Temp 98.0°F | Ht 69.0 in | Wt 178.0 lb

## 2015-03-24 DIAGNOSIS — R972 Elevated prostate specific antigen [PSA]: Secondary | ICD-10-CM

## 2015-03-24 DIAGNOSIS — Z0000 Encounter for general adult medical examination without abnormal findings: Secondary | ICD-10-CM | POA: Diagnosis not present

## 2015-03-24 DIAGNOSIS — R739 Hyperglycemia, unspecified: Secondary | ICD-10-CM

## 2015-03-24 NOTE — Progress Notes (Signed)
Pre visit review using our clinic review tool, if applicable. No additional management support is needed unless otherwise documented below in the visit note. 

## 2015-03-24 NOTE — Progress Notes (Signed)
Subjective:    Patient ID: Eric Brock, male    DOB: [birth year], 74 y.o.   MRN: [id number]  HPI  patient here for complete physical. He has remote history of non-Hodgkin's lymphoma , hyperlipidemia , remote history of subdural hematoma and osteoarthritis. He's had mildly elevated PSA in the past. Brother had prostate cancer. Patient stays very active with playing racquetball a few times per week. He quit taking Lipitor because of myalgias. Somewhat reluctant to take any other statin medications at this time. Quit smoking many years ago. Tetanus up-to-date. He is due for repeat colonoscopy this December.  Past Medical History  Diagnosis Date  . Allergy   . Hyperlipidemia   . Lymphoma   . Bronchiectasis   . Arthritis    Past Surgical History  Procedure Laterality Date  . Cervical polypectomy    . Cholecystectomy    . Hernia repair      reports that he quit smoking about 39 years ago. His smoking use included Cigarettes. He has a 12 pack-year smoking history. He has never used smokeless tobacco. He reports that he drinks alcohol. He reports that he does not use illicit drugs. family history includes Brain cancer in his mother; Cancer in his brother; Heart disease in his father. No Known Allergies    Review of Systems  Constitutional: Negative for fever, activity change, appetite change and fatigue.  HENT: Negative for congestion, ear pain and trouble swallowing.   Eyes: Negative for pain and visual disturbance.  Respiratory: Negative for cough, shortness of breath and wheezing.   Cardiovascular: Negative for chest pain and palpitations.  Gastrointestinal: Negative for nausea, vomiting, abdominal pain, diarrhea, constipation, blood in stool, abdominal distention and rectal pain.  Genitourinary: Negative for dysuria, hematuria and testicular pain.  Musculoskeletal: Positive for arthralgias. Negative for joint swelling.  Skin: Negative for rash.  Neurological: Negative for  dizziness, syncope and headaches.  Hematological: Negative for adenopathy.  Psychiatric/Behavioral: Negative for confusion and dysphoric mood.       Objective:   Physical Exam  Constitutional: He is oriented to person, place, and time. He appears well-developed and well-nourished. No distress.  HENT:  Head: Normocephalic and atraumatic.  Right Ear: External ear normal.  Left Ear: External ear normal.  Mouth/Throat: Oropharynx is clear and moist.  Eyes: Conjunctivae and EOM are normal. Pupils are equal, round, and reactive to light.  Neck: Normal range of motion. Neck supple. No thyromegaly present.  Cardiovascular: Normal rate, regular rhythm and normal heart sounds.   No murmur heard. Pulmonary/Chest: No respiratory distress. He has no wheezes. He has no rales.  Abdominal: Soft. Bowel sounds are normal. He exhibits no distension and no mass. There is no tenderness. There is no rebound and no guarding.  Genitourinary:  Prostate is not particularly enlarged. No specific nodules. Right lobe- question slightly firmer to palpation than left. No rectal masses.  Musculoskeletal: He exhibits no edema.  Lymphadenopathy:    He has no cervical adenopathy.  Neurological: He is alert and oriented to person, place, and time. He displays normal reflexes. No cranial nerve deficit.  Skin: No rash noted.  Psychiatric: He has a normal mood and affect.          Assessment & Plan:  Complete physical. Labs reviewed. Minimally elevated lipids. We discussed initiating another statin such as Crestor but at this point he is reluctant. Schedule repeat colonoscopy. He has mildly elevated PSA and we agreed on repeat in 4 months. If still elevating at  that point consider urology referr He plans to get flu vaccine in 1-2 monthsal.

## 2015-03-24 NOTE — Patient Instructions (Signed)
Remember flu vaccine for this Fall.  We will call you regarding colonoscopy

## 2015-05-07 ENCOUNTER — Encounter: Payer: Self-pay | Admitting: Gastroenterology

## 2015-05-14 ENCOUNTER — Ambulatory Visit: Payer: Medicare Other | Admitting: Family Medicine

## 2015-05-15 ENCOUNTER — Ambulatory Visit (INDEPENDENT_AMBULATORY_CARE_PROVIDER_SITE_OTHER): Payer: Medicare Other

## 2015-05-15 DIAGNOSIS — Z23 Encounter for immunization: Secondary | ICD-10-CM | POA: Diagnosis not present

## 2015-05-31 ENCOUNTER — Other Ambulatory Visit: Payer: Self-pay | Admitting: Emergency Medicine

## 2015-06-12 DIAGNOSIS — M1711 Unilateral primary osteoarthritis, right knee: Secondary | ICD-10-CM | POA: Diagnosis not present

## 2015-06-20 ENCOUNTER — Encounter: Payer: Self-pay | Admitting: Pulmonary Disease

## 2015-06-20 ENCOUNTER — Ambulatory Visit (INDEPENDENT_AMBULATORY_CARE_PROVIDER_SITE_OTHER): Payer: Medicare Other | Admitting: Pulmonary Disease

## 2015-06-20 VITALS — BP 116/70 | HR 61 | Ht 70.0 in | Wt 181.4 lb

## 2015-06-20 DIAGNOSIS — J471 Bronchiectasis with (acute) exacerbation: Secondary | ICD-10-CM

## 2015-06-20 MED ORDER — PREDNISONE 10 MG PO TABS: ORAL_TABLET | ORAL | Status: DC

## 2015-06-20 MED ORDER — LEVOFLOXACIN 500 MG PO TABS: 500.0000 mg | ORAL_TABLET | Freq: Every day | ORAL | Status: DC

## 2015-06-20 NOTE — Patient Instructions (Addendum)
We will give you a prescription for Levaquin 500 milligrams once daily for 7 days And a prednisone taper taper starting at 40 mg. Please reduce dose by 10 mg every 3 days. Use these medications if you feel that your bronchiectasis exacerbation is getting worse. Use the Acapella valve regularly to clear secretions  Follow-up with Dr. Lamonte Sakai

## 2015-06-20 NOTE — Progress Notes (Signed)
   Subjective:    Patient ID: Eric Brock, male    DOB: [birth year], 74 y.o.   MRN: [id number]  HPI Acute visit for bronchiectasis exacerbation.  Eric Brock is a 74 year old with bronchiectasis. He is a patient of Dr. Lamonte Sakai but has not seen him in over a year. He's had a couple of exacerbations last year treated with Levaquin and prednisone.  He returns today with an exacerbation. Symptoms initially started with sinusitis and rhinitis and proceeded to chest congestion and cough with clear/green sputum. He denies any fevers chills shortness of breath or wheezing. He wants some medication at hand if his symptoms worsen since holidays are coming up.  Past Medical History  Diagnosis Date  . Allergy   . Hyperlipidemia   . Lymphoma ([place])   . Bronchiectasis   . Arthritis     Current outpatient prescriptions:  .  fexofenadine-pseudoephedrine (ALLEGRA-D 24) 180-240 MG per 24 hr tablet, Take 1 tablet by mouth daily., Disp: , Rfl:  .  fluticasone (FLONASE) 50 MCG/ACT nasal spray, PLACE 2 SPRAYS INTO BOTH NOSTRILS 2 (TWO) TIMES DAILY., Disp: 16 g, Rfl: 4 .  guaiFENesin (MUCINEX) 600 MG 12 hr tablet, Take 1,200 mg by mouth 2 (two) times daily. , Disp: , Rfl:  .  ibuprofen (ADVIL,MOTRIN) 200 MG tablet, Take 200 mg by mouth every 6 (six) hours as needed. , Disp: , Rfl:  .  Multiple Vitamins-Minerals (MULTIVITAMIN ADULT PO), Take by mouth., Disp: , Rfl:    Review of Systems  + Sinus pressure, nasal discharge, postnasal drip. Cough with clear/green sputum. Denies any fevers chills shortness of breath wheezing. She denies any chest pain, palpitations. Denies any nausea, vomiting, diarrhea, constipation. All other review of systems are negative.      Objective:   Physical Exam  Blood pressure 116/70, pulse 61, height 5\' 10"  (1.778 m), weight 181 lb 6.4 oz (82.283 kg), SpO2 96 %.  Gen: No apparent distress Neuro: No gross focal deficits. Neck: No JVD, lymphadenopathy, thyromegaly. RS:  Clear, no wheeze or crackles, nonlabored breathing. CVS: S1-S2 heard, no murmurs rubs gallops. Abdomen: Soft, positive bowel sounds. Extremities: No edema.    Assessment & Plan:   Bronchiectasis with mild exacerbation.   He has a mild exacerbation of bronchiectasis set off by bout of sinusitis and rhinitis. He is actually not very symptomatic. He is asking for a prescription of antibiotics and steroids in case his symptoms should worsen. He is worried that he might need them over the holidays. He has an Acapella while at home but does not use it regularly.  Plan: L-evaquin 500 milligram 7 days  -Prednisone taper starting at 40 mg. Reduce by 10 mg every 3 days.  -I asked him to be more compliant with his Acapella for adequate clearance.   Eric Brock  Pulmonary and Critical Care Pager [phone] If no answer or after 3pm call: [phone] 06/20/2015, 2:18 PM

## 2015-06-30 ENCOUNTER — Ambulatory Visit (AMBULATORY_SURGERY_CENTER): Payer: Self-pay | Admitting: *Deleted

## 2015-06-30 VITALS — Ht 70.0 in | Wt 179.4 lb

## 2015-06-30 DIAGNOSIS — Z86010 Personal history of colonic polyps: Secondary | ICD-10-CM

## 2015-06-30 NOTE — Progress Notes (Signed)
Patient denies any allergies to egg or soy products. Patient denies complications with anesthesia/sedation.  Patient denies oxygen use at home and denies diet medications. Emmi instructions for colonoscopy but patient denied.   

## 2015-07-03 ENCOUNTER — Encounter: Payer: Self-pay | Admitting: Gastroenterology

## 2015-07-14 ENCOUNTER — Encounter: Payer: Self-pay | Admitting: Gastroenterology

## 2015-07-14 ENCOUNTER — Ambulatory Visit (AMBULATORY_SURGERY_CENTER): Payer: Medicare Other | Admitting: Gastroenterology

## 2015-07-14 ENCOUNTER — Encounter: Payer: Self-pay | Admitting: Family Medicine

## 2015-07-14 VITALS — BP 117/52 | HR 59 | Temp 97.5°F | Resp 21 | Ht 70.0 in | Wt 179.0 lb

## 2015-07-14 DIAGNOSIS — Z1211 Encounter for screening for malignant neoplasm of colon: Secondary | ICD-10-CM | POA: Diagnosis present

## 2015-07-14 DIAGNOSIS — Z86010 Personal history of colonic polyps: Secondary | ICD-10-CM

## 2015-07-14 MED ORDER — SODIUM CHLORIDE 0.9 % IV SOLN: 500.0000 mL | INTRAVENOUS | Status: DC

## 2015-07-14 NOTE — Progress Notes (Signed)
Patient denies any allergies to eggs or soy. 

## 2015-07-14 NOTE — Patient Instructions (Signed)
YOU HAD AN ENDOSCOPIC PROCEDURE TODAY AT THE [facility]:   Refer to the procedure report that was given to you for any specific questions about what was found during the examination.  If the procedure report does not answer your questions, please call your gastroenterologist to clarify.  If you requested that your care partner not be given the details of your procedure findings, then the procedure report has been included in a sealed envelope for you to review at your convenience later.  YOU SHOULD EXPECT: Some feelings of bloating in the abdomen. Passage of more gas than usual.  Walking can help get rid of the air that was put into your GI tract during the procedure and reduce the bloating. If you had a lower endoscopy (such as a colonoscopy or flexible sigmoidoscopy) you may notice spotting of blood in your stool or on the toilet paper. If you underwent a bowel prep for your procedure, you may not have a normal bowel movement for a few days.  Please Note:  You might notice some irritation and congestion in your nose or some drainage.  This is from the oxygen used during your procedure.  There is no need for concern and it should clear up in a day or so.  SYMPTOMS TO REPORT IMMEDIATELY:   Following lower endoscopy (colonoscopy or flexible sigmoidoscopy):  Excessive amounts of blood in the stool  Significant tenderness or worsening of abdominal pains  Swelling of the abdomen that is new, acute  Fever of 100F or higher  For urgent or emergent issues, a gastroenterologist can be reached at any hour by calling [phone].   DIET: Your first meal following the procedure should be a small meal and then it is ok to progress to your normal diet. Heavy or fried foods are harder to digest and may make you feel nauseous or bloated.  Likewise, meals heavy in dairy and vegetables can increase bloating.  Drink plenty of fluids but you should avoid alcoholic beverages for 24  hours.  ACTIVITY:  You should plan to take it easy for the rest of today and you should NOT DRIVE or use heavy machinery until tomorrow (because of the sedation medicines used during the test).    FOLLOW UP: Our staff will call the number listed on your records the next business day following your procedure to check on you and address any questions or concerns that you may have regarding the information given to you following your procedure. If we do not reach you, we will leave a message.  However, if you are feeling well and you are not experiencing any problems, there is no need to return our call.  We will assume that you have returned to your regular daily activities without incident.  If any biopsies were taken you will be contacted by phone or by letter within the next 1-3 weeks.  Please call us at [phone] if you have not heard about the biopsies in 3 weeks.    SIGNATURES/CONFIDENTIALITY: You and/or your care partner have signed paperwork which will be entered into your electronic medical record.  These signatures attest to the fact that that the information above on your After Visit Summary has been reviewed and is understood.  Full responsibility of the confidentiality of this discharge information lies with you and/or your care-partner.   Handout was given to your care partner on hemorrhoids. You may resume your current medications today. Please call if any questions or concerns.     concerns.

## 2015-07-14 NOTE — Progress Notes (Signed)
No problems noted in the recovery room. maw 

## 2015-07-14 NOTE — Progress Notes (Signed)
A/ox3, pleased with MAC, report to RN 

## 2015-07-14 NOTE — Op Note (Signed)
[facility]  [address]. [place], [id number]   COLONOSCOPY PROCEDURE REPORT  PATIENT: Eric, Brock  MR#: [id number] BIRTHDATE: 11-01-1940 , 42  yrs. old GENDER: male ENDOSCOPIST: Yetta Flock, MD REFERRED BY: Carolann Littler MD PROCEDURE DATE:  07/14/2015 PROCEDURE:   Colonoscopy, screening First Screening Colonoscopy - Avg.  risk and is 50 yrs.  old or older - No.  Prior Negative Screening - Now for repeat screening. 10 or more years since last screening  History of Adenoma - Now for follow-up colonoscopy & has been > or = to 3 yrs.  N/A  Polyps removed today? No Recommend repeat exam, <10 yrs? No ASA CLASS:   Class II INDICATIONS:Screening for colonic neoplasia and Colorectal Neoplasm Risk Assessment for this procedure is average risk. MEDICATIONS: Propofol 350 mg IV  DESCRIPTION OF PROCEDURE:   After the risks benefits and alternatives of the procedure were thoroughly explained, informed consent was obtained.  The digital rectal exam revealed no abnormalities of the rectum.   The LB [id number]  endoscope was introduced through the anus and advanced to the cecum, which was identified by both the appendix and ileocecal valve. No adverse events experienced.   The quality of the prep was adequate  The instrument was then slowly withdrawn as the colon was fully examined. Estimated blood loss is zero unless otherwise noted in this procedure report.   COLON FINDINGS: The colon was extremely tortous. A normal appearing cecum, ileocecal valve, and appendiceal orifice were otherwise identified.  The ascending, transverse, descending, sigmoid colon, and rectum appeared unremarkable.  No polyps appreciated. Retroflexed views revealed small internal hemorrhoids. The time to cecum = 8.1 Withdrawal time = 11.1   The scope was withdrawn and the procedure completed. COMPLICATIONS: There were no immediate complications.  ENDOSCOPIC IMPRESSION: Tortous  colon Otherwise normal colonoscopy without polyps  RECOMMENDATIONS: Resume diet Resume medications May consider repeating colon cancer screening in 10 years, however given your age at that time, it would also be reasonable to stop further colon cancer screening  eSigned:  Yetta Flock, MD 07/14/2015 9:53 AM   cc: Carolann Littler MD, the patient

## 2015-07-15 ENCOUNTER — Telehealth: Payer: Self-pay | Admitting: *Deleted

## 2015-07-15 NOTE — Telephone Encounter (Signed)
  Follow up Call-  Call back number 07/14/2015  Post procedure Call Back phone  # [phone]  Permission to leave phone message Yes     Patient questions:  Do you have a fever, pain , or abdominal swelling? No. Pain Score  0 *  Have you tolerated food without any problems? Yes.    Have you been able to return to your normal activities? Yes.    Do you have any questions about your discharge instructions: Diet   No. Medications  No. Follow up visit  No.  Do you have questions or concerns about your Care? No.  Actions: * If pain score is 4 or above: No action needed, pain <4.

## 2015-07-23 ENCOUNTER — Other Ambulatory Visit (INDEPENDENT_AMBULATORY_CARE_PROVIDER_SITE_OTHER): Payer: Medicare Other

## 2015-07-23 DIAGNOSIS — R739 Hyperglycemia, unspecified: Secondary | ICD-10-CM | POA: Diagnosis not present

## 2015-07-23 DIAGNOSIS — R972 Elevated prostate specific antigen [PSA]: Secondary | ICD-10-CM

## 2015-07-23 LAB — BASIC METABOLIC PANEL
BUN: 17 mg/dL (ref 6–23)
CALCIUM: 9.5 mg/dL (ref 8.4–10.5)
CO2: 28 meq/L (ref 19–32)
Chloride: 106 mEq/L (ref 96–112)
Creatinine, Ser: 0.98 mg/dL (ref 0.40–1.50)
GFR: 79.8 mL/min (ref >60.00)
Glucose, Bld: 135 mg/dL — ABNORMAL HIGH (ref 70–99)
POTASSIUM: 4.1 meq/L (ref 3.5–5.1)
SODIUM: 143 meq/L (ref 135–145)

## 2015-07-24 LAB — PSA, TOTAL AND FREE
PSA FREE: 0.86 ng/mL
PSA, Free Pct: 23 % — ABNORMAL LOW (ref >25)
PSA: 3.74 ng/mL (ref <=4.00)

## 2015-09-23 ENCOUNTER — Other Ambulatory Visit (INDEPENDENT_AMBULATORY_CARE_PROVIDER_SITE_OTHER): Payer: Medicare Other

## 2015-09-23 DIAGNOSIS — R739 Hyperglycemia, unspecified: Secondary | ICD-10-CM

## 2015-09-23 DIAGNOSIS — R972 Elevated prostate specific antigen [PSA]: Secondary | ICD-10-CM

## 2015-09-23 LAB — HEMOGLOBIN A1C: HEMOGLOBIN A1C: 5.6 % (ref 4.6–6.5)

## 2015-09-23 LAB — PSA, MEDICARE: PSA: 4.2 ng/mL — AB (ref 0.10–4.00)

## 2015-09-24 LAB — GLUCOSE, FASTING: Glucose, Fasting: 91 mg/dL (ref 65–99)

## 2015-09-26 ENCOUNTER — Other Ambulatory Visit: Payer: Self-pay | Admitting: Family Medicine

## 2015-09-26 DIAGNOSIS — R972 Elevated prostate specific antigen [PSA]: Secondary | ICD-10-CM

## 2015-09-29 ENCOUNTER — Encounter: Payer: Self-pay | Admitting: Family Medicine

## 2015-11-13 DIAGNOSIS — N403 Nodular prostate with lower urinary tract symptoms: Secondary | ICD-10-CM | POA: Diagnosis not present

## 2015-11-13 DIAGNOSIS — R972 Elevated prostate specific antigen [PSA]: Secondary | ICD-10-CM | POA: Diagnosis not present

## 2015-11-20 ENCOUNTER — Other Ambulatory Visit: Payer: Self-pay | Admitting: Urology

## 2015-11-20 DIAGNOSIS — C61 Malignant neoplasm of prostate: Secondary | ICD-10-CM

## 2015-12-03 ENCOUNTER — Other Ambulatory Visit: Payer: Self-pay | Admitting: Urology

## 2015-12-03 DIAGNOSIS — C61 Malignant neoplasm of prostate: Secondary | ICD-10-CM

## 2016-01-06 ENCOUNTER — Encounter (HOSPITAL_BASED_OUTPATIENT_CLINIC_OR_DEPARTMENT_OTHER): Payer: Self-pay | Admitting: *Deleted

## 2016-01-07 ENCOUNTER — Encounter (HOSPITAL_BASED_OUTPATIENT_CLINIC_OR_DEPARTMENT_OTHER): Payer: Self-pay | Admitting: *Deleted

## 2016-01-07 NOTE — Progress Notes (Signed)
NPO AFTER MN.  ARRIVE AT 1100.  NEEDS ISTAT 8.  WILL TAKE AM MEDS DOS W/ SIPS OF WATER.  WILL DO FLEET ENEMA AM DOS.

## 2016-01-12 ENCOUNTER — Ambulatory Visit (HOSPITAL_BASED_OUTPATIENT_CLINIC_OR_DEPARTMENT_OTHER): Payer: Medicare Other | Admitting: Anesthesiology

## 2016-01-12 ENCOUNTER — Encounter (HOSPITAL_BASED_OUTPATIENT_CLINIC_OR_DEPARTMENT_OTHER): Payer: Self-pay | Admitting: Anesthesiology

## 2016-01-12 ENCOUNTER — Ambulatory Visit (HOSPITAL_COMMUNITY)
Admission: RE | Admit: 2016-01-12 | Discharge: 2016-01-12 | Disposition: A | Discharge status: Home / Self Care | Payer: Medicare Other | Source: Ambulatory Visit | Attending: Urology | Admitting: Urology

## 2016-01-12 ENCOUNTER — Encounter (HOSPITAL_BASED_OUTPATIENT_CLINIC_OR_DEPARTMENT_OTHER)
Admission: RE | Disposition: A | Discharge status: Home / Self Care | Payer: Self-pay | Source: Ambulatory Visit | Attending: Urology

## 2016-01-12 ENCOUNTER — Other Ambulatory Visit: Payer: Medicare Other | Admitting: Family Medicine

## 2016-01-12 DIAGNOSIS — Z79899 Other long term (current) drug therapy: Secondary | ICD-10-CM | POA: Diagnosis not present

## 2016-01-12 DIAGNOSIS — C61 Malignant neoplasm of prostate: Secondary | ICD-10-CM | POA: Diagnosis not present

## 2016-01-12 DIAGNOSIS — Z87891 Personal history of nicotine dependence: Secondary | ICD-10-CM | POA: Insufficient documentation

## 2016-01-12 DIAGNOSIS — Z8572 Personal history of non-Hodgkin lymphomas: Secondary | ICD-10-CM | POA: Insufficient documentation

## 2016-01-12 DIAGNOSIS — Z791 Long term (current) use of non-steroidal anti-inflammatories (NSAID): Secondary | ICD-10-CM | POA: Insufficient documentation

## 2016-01-12 DIAGNOSIS — R972 Elevated prostate specific antigen [PSA]: Secondary | ICD-10-CM | POA: Insufficient documentation

## 2016-01-12 DIAGNOSIS — M1990 Unspecified osteoarthritis, unspecified site: Secondary | ICD-10-CM | POA: Diagnosis not present

## 2016-01-12 HISTORY — DX: Personal history of colon polyps, unspecified: Z86.0100

## 2016-01-12 HISTORY — DX: Allergic rhinitis, unspecified: J30.9

## 2016-01-12 HISTORY — PX: PROSTATE BIOPSY: SHX241

## 2016-01-12 HISTORY — DX: Unspecified symptoms and signs involving the genitourinary system: R39.9

## 2016-01-12 HISTORY — DX: Presence of spectacles and contact lenses: Z97.3

## 2016-01-12 HISTORY — DX: Personal history of colonic polyps: Z86.010

## 2016-01-12 HISTORY — DX: Personal history of traumatic brain injury: Z87.820

## 2016-01-12 HISTORY — DX: Personal history of non-Hodgkin lymphomas: Z85.72

## 2016-01-12 HISTORY — DX: Elevated prostate specific antigen (PSA): R97.20

## 2016-01-12 LAB — POCT I-STAT, CHEM 8
BUN: 15 mg/dL (ref 6–20)
CHLORIDE: 104 mmol/L (ref 101–111)
Calcium, Ion: 1.23 mmol/L (ref 1.13–1.30)
Creatinine, Ser: 0.9 mg/dL (ref 0.61–1.24)
GLUCOSE: 102 mg/dL — AB (ref 65–99)
HCT: 46 % (ref 39.0–52.0)
HEMOGLOBIN: 15.6 g/dL (ref 13.0–17.0)
POTASSIUM: 3.8 mmol/L (ref 3.5–5.1)
Sodium: 143 mmol/L (ref 135–145)
TCO2: 25 mmol/L (ref 0–100)

## 2016-01-12 SURGERY — BIOPSY, PROSTATE, RECTAL APPROACH, WITH US GUIDANCE
Anesthesia: Monitor Anesthesia Care

## 2016-01-12 MED ORDER — PROPOFOL 10 MG/ML IV BOLUS
INTRAVENOUS | Status: DC | PRN
  Administered 2016-01-12: 40 mg via INTRAVENOUS
  Administered 2016-01-12: 30 mg via INTRAVENOUS

## 2016-01-12 MED ORDER — PROPOFOL 10 MG/ML IV BOLUS
INTRAVENOUS | Status: AC
  Filled 2016-01-12: qty 20

## 2016-01-12 MED ORDER — LIDOCAINE HCL 2 % IJ SOLN
INTRAMUSCULAR | Status: DC | PRN
  Administered 2016-01-12: 10 mL

## 2016-01-12 MED ORDER — GENTAMICIN SULFATE 40 MG/ML IJ SOLN
5.0000 mg/kg | INTRAVENOUS | Status: AC
  Administered 2016-01-12: 400 mg via INTRAVENOUS
  Filled 2016-01-12: qty 10

## 2016-01-12 MED ORDER — LACTATED RINGERS IV SOLN
INTRAVENOUS | Status: DC
  Administered 2016-01-12: 13:00:00 via INTRAVENOUS
  Filled 2016-01-12: qty 1000

## 2016-01-12 MED ORDER — GENTAMICIN IN SALINE 1.6-0.9 MG/ML-% IV SOLN
80.0000 mg | INTRAVENOUS | Status: DC
  Filled 2016-01-12: qty 50

## 2016-01-12 MED ORDER — LIDOCAINE HCL (CARDIAC) 20 MG/ML IV SOLN
INTRAVENOUS | Status: AC
  Filled 2016-01-12: qty 5

## 2016-01-12 MED ORDER — FENTANYL CITRATE (PF) 100 MCG/2ML IJ SOLN
INTRAMUSCULAR | Status: DC | PRN
  Administered 2016-01-12 (×2): 50 ug via INTRAVENOUS

## 2016-01-12 MED ORDER — LIDOCAINE HCL (CARDIAC) 20 MG/ML IV SOLN
INTRAVENOUS | Status: DC | PRN
  Administered 2016-01-12: 50 mg via INTRAVENOUS

## 2016-01-12 MED ORDER — FENTANYL CITRATE (PF) 100 MCG/2ML IJ SOLN
INTRAMUSCULAR | Status: AC
  Filled 2016-01-12: qty 2

## 2016-01-12 SURGICAL SUPPLY — 9 items
INST BIOPSY MAXCORE 18GX25 (NEEDLE) ×1 IMPLANT
INSTR BIOPSY MAXCORE 18GX20 (NEEDLE) IMPLANT
KIT ROOM TURNOVER WOR (KITS) ×2 IMPLANT
NDL SAFETY ECLIPSE 18X1.5 (NEEDLE) IMPLANT
NEEDLE HYPO 18GX1.5 SHARP (NEEDLE) ×2
NEEDLE SPNL 22GX7 QUINCKE BK (NEEDLE) ×2 IMPLANT
SURGILUBE 2OZ TUBE FLIPTOP (MISCELLANEOUS) ×2 IMPLANT
SYR CONTROL 10ML LL (SYRINGE) ×1 IMPLANT
UNDERPAD 30X30 INCONTINENT (UNDERPADS AND DIAPERS) ×3 IMPLANT

## 2016-01-12 NOTE — Anesthesia Procedure Notes (Signed)
Procedure Name: MAC Date/Time: 01/12/2016 1:30 PM Performed by: Bethena Roys T Oxygen Delivery Method: Simple face mask Placement Confirmation: positive ETCO2

## 2016-01-12 NOTE — Op Note (Signed)
Pre op diagnosis: Elevated PSA  Post op diagnosis: Elevated PSA        Procedure: Transrectal ultrasound of the prostate, Ultrasound Guided Prostate needle biopsy.   Attending: Nicolette Bang  Anesthesia: General  EBL: minimal  Antibiotics: Gentamicin  Drains: none  Findings: 80g prostate with prominent right lobe. Prostate measurements: width 5.6cm, height 6.2cm, length 5.7cm no hypoechoic or hyperechoic lesions   Indications: Pt is a 75yo male with a history of elevated PSA and abnormal DRE. After discussing workup he has elected to proceed with prostate biopsy  Procedure in detail: Prior to procedure consent was obtained. The patient was brought to the OR and a brief timeout was done to ensure correct patient, correct procedure, and correct site. General anesthesia was administered and patient was placed in the dorsal lithotomy position. The prostate size was estimated to be 40g by digital rectal exam. The 10 MHz transrectal ultrasound probe was placed into the rectum. Prostate width measured 3.56cm, height of 3cm and length of 3.1cm . Prostate volume was measured to be 35 cc. The biopsy cores were obtained using direct, real-time ultrasound guidance utilizing a standard 14core pattern with one core from the right apex lateral using real time ultrasound guidance to direct the biopsy core being taken from this location, right apex medial using real time ultrasound guidance to direct the biopsy core being taken from this location, left apex lateral using real time ultrasound guidance to direct the biopsy core being taken from this location, left apex medial using real time ultrasound guidance to direct the biopsy core being taken from this location, 2 right mid lateral using real time ultrasound guidance to direct the biopsy core being taken from this location, 2 right mid medial using real time ultrasound guidance to direct the biopsy core being taken from this location, left mid lateral  using real time ultrasound guidance to direct the biopsy core being taken from this location, left mid medial using real time ultrasound guidance to direct the biopsy core being taken from this location, right base lateral using real time ultrasound guidance to direct the biopsy core being taken from this location, right base medial using real time ultrasound guidance to direct the biopsy core being taken from this location, left base lateral using real time ultrasound guidance to direct the biopsy core being taken from this location and left base medial of the prostate using real time ultrasound guidance to direct the biopsy core being taken from this location. The biopsy cores were placed in buffered formalin and sent to pathology. This then concluded the procedure which was well tolerated by the patient.   Complications:. None.   Condition: Stable, extubated, transferred to PACU  Plan: Patient is to be discharged home. They are to followup in 1 week for pathology discussion

## 2016-01-12 NOTE — Anesthesia Preprocedure Evaluation (Addendum)
Anesthesia Evaluation  Patient identified by MRN, date of birth, ID band Patient awake    Reviewed: Allergy & Precautions, H&P , NPO status , Patient's Chart, lab work & pertinent test results  Airway Mallampati: II  TM Distance: >3 FB Neck ROM: Full    Dental  (+) Teeth Intact, Dental Advisory Given   Pulmonary former smoker,    breath sounds clear to auscultation       Cardiovascular negative cardio ROS   Rhythm:Regular Rate:Normal     Neuro/Psych negative neurological ROS  negative psych ROS   GI/Hepatic negative GI ROS, Neg liver ROS,   Endo/Other  negative endocrine ROS  Renal/GU negative Renal ROS     Musculoskeletal negative musculoskeletal ROS (+) Arthritis ,   Abdominal   Peds  Hematology negative hematology ROS (+)   Anesthesia Other Findings   Reproductive/Obstetrics negative OB ROS                          Anesthesia Physical Anesthesia Plan  ASA: II  Anesthesia Plan: MAC   Post-op Pain Management:    Induction: Intravenous  Airway Management Planned: Simple Face Mask  Additional Equipment:   Intra-op Plan:   Post-operative Plan:   Informed Consent: I have reviewed the patients History and Physical, chart, labs and discussed the procedure including the risks, benefits and alternatives for the proposed anesthesia with the patient or authorized representative who has indicated his/her understanding and acceptance.   Dental advisory given  Plan Discussed with: CRNA  Anesthesia Plan Comments:       Anesthesia Quick Evaluation

## 2016-01-12 NOTE — Transfer of Care (Signed)
Immediate Anesthesia Transfer of Care Note  Patient: Eric Brock  Procedure(s) Performed: Procedure(s): BIOPSY TRANSRECTAL ULTRASONIC PROSTATE (TUBP) (N/A)  Patient Location: PACU and Short Stay  Anesthesia Type:MAC  Level of Consciousness: awake, alert  and oriented  Airway & Oxygen Therapy: Patient Spontanous Breathing  Post-op Assessment: Report given to RN  Post vital signs: Reviewed and stable  Last Vitals: 140/75, 64, 16, 97%, 98.3 Filed Vitals:   01/12/16 1119  BP: 141/70  Pulse: 60  Temp: 37.1 C  Resp: 16    Last Pain: There were no vitals filed for this visit.    Patients Stated Pain Goal: 0 ([id number])  Complications: No apparent anesthesia complications

## 2016-01-12 NOTE — Anesthesia Postprocedure Evaluation (Signed)
Anesthesia Post Note  Patient: Eric Brock  Procedure(s) Performed: Procedure(s) (LRB): BIOPSY TRANSRECTAL ULTRASONIC PROSTATE (TUBP) (N/A)  Patient location during evaluation: PACU Anesthesia Type: MAC Level of consciousness: awake and alert Pain management: pain level controlled Vital Signs Assessment: post-procedure vital signs reviewed and stable Respiratory status: spontaneous breathing, nonlabored ventilation and respiratory function stable Cardiovascular status: stable and blood pressure returned to baseline Anesthetic complications: no    Last Vitals:  Filed Vitals:   01/12/16 1119 01/12/16 1354  BP: 141/70 140/75  Pulse: 60 64  Temp: 37.1 C 36.8 C  Resp: 16 16    Last Pain: There were no vitals filed for this visit.               ,W. EDMOND

## 2016-01-12 NOTE — H&P (Signed)
Urology Admission H&P  Chief Complaint: elevated PSA  History of Present Illness: Eric Brock is a 75yo with a hx of elevated PSA. Due to anxiety he refused prostate biopsy in the office  Past Medical History  Diagnosis Date  . Bronchiectasis pulmologist-  dr Vaughan Browner ([place])    intermittant--  per pt last bout yr ago 2016  . Hyperlipidemia     diet controlled, no med  . History of non-Hodgkin's lymphoma oncologist-  dr Benay Spice--  pt in remission    dx 03/ 2000  ,  Stage IA,  scalp/forehead ,  post chemo/radioation therapy  . Allergic rhinitis   . History of colon polyps     1999  . Elevated PSA   . History of concussion     2005 and 2010 with mild cerebral bleed resolved -no residual  . Lower urinary tract symptoms (LUTS)   . Arthritis   . Wears glasses    Past Surgical History  Procedure Laterality Date  . Cataract extraction w/ intraocular lens  implant, bilateral  2013  . Umbilical hernia repair  1992  . Cholecystectomy  1994  . Colonoscopy  last one 07-14-2015    Home Medications:  Prescriptions prior to admission  Medication Sig Dispense Refill Last Dose  . fexofenadine (ALLEGRA) 180 MG tablet Take 180 mg by mouth every morning.   Past Week at Unknown time  . GuaiFENesin (MUCINEX PO) Take 1,200 mg by mouth 2 (two) times daily. Extended release   01/11/2016 at Unknown time  . Multiple Vitamins-Minerals (CENTRUM ADULTS PO) Take 1 tablet by mouth daily.   Past Week at Unknown time  . tamsulosin (FLOMAX) 0.4 MG CAPS capsule Take 0.4 mg by mouth daily after supper.    01/11/2016 at Unknown time  . ibuprofen (ADVIL,MOTRIN) 200 MG tablet Take 200 mg by mouth every 6 (six) hours as needed.    More than a month at Unknown time  . Naproxen Sodium (ALEVE) 220 MG CAPS Take by mouth as needed.   More than a month at Unknown time   Allergies: No Known Allergies  Family History  Problem Relation Age of Onset  . Brain cancer Mother   . Heart disease Father   . Cancer Brother    prostate  . Colon cancer Neg Hx   . Colon polyps Neg Hx   . Esophageal cancer Neg Hx   . Rectal cancer Neg Hx   . Stomach cancer Neg Hx    Social History:  reports that he quit smoking about 40 years ago. His smoking use included Cigarettes. He has a 12 pack-year smoking history. He has never used smokeless tobacco. He reports that he drinks about 4.2 oz of alcohol per week. He reports that he does not use illicit drugs.  Review of Systems  All other systems reviewed and are negative.   Physical Exam:  Vital signs in last 24 hours: Temp:  [98.7 F (37.1 C)] 98.7 F (37.1 C) (06/12 1119) Pulse Rate:  [60] 60 (06/12 1119) Resp:  [16] 16 (06/12 1119) BP: (141)/(70) 141/70 mmHg (06/12 1119) SpO2:  [97 %] 97 % (06/12 1119) Weight:  [79.833 kg (176 lb)] 79.833 kg (176 lb) (06/12 1119) Physical Exam  Constitutional: He is oriented to person, place, and time. He appears well-developed and well-nourished.  HENT:  Head: Normocephalic and atraumatic.  Eyes: EOM are normal. Pupils are equal, round, and reactive to light.  Neck: Normal range of motion. No thyromegaly present.  Cardiovascular: Normal  rate and regular rhythm.   Respiratory: No respiratory distress.  GI: Soft. He exhibits no distension.  Musculoskeletal: Normal range of motion.  Neurological: He is alert and oriented to person, place, and time.  Skin: Skin is warm and dry.  Psychiatric: He has a normal mood and affect. His behavior is normal. Judgment and thought content normal.    Laboratory Data:  Results for orders placed or performed during the hospital encounter of 01/12/16 (from the past 24 hour(s))  I-STAT, chem 8     Status: Abnormal   Collection Time: 01/12/16 11:39 AM  Result Value Ref Range   Sodium 143 135 - 145 mmol/L   Potassium 3.8 3.5 - 5.1 mmol/L   Chloride 104 101 - 111 mmol/L   BUN 15 6 - 20 mg/dL   Creatinine, Ser 0.90 0.61 - 1.24 mg/dL   Glucose, Bld 102 (H) 65 - 99 mg/dL   Calcium, Ion 1.23 1.13  - 1.30 mmol/L   TCO2 25 0 - 100 mmol/L   Hemoglobin 15.6 13.0 - 17.0 g/dL   HCT 46.0 39.0 - 52.0 %   No results found for this or any previous visit (from the past 240 hour(s)). Creatinine:  Recent Labs  01/12/16 1139  CREATININE 0.90   Baseline Creatinine: 0.9  Impression/Assessment:  74yo with elevated PSA  Plan:  The risks/beenfits/alternatives to prostate biopsy was explained to the patient and he understands and wishes to proceed with surgery  Nicolette Bang 01/12/2016, 1:10 PM

## 2016-01-13 ENCOUNTER — Other Ambulatory Visit: Payer: Medicare Other | Admitting: Family Medicine

## 2016-01-13 ENCOUNTER — Encounter (HOSPITAL_BASED_OUTPATIENT_CLINIC_OR_DEPARTMENT_OTHER): Payer: Self-pay | Admitting: Urology

## 2016-01-19 ENCOUNTER — Other Ambulatory Visit: Payer: Self-pay | Admitting: Urology

## 2016-01-19 DIAGNOSIS — C61 Malignant neoplasm of prostate: Secondary | ICD-10-CM

## 2016-01-26 ENCOUNTER — Ambulatory Visit: Payer: Medicare Other

## 2016-01-26 ENCOUNTER — Ambulatory Visit: Payer: Medicare Other | Admitting: Radiation Oncology

## 2016-01-28 DIAGNOSIS — S8391XA Sprain of unspecified site of right knee, initial encounter: Secondary | ICD-10-CM | POA: Diagnosis not present

## 2016-01-28 DIAGNOSIS — M1711 Unilateral primary osteoarthritis, right knee: Secondary | ICD-10-CM | POA: Diagnosis not present

## 2016-01-29 ENCOUNTER — Encounter (HOSPITAL_COMMUNITY)
Admission: RE | Admit: 2016-01-29 | Discharge: 2016-01-29 | Disposition: A | Discharge status: Home / Self Care | Payer: Medicare Other | Source: Ambulatory Visit | Attending: Urology | Admitting: Urology

## 2016-01-29 DIAGNOSIS — R937 Abnormal findings on diagnostic imaging of other parts of musculoskeletal system: Secondary | ICD-10-CM | POA: Insufficient documentation

## 2016-01-29 DIAGNOSIS — C61 Malignant neoplasm of prostate: Secondary | ICD-10-CM

## 2016-01-29 MED ORDER — TECHNETIUM TC 99M MEDRONATE IV KIT
25.0000 | PACK | Freq: Once | INTRAVENOUS | Status: AC | PRN
  Administered 2016-01-29: 27 via INTRAVENOUS

## 2016-02-05 ENCOUNTER — Ambulatory Visit (HOSPITAL_COMMUNITY)
Admission: RE | Admit: 2016-02-05 | Discharge: 2016-02-05 | Disposition: A | Discharge status: Home / Self Care | Payer: Medicare Other | Source: Ambulatory Visit | Attending: Urology | Admitting: Urology

## 2016-02-05 DIAGNOSIS — C61 Malignant neoplasm of prostate: Secondary | ICD-10-CM

## 2016-02-05 MED ORDER — GADOBENATE DIMEGLUMINE 529 MG/ML IV SOLN
20.0000 mL | Freq: Once | INTRAVENOUS | Status: AC | PRN
  Administered 2016-02-05: 17 mL via INTRAVENOUS

## 2016-02-09 ENCOUNTER — Encounter: Payer: Self-pay | Admitting: Radiation Oncology

## 2016-02-09 ENCOUNTER — Ambulatory Visit
Admission: RE | Admit: 2016-02-09 | Discharge: 2016-02-09 | Disposition: A | Discharge status: Home / Self Care | Payer: Medicare Other | Source: Ambulatory Visit | Attending: Radiation Oncology | Admitting: Radiation Oncology

## 2016-02-09 ENCOUNTER — Encounter: Payer: Self-pay | Admitting: Medical Oncology

## 2016-02-09 VITALS — BP 151/79 | HR 63 | Temp 98.6°F | Resp 18 | Wt 182.0 lb

## 2016-02-09 DIAGNOSIS — C61 Malignant neoplasm of prostate: Secondary | ICD-10-CM | POA: Diagnosis not present

## 2016-02-09 DIAGNOSIS — Z510 Encounter for antineoplastic radiation therapy: Secondary | ICD-10-CM | POA: Insufficient documentation

## 2016-02-09 DIAGNOSIS — Z87891 Personal history of nicotine dependence: Secondary | ICD-10-CM | POA: Diagnosis not present

## 2016-02-09 DIAGNOSIS — Z8572 Personal history of non-Hodgkin lymphomas: Secondary | ICD-10-CM | POA: Diagnosis not present

## 2016-02-09 HISTORY — DX: Malignant neoplasm of prostate: C61

## 2016-02-09 NOTE — Progress Notes (Signed)
Radiation Oncology         ([phone]) [phone] ________________________________  Initial Outpatient Consultation  Name: Eric Brock MRN: [id number]  Date: 02/09/2016  DOB: 1941-08-02  [id number] W, MD  McKenzie, Candee Furbish, MD   REFERRING PHYSICIAN: Cleon Gustin, MD  DIAGNOSIS: 75 y.o. gentleman with stage T2b adenocarcinoma of the prostate with a Gleason's score of 4+3 and a PSA of 4.2    ICD-9-Brock ICD-10-Brock   1. Malignant neoplasm of prostate ([place]) [place] ILLNESS::Eric Brock is a 75 y.o. gentleman.  He was noted to have an elevated PSA of 4.2 by his primary care physician, Dr. Elease Hashimoto.  Accordingly, he was referred for evaluation in urology by Dr. Alyson Ingles on 11/13/2015,  digital rectal examination was performed at that time revealing a palpable nodule involving the entire right lobe.  The patient proceeded to transrectal ultrasound with 12 biopsies of the prostate on 01/12/2016. The prostate volume measured 35 cc.  Out of 12 core biopsies, 12 were positive.  The maximum Gleason score was 4+3, and this was seen in the right base lateral region.  The patient reviewed the biopsy results with his urologist and he has kindly been referred today for discussion of potential radiation treatment options.  PREVIOUS RADIATION THERAPY: Yes, he has a history of lymphoma and had 19 fractions to his forehead over a 3 week period around 1995.  PAST MEDICAL HISTORY:  has a past medical history of Bronchiectasis (pulmologist-  dr Vaughan Browner ([place])); Hyperlipidemia; History of non-Hodgkin's lymphoma (oncologist-  dr Benay Spice--  pt in remission); Allergic rhinitis; History of colon polyps; Elevated PSA; History of concussion; Lower urinary tract symptoms (LUTS); Arthritis; Wears glasses; and Prostate cancer ([place]).    PAST SURGICAL HISTORY: Past Surgical History  Procedure Laterality Date  . Cataract extraction w/ intraocular lens  implant, bilateral  2013  .  Umbilical hernia repair  1992  . Cholecystectomy  1994  . Colonoscopy  last one 07-14-2015  . Prostate biopsy N/A 01/12/2016    Procedure: BIOPSY TRANSRECTAL ULTRASONIC PROSTATE (TUBP);  Surgeon: Cleon Gustin, MD;  Location: [facility];  Service: Urology;  Laterality: N/A;    FAMILY HISTORY: family history includes Brain cancer in his mother; Cancer in his brother; Heart disease in his father. There is no history of Colon cancer, Colon polyps, Esophageal cancer, Rectal cancer, or Stomach cancer.   He denies any breast cancer in his family. He mentions his brother was around 40 when he was diagnosed with prostate cancer.   SOCIAL HISTORY:  reports that he quit smoking about 40 years ago. His smoking use included Cigarettes. He has a 12 pack-year smoking history. He has never used smokeless tobacco. He reports that he drinks about 4.2 oz of alcohol per week. He reports that he does not use illicit drugs.  ALLERGIES: Review of patient's allergies indicates no known allergies.  MEDICATIONS:  Current Outpatient Prescriptions  Medication Sig Dispense Refill  . fexofenadine (ALLEGRA) 180 MG tablet Take 180 mg by mouth every morning.    . GuaiFENesin (MUCINEX PO) Take 1,200 mg by mouth 2 (two) times daily. Extended release    . ibuprofen (ADVIL,MOTRIN) 200 MG tablet Take 200 mg by mouth every 6 (six) hours as needed.     . Multiple Vitamins-Minerals (CENTRUM ADULTS PO) Take 1 tablet by mouth daily.    . Naproxen Sodium (ALEVE) 220 MG CAPS Take by mouth as needed.    Marland Kitchen  tamsulosin (FLOMAX) 0.4 MG CAPS capsule Take 0.4 mg by mouth daily after supper.     . diazepam (VALIUM) 10 MG tablet Reported on 02/09/2016     No current facility-administered medications for this encounter.    REVIEW OF SYSTEMS:  On review of systems, the patient reports that he is doing well overall. He denies any chest pain, shortness of breath, cough, fevers, chills, night sweats, unintended weight  changes. He denies any bowel disturbances, and denies abdominal pain, nausea or vomiting. He denies any new musculoskeletal or joint aches or pains. The patient complains of nocturia x 2 and intermittency. He denies dysuria, hematuria, or incontinence. His IPSS score was 3 indicating mild urinary outflow obstructive symptoms.  He indicated that his erectile function is poor and is unable to complete sexual activity at most attempts. A complete review of systems is obtained and is otherwise negative.   PHYSICAL EXAM: This patient is in no acute distress.  He is alert and oriented.   weight is 182 lb (82.555 kg). His oral temperature is 98.6 F (37 C). His blood pressure is 151/79 and his pulse is 63. His respiration is 18 and oxygen saturation is 98%.  He exhibits no respiratory distress or labored breathing. He appears neurologically intact.  His mood is pleasant.  His affect is appropriate.  Please note the digital rectal exam findings described above.  In general this is a well appearing caucasian male in no acute distress. He's alert and oriented x4 and appropriate throughout the examination. Cardiopulmonary assessment is negative for acute distress and exhibits normal effort.   KPS = 100  100 - Normal; no complaints; no evidence of disease. 90   - Able to carry on normal activity; minor signs or symptoms of disease. 80   - Normal activity with effort; some signs or symptoms of disease. 65   - Cares for self; unable to carry on normal activity or to do active work. 60   - Requires occasional assistance, but is able to care for most of his personal needs. 50   - Requires considerable assistance and frequent medical care. 33   - Disabled; requires special care and assistance. 50   - Severely disabled; hospital admission is indicated although death not imminent. 4   - Very sick; hospital admission necessary; active supportive treatment necessary. 10   - Moribund; fatal processes progressing  rapidly. 0     - Dead  Karnofsky DA, Abelmann [place], Craver LS and Burchenal [facility] [phone]) The use of the nitrogen mustards in the palliative treatment of carcinoma: with particular reference to bronchogenic carcinoma Cancer 1 [id number]   LABORATORY DATA:  Lab Results  Component Value Date   WBC 9.1 03/17/2015   HGB 15.6 01/12/2016   HCT 46.0 01/12/2016   MCV 95.1 03/17/2015   PLT 237.0 03/17/2015   Lab Results  Component Value Date   NA 143 01/12/2016   K 3.8 01/12/2016   CL 104 01/12/2016   CO2 28 07/23/2015   Lab Results  Component Value Date   ALT 25 03/17/2015   AST 19 03/17/2015   ALKPHOS 69 03/17/2015   BILITOT 0.7 03/17/2015     RADIOGRAPHY: Nm Bone Scan Whole Body  01/29/2016  CLINICAL DATA:  Prostate cancer. EXAM: NUCLEAR MEDICINE WHOLE BODY BONE SCAN TECHNIQUE: Whole body anterior and posterior images were obtained approximately 3 hours after intravenous injection of radiopharmaceutical. RADIOPHARMACEUTICALS:  27.0 mCi Technetium-21m MDP IV COMPARISON: COMPARISON CT 07/20/2013. FINDINGS: Bilateral renal function  and excretion. Increased activity noted about the right knee most likely degenerative. Right knee series can be obtained for further evaluation. No evidence of metastatic disease. IMPRESSION: Increased activity noted about the right knee, most likely degenerative. Right knee series can be obtained for further evaluation. Exam otherwise unremarkable. No evidence of metastatic disease. Electronically Signed   By: Marcello Moores  Register   On: 01/29/2016 12:04   [place] Transrectal Complete  01/12/2016  CLINICAL DATA:  Prostate carcinoma EXAM: US?BIOPSY?PROSTATE?MULTIPLE; IR ULTRASOUND GUIDED ASPIRATION/DRAINAGE; TRANSRECTAL ULTRASOUND FINDINGS: Ultrasound guidance was provided for prostate biopsy. No radiologist in attendance. IMPRESSION: Ultrasound guidance for prostate biopsy Electronically Signed   By: Lucrezia Europe M.D.   On: 01/12/2016 15:08   US Guided Needle Placement  01/12/2016   CLINICAL DATA:  Prostate carcinoma EXAM: US?BIOPSY?PROSTATE?MULTIPLE; IR ULTRASOUND GUIDED ASPIRATION/DRAINAGE; TRANSRECTAL ULTRASOUND FINDINGS: Ultrasound guidance was provided for prostate biopsy. No radiologist in attendance. IMPRESSION: Ultrasound guidance for prostate biopsy Electronically Signed   By: Lucrezia Europe M.D.   On: 01/12/2016 15:08   [place] Intraoperative  01/12/2016  CLINICAL DATA: Prostate CA (W[place]) Ultrasound was provided for use by the ordering physician, and a technical charge was applied by the performing facility.  No radiologist interpretation/professional services rendered.   Eric Brock  02/05/2016  CLINICAL DATA:  Biopsy-proven prostate cancer.  Elevated PSA. EXAM: Eric PROSTATE WITHOUT AND WITH CONTRAST TECHNIQUE: Multiplanar multisequence MRI images were obtained of the pelvis centered about the prostate. Pre and post contrast images were obtained. CONTRAST:  72mL MULTIHANCE GADOBENATE DIMEGLUMINE 529 MG/ML IV SOLN COMPARISON:  None. FINDINGS: Prostate: Focus of restricted diffusion within the LEFT apex measures 14 mm by 9 mm (image 10, series 1200). 12 mm lesion with low signal intensity on T2 weighted imaging is also seen at this location. Low signal intensity on T2 imaging extends to the LEFT lateral mid gland in a thin subcapsular band (image number 10, series 9). Region of low signal intensity at the RIGHT lateral mid gland measures approximately 9 mm by 9 mm on diffusion weighted series 1200. The prostatic capsule appears intact. No gross evidence of transscapular spread. The most concerning area for potential transscapular spread would be posteriorly at the LEFT apex with there is difficult to define a tissue plane between the anterior rectum and LEFT apex (image 20, series 6). Seminal vesicles are normal.  Neurovascular bundles are intact. The central gland is nodular with well capsulated nodules. Transcapsular spread:  Absent, see description above Seminal vesicle  involvement: Absent Neurovascular bundle involvement: Absent Pelvic adenopathy: Absent Bone metastasis: Absent Other findings: Prostate volume equals 4.2 x 5.4 by 5.0 Brock (57 cubic Brock). IMPRESSION: 1. High-grade carcinoma involving the LEFT apex and LEFT lateral mid gland. 2. High-grade carcinoma involving the RIGHT lateral mid gland. 3. No clear evidence of extracapsular extension. 4. Normal seminal vesicles. 5. No lymphadenopathy or skeletal metastasis. Electronically Signed   By: Suzy Bouchard M.D.   On: 02/05/2016 12:36   [place] Prostate Biopsy Multiple  01/12/2016  CLINICAL DATA:  Prostate carcinoma EXAM: US?BIOPSY?PROSTATE?MULTIPLE; IR ULTRASOUND GUIDED ASPIRATION/DRAINAGE; TRANSRECTAL ULTRASOUND FINDINGS: Ultrasound guidance was provided for prostate biopsy. No radiologist in attendance. IMPRESSION: Ultrasound guidance for prostate biopsy Electronically Signed   By: Lucrezia Europe M.D.   On: 01/12/2016 15:08    IMPRESSION: This gentleman is a 75 yo with Stage T2b adenocarcinoma of the prostate with a Gleason score of 4+3 and a PSA of 4.2.  His T-Stage, Gleason's Score, and PSA put him  into the intermediate risk group.  Accordingly he is eligible for a variety of potential treatment options including surgery, external radiation with ADT, or external radiation plus seed boost.   PLAN: Today I reviewed the findings and workup thus far.  We discussed the natural history of prostate cancer.  We reviewed the the implications of T-stage, Gleason's Score, and PSA on decision-making and outcomes in prostate cancer.  We discussed radiation treatment in the management of prostate cancer with regard to the logistics and delivery of external beam radiation treatment as well as the logistics and delivery of prostate brachytherapy.  We compared and contrasted each of these approaches and also compared these against prostatectomy. I filled out a patient counseling form for him with relevant treatment diagrams and we  retained a copy for our records. He is deciding between a prostatectomy and external radiation with seed implant boost. He will meet with Dr. Alinda Money to discuss surgery options.  He would like a second opinion on his biopsy pathology. I recommend he talks with a surgeon before getting a second opinion. He follows up with Dr. Alyson Ingles in August. I will refer him to Dr. Alinda Money also for consideration of surgery.   I spent 40 minutes face to face with the patient and more than 50% of that time was spent in counseling and/or coordination of care.   ------------------------------------------------  Sheral Apley. Tammi Klippel, M.D.    This document serves as a record of services personally performed by Tyler Pita, MD. It was created on his behalf by Lendon Collar, a trained medical scribe. The creation of this record is based on the scribe's personal observations and the provider's statements to them. This document has been checked and approved by the attending provider.

## 2016-02-09 NOTE — Progress Notes (Signed)
See progress note under physician encounter. 

## 2016-02-09 NOTE — Progress Notes (Signed)
GU Location of Tumor / Histology: prostatic adenocarcinoma  If Prostate Cancer, Gleason Score is (4 + 3) and PSA is (4.20) on 09/23/15  Eric Brock was referred by PCP, Dr. Elease Hashimoto, to Dr. Alyson Ingles for evaluation of an elevated PSA.  Biopsies of prostate (if applicable) revealed:  1. Prostate, needle biopsy(ies), right base medial PROSTATIC ADENOCARCINOMA, GLEASON SCORE 3+4=7 INVOLVES 40% OF ONE CORE (PATTERN 4= 20%). PERINEURAL INVASION IDENTIFIED 2. Prostate, needle biopsy(ies), right base lateral PROSTATIC ADENOCARCINOMA, GLEASON SCORE 4+3=7 (GRADE GROUP 3) INVOLVES 100% OF ONE CORE (PATTERN 4= 90%). PERINEURAL INVASION IDENTIFIED 3. Prostate, needle biopsy(ies), right mid medial PROSTATIC ADENOCARCINOMA, GLEASON SCORE 3+3=6 INVOLVES 20% OF ONE CORE 4. Prostate, needle biopsy(ies), right mid lateral PROSTATIC ADENOCARCINOMA, GLEASON SCORE 3+3=6 INVOLVES 50% OF ONE CORE 5. Prostate, needle biopsy(ies), right apex lateral PROSTATIC ADENOCARCINOMA, GLEASON SCORE 3+4=7 INVOLVES 100% OF ONE CORE (PATTERN 4= 20%) 6. Prostate, needle biopsy(ies), right apex medial PROSTATIC ADENOCARCINOMA, GLEASON SCORE 3+4=7 INVOLVES 80% OF ONE CORE (PATTERN 4= 10%) 7. Prostate, needle biopsy(ies), left base medial PROSTATIC ADENOCARCINOMA, GLEASON SCORE 3+3=6 DISCONTINUOUSLY INVOLVES 40% OF ONE CORE 8. Prostate, needle biopsy(ies), left base lateral PROSTATIC ADENOCARCINOMA, GLEASON SCORE 3+4=7 INVOLVES 10% OF ONE CORE (PATTERN 4= 30%). PERINEURAL INVASION IDENTIFIED 9. Prostate, needle biopsy(ies), left mid medial PROSTATIC ADENOCARCINOMA, GLEASON SCORE 3+3=6 INVOLVES 20% OF ONE CORE 10. Prostate, needle biopsy(ies), left mid lateral PROSTATIC ADENOCARCINOMA, GLEASON SCORE 3+4=7 INVOLVES 90% OF ONE CORE (PATTERN 4= 10%) 11. Prostate, needle biopsy(ies), left apex medial PROSTATIC ADENOCARCINOMA, GLEASON SCORE 3+4=7 INVOLVES 60% OF ONE CORE (PATTERN 4= 10%). PERINEURAL INVASION IDENTIFIED 12.  Prostate, needle biopsy(ies), left apex lateral PROSTATIC ADENOCARCINOMA, GLEASON SCORE 3+3=6 INVOLVES 80% OF ONE CORE  Past/Anticipated interventions by urology, if any: biopsy and referral to radiation oncology  Past/Anticipated interventions by medical oncology, if any: no  Weight changes, if any: no  Bowel/Bladder complaints, if any: Reports frequency, hesitancy and nocturia x 2. Denies dysuria, hematuria, incontinence or leakage. IPSS 3. Reports ED is a major issue for him.  Nausea/Vomiting, if any: no  Pain issues, if any:  no  SAFETY ISSUES:  Prior radiation? Yes; lymphoma. 19 radiation treatment to forehead.  Pacemaker/ICD? no  Possible current pregnancy? no  Is the patient on methotrexate? no  Current Complaints / other details:  75 year old male.

## 2016-02-09 NOTE — Progress Notes (Signed)
Oncology Nurse Navigator Documentation  Oncology Nurse Navigator Flowsheets 02/09/2016  Navigator Location [facility]  Abnormal Finding Date 11/13/2015  Confirmed Diagnosis Date 01/12/2016  Patient Visit Type RadOnc- Mr. Beth referred to Dr. Tammi Klippel to discuss radiation. He was also interested in an appointment with Dr. Alinda Money. He was referred to the Prostate [facility]. I spoke with Mr. Stendahl and his wife and they are interested in attending the clinic 7/14.   Barriers/Navigation Needs Coordination of Care  Interventions Coordination of Care  Coordination of Care Appts  Support Groups/Services Friends and Family  Acuity Level 2  Acuity Level 2 Initial guidance, education and coordination as needed;Assistance expediting appointments  Time Spent with Patient 30

## 2016-02-10 DIAGNOSIS — M25561 Pain in right knee: Secondary | ICD-10-CM | POA: Diagnosis not present

## 2016-02-10 NOTE — Addendum Note (Signed)
Encounter addended by: Heywood Footman, RN on: 02/10/2016  9:47 AM<BR>     Documentation filed: Flowsheet VN, BPA Follow-up Actions

## 2016-02-13 ENCOUNTER — Encounter: Payer: Self-pay | Admitting: Medical Oncology

## 2016-02-13 ENCOUNTER — Ambulatory Visit (HOSPITAL_BASED_OUTPATIENT_CLINIC_OR_DEPARTMENT_OTHER): Payer: Medicare Other | Admitting: Oncology

## 2016-02-13 ENCOUNTER — Ambulatory Visit
Admission: RE | Admit: 2016-02-13 | Discharge: 2016-02-13 | Disposition: A | Discharge status: Home / Self Care | Payer: Medicare Other | Source: Ambulatory Visit | Attending: Radiation Oncology | Admitting: Radiation Oncology

## 2016-02-13 VITALS — BP 144/77 | HR 58 | Resp 16 | Wt 180.2 lb

## 2016-02-13 DIAGNOSIS — Z8579 Personal history of other malignant neoplasms of lymphoid, hematopoietic and related tissues: Secondary | ICD-10-CM | POA: Diagnosis not present

## 2016-02-13 DIAGNOSIS — E785 Hyperlipidemia, unspecified: Secondary | ICD-10-CM | POA: Insufficient documentation

## 2016-02-13 DIAGNOSIS — C61 Malignant neoplasm of prostate: Secondary | ICD-10-CM

## 2016-02-13 DIAGNOSIS — Z86010 Personal history of colonic polyps: Secondary | ICD-10-CM | POA: Insufficient documentation

## 2016-02-13 DIAGNOSIS — Z8572 Personal history of non-Hodgkin lymphomas: Secondary | ICD-10-CM | POA: Diagnosis not present

## 2016-02-13 DIAGNOSIS — Z87891 Personal history of nicotine dependence: Secondary | ICD-10-CM | POA: Diagnosis not present

## 2016-02-13 NOTE — Progress Notes (Signed)
Reason for Referral: Prostate cancer.   HPI: 75 year old gentleman native of [place] but lived in multiple locations. He is currently living in [place] for last few years. The gentleman with history of hyperlipidemia as well as history of cutaneous lymphoma and was treated with chemotherapy and radiation. He was found to have an elevated PSA of 4.2 and it was evaluated by Dr. Alyson Ingles in April 2017. Digital rectal examination showed a involvement of a palpable nodule involving the entire lobe. On 01/12/2016 he underwent a biopsy of the prostate which showed involvement of 12 of 12 cores with a Gleason score 4+3 = 7. Bone scan on 01/29/2016 showed no evidence of metastatic disease. Medically, he has very little complaints. He does have weak urinary stream as well as nocturia. He denied any hematuria. He denied any constitutional symptoms of back pain or bone pain.  He does not report any headaches blurry vision, syncope or seizures. He does not report any fevers or chills or sweats. He does not report any cough or hemoptysis or hematemesis. As that report any nausea, vomiting or abdominal pain. Does not report any frequency urgency or hesitancy. Has not reported any skeletal complaints. Review of systems unremarkable.   Past Medical History  Diagnosis Date  . Bronchiectasis pulmologist-  dr Vaughan Browner ([place])    intermittant--  per pt last bout yr ago 2016  . Hyperlipidemia     diet controlled, no med  . History of non-Hodgkin's lymphoma oncologist-  dr Benay Spice--  pt in remission    dx 03/ 2000  ,  Stage IA,  scalp/forehead ,  post chemo/radioation therapy  . Allergic rhinitis   . History of colon polyps     1999  . Elevated PSA   . History of concussion     2005 and 2010 with mild cerebral bleed resolved -no residual  . Lower urinary tract symptoms (LUTS)   . Arthritis   . Wears glasses   . Prostate cancer [facility])   :  Past Surgical History  Procedure Laterality Date  . Cataract  extraction w/ intraocular lens  implant, bilateral  2013  . Umbilical hernia repair  1992  . Cholecystectomy  1994  . Colonoscopy  last one 07-14-2015  . Prostate biopsy N/A 01/12/2016    Procedure: BIOPSY TRANSRECTAL ULTRASONIC PROSTATE (TUBP);  Surgeon: Cleon Gustin, MD;  Location: [facility];  Service: Urology;  Laterality: N/A;  :   Current outpatient prescriptions:  .  diazepam (VALIUM) 10 MG tablet, Reported on 02/09/2016, Disp: , Rfl:  .  fexofenadine (ALLEGRA) 180 MG tablet, Take 180 mg by mouth every morning., Disp: , Rfl:  .  GuaiFENesin (MUCINEX PO), Take 1,200 mg by mouth 2 (two) times daily. Extended release, Disp: , Rfl:  .  ibuprofen (ADVIL,MOTRIN) 200 MG tablet, Take 200 mg by mouth every 6 (six) hours as needed. , Disp: , Rfl:  .  Multiple Vitamins-Minerals (CENTRUM ADULTS PO), Take 1 tablet by mouth daily., Disp: , Rfl:  .  Naproxen Sodium (ALEVE) 220 MG CAPS, Take by mouth as needed., Disp: , Rfl:  .  tamsulosin (FLOMAX) 0.4 MG CAPS capsule, Take 0.4 mg by mouth daily after supper. , Disp: , Rfl: :  No Known Allergies:  Family History  Problem Relation Age of Onset  . Brain cancer Mother   . Heart disease Father   . Cancer Brother     prostate  . Colon cancer Neg Hx   . Colon polyps Neg  Hx   . Esophageal cancer Neg Hx   . Rectal cancer Neg Hx   . Stomach cancer Neg Hx   :  Social History   Social History  . Marital Status: Married    Spouse Name: N/A  . Number of Children: N/A  . Years of Education: N/A   Occupational History  . retired    Social History Main Topics  . Smoking status: Former Smoker -- 1.00 packs/day for 12 years    Types: Cigarettes    Quit date: 08/03/1975  . Smokeless tobacco: Never Used  . Alcohol Use: 4.2 oz/week    7 Glasses of wine per week     Comment: 1 glass of wine daily  . Drug Use: No  . Sexual Activity: Yes   Other Topics Concern  . Not on file   Social History Narrative  :  Pertinent  items are noted in HPI.  Exam: There were no vitals taken for this visit. General appearance: alert and cooperative Head: Normocephalic, without obvious abnormality Throat: lips, mucosa, and tongue normal; teeth and gums normal Neck: no adenopathy Back: negative Resp: clear to auscultation bilaterally Chest wall: no tenderness Cardio: regular rate and rhythm, S1, S2 normal, no murmur, click, rub or gallop GI: soft, non-tender; bowel sounds normal; no masses,  no organomegaly Extremities: extremities normal, atraumatic, no cyanosis or edema Pulses: 2+ and symmetric Skin: Skin color, texture, turgor normal. No rashes or lesions    Nm Bone Scan Whole Body  01/29/2016  CLINICAL DATA:  Prostate cancer. EXAM: NUCLEAR MEDICINE WHOLE BODY BONE SCAN TECHNIQUE: Whole body anterior and posterior images were obtained approximately 3 hours after intravenous injection of radiopharmaceutical. RADIOPHARMACEUTICALS:  27.0 mCi Technetium-27m MDP IV COMPARISON: COMPARISON CT 07/20/2013. FINDINGS: Bilateral renal function and excretion. Increased activity noted about the right knee most likely degenerative. Right knee series can be obtained for further evaluation. No evidence of metastatic disease. IMPRESSION: Increased activity noted about the right knee, most likely degenerative. Right knee series can be obtained for further evaluation. Exam otherwise unremarkable. No evidence of metastatic disease. Electronically Signed   By: Marcello Moores  Register   On: 01/29/2016 12:04   Mr Prostate W / Wo Cm  02/05/2016  CLINICAL DATA:  Biopsy-proven prostate cancer.  Elevated PSA. EXAM: MR PROSTATE WITHOUT AND WITH CONTRAST TECHNIQUE: Multiplanar multisequence MRI images were obtained of the pelvis centered about the prostate. Pre and post contrast images were obtained. CONTRAST:  108mL MULTIHANCE GADOBENATE DIMEGLUMINE 529 MG/ML IV SOLN COMPARISON:  None. FINDINGS: Prostate: Focus of restricted diffusion within the LEFT apex  measures 14 mm by 9 mm (image 10, series 1200). 12 mm lesion with low signal intensity on T2 weighted imaging is also seen at this location. Low signal intensity on T2 imaging extends to the LEFT lateral mid gland in a thin subcapsular band (image number 10, series 9). Region of low signal intensity at the RIGHT lateral mid gland measures approximately 9 mm by 9 mm on diffusion weighted series 1200. The prostatic capsule appears intact. No gross evidence of transscapular spread. The most concerning area for potential transscapular spread would be posteriorly at the LEFT apex with there is difficult to define a tissue plane between the anterior rectum and LEFT apex (image 20, series 6). Seminal vesicles are normal.  Neurovascular bundles are intact. The central gland is nodular with well capsulated nodules. Transcapsular spread:  Absent, see description above Seminal vesicle involvement: Absent Neurovascular bundle involvement: Absent Pelvic adenopathy: Absent Bone metastasis:  Absent Other findings: Prostate volume equals 4.2 x 5.4 by 5.0 cm (57 cubic cm). IMPRESSION: 1. High-grade carcinoma involving the LEFT apex and LEFT lateral mid gland. 2. High-grade carcinoma involving the RIGHT lateral mid gland. 3. No clear evidence of extracapsular extension. 4. Normal seminal vesicles. 5. No lymphadenopathy or skeletal metastasis. Electronically Signed   By: Suzy Bouchard M.D.   On: 02/05/2016 12:36    Assessment and Plan:    75 year old gentleman with prostate cancer diagnosed in June 2017. He has a Gleason score 3+4 = 7 high-volume disease with 12 cores involved. His PSA is 4.2. His staging workup did not show any evidence of metastasis at this time.  His case was discussed today the prostate cancer multidisciplinary clinic with review of his imaging studies as well as pathology. The natural course of this disease was reviewed with the patient as well as options of therapy. His options would include surgical  therapy versus radiation therapy with androgen deprivation. He would be a good candidate for hormone therapy with short-term androgen depravation. Constipation associated with this therapy was reviewed especially the implication of systemic symptoms. Additional systemic therapy is not warranted at this time but certainly if he develops advanced disease.  He appears to be in agreement to proceed with radiation therapy associated with androgen deprivation and likely will commence in the near future.

## 2016-02-13 NOTE — Progress Notes (Signed)
Radiation Oncology         [phone] ________________________________ Follow Up  Name: Eric Brock MRN: [id number]  Date: 02/13/2016  DOB: 08/18/1940  [id number] W, MD  McKenzie, Candee Furbish, MD   REFERRING PHYSICIAN: Cleon Gustin, MD  DIAGNOSIS: 75 y.o. gentleman with stage T2b adenocarcinoma of the prostate with a Gleason's score of 4+3 and a PSA of 4.2    ICD-9-CM ICD-10-CM   1. Malignant neoplasm of prostate (Eric Brock) [place] ILLNESS::Eric Brock is a 75 y.o. gentleman.  He was noted to have an elevated PSA of 4.2 by his primary care physician, Dr. Elease Brock.  Accordingly, he was referred for evaluation in urology by Dr. Alyson Brock on 11/13/2015,  digital rectal examination was performed at that time revealing a palpable nodule involving the entire right lobe.  The patient proceeded to transrectal ultrasound with 12 biopsies of the prostate on 01/12/2016. The prostate volume measured 35 cc.  Out of 12 core biopsies, 12 were positive.  The maximum Gleason score was 4+3, and this was seen in the right base lateral region.  The patient reviewed the biopsy results with his urologist and he has kindly been referred today for discussion of potential radiation treatment options.  PREVIOUS RADIATION THERAPY: Yes, he has a history of lymphoma and had 19 fractions to his forehead over a 3 week period around 1995.  PAST MEDICAL HISTORY:  has a past medical history of Bronchiectasis (pulmologist-  dr Eric Brock ([place])); Hyperlipidemia; History of non-Hodgkin's lymphoma (oncologist-  dr Eric Brock--  pt in remission); Allergic rhinitis; History of colon polyps; Elevated PSA; History of concussion; Lower urinary tract symptoms (LUTS); Arthritis; Wears glasses; and Prostate cancer (Eric Brock).    PAST SURGICAL HISTORY: Past Surgical History  Procedure Laterality Date  . Cataract extraction w/ intraocular lens  implant, bilateral  2013  . Umbilical hernia repair   1992  . Cholecystectomy  1994  . Colonoscopy  last one 07-14-2015  . Prostate biopsy N/A 01/12/2016    Procedure: BIOPSY TRANSRECTAL ULTRASONIC PROSTATE (TUBP);  Surgeon: Eric Gustin, MD;  Location: [facility];  Service: Urology;  Laterality: N/A;    FAMILY HISTORY: family history includes Brain cancer in his mother; Cancer in his brother; Heart disease in his father. There is no history of Colon cancer, Colon polyps, Esophageal cancer, Rectal cancer, or Stomach cancer.   He denies any breast cancer in his family. He mentions his brother was around 73 when he was diagnosed with prostate cancer.   SOCIAL HISTORY:  reports that he quit smoking about 40 years ago. His smoking use included Cigarettes. He has a 12 pack-year smoking history. He has never used smokeless tobacco. He reports that he drinks about 4.2 oz of alcohol per week. He reports that he does not use illicit drugs.  ALLERGIES: Review of patient's allergies indicates no known allergies.  MEDICATIONS:  Current Outpatient Prescriptions  Medication Sig Dispense Refill  . diazepam (VALIUM) 10 MG tablet Reported on 02/09/2016    . fexofenadine (ALLEGRA) 180 MG tablet Take 180 mg by mouth every morning.    . GuaiFENesin (MUCINEX PO) Take 1,200 mg by mouth 2 (two) times daily. Extended release    . ibuprofen (ADVIL,MOTRIN) 200 MG tablet Take 200 mg by mouth every 6 (six) hours as needed.     . Multiple Vitamins-Minerals (CENTRUM ADULTS PO) Take 1 tablet by mouth daily.    . Naproxen Sodium (ALEVE) 220 MG  CAPS Take by mouth as needed.    . tamsulosin (FLOMAX) 0.4 MG CAPS capsule Take 0.4 mg by mouth daily after supper.      No current facility-administered medications for this encounter.    REVIEW OF SYSTEMS:  On review of systems, the patient reports that he is doing well overall. He denies any chest pain, shortness of breath, cough, fevers, chills, night sweats, unintended weight changes. He denies any bowel  disturbances, and denies abdominal pain, nausea or vomiting. He denies any new musculoskeletal or joint aches or pains. The patient complains of nocturia x 2 and intermittency. He denies dysuria, hematuria, or incontinence. His IPSS score was 3 indicating mild urinary outflow obstructive symptoms.  He indicated that his erectile function is poor and is unable to complete sexual activity at most attempts. A complete review of systems is obtained and is otherwise negative.   PHYSICAL EXAM: This patient is in no acute distress.  He is alert and oriented.   vitals were not taken for this visit. He exhibits no respiratory distress or labored breathing. He appears neurologically intact.  His mood is pleasant.  His affect is appropriate.  Please note the digital rectal exam findings described above.  In general this is a well appearing caucasian male in no acute distress. He's alert and oriented x4 and appropriate throughout the examination. Cardiopulmonary assessment is negative for acute distress and exhibits normal effort.   KPS = 100  100 - Normal; no complaints; no evidence of disease. 90   - Able to carry on normal activity; minor signs or symptoms of disease. 80   - Normal activity with effort; some signs or symptoms of disease. 26   - Cares for self; unable to carry on normal activity or to do active work. 60   - Requires occasional assistance, but is able to care for most of his personal needs. 50   - Requires considerable assistance and frequent medical care. 75   - Disabled; requires special care and assistance. 74   - Severely disabled; hospital admission is indicated although death not imminent. 71   - Very sick; hospital admission necessary; active supportive treatment necessary. 10   - Moribund; fatal processes progressing rapidly. 0     - Dead  Karnofsky DA, Abelmann [place], Craver LS and Burchenal [facility] [phone]) The use of the nitrogen mustards in the palliative treatment of carcinoma: with  particular reference to bronchogenic carcinoma Cancer 1 634-56   LABORATORY DATA:  Lab Results  Component Value Date   WBC 9.1 03/17/2015   HGB 15.6 01/12/2016   HCT 46.0 01/12/2016   MCV 95.1 03/17/2015   PLT 237.0 03/17/2015   Lab Results  Component Value Date   NA 143 01/12/2016   K 3.8 01/12/2016   CL 104 01/12/2016   CO2 28 07/23/2015   Lab Results  Component Value Date   ALT 25 03/17/2015   AST 19 03/17/2015   ALKPHOS 69 03/17/2015   BILITOT 0.7 03/17/2015     RADIOGRAPHY: Nm Bone Scan Whole Body  01/29/2016  CLINICAL DATA:  Prostate cancer. EXAM: NUCLEAR MEDICINE WHOLE BODY BONE SCAN TECHNIQUE: Whole body anterior and posterior images were obtained approximately 3 hours after intravenous injection of radiopharmaceutical. RADIOPHARMACEUTICALS:  27.0 mCi Technetium-62m MDP IV COMPARISON: COMPARISON CT 07/20/2013. FINDINGS: Bilateral renal function and excretion. Increased activity noted about the right knee most likely degenerative. Right knee series can be obtained for further evaluation. No evidence of metastatic disease. IMPRESSION: Increased activity  noted about the right knee, most likely degenerative. Right knee series can be obtained for further evaluation. Exam otherwise unremarkable. No evidence of metastatic disease. Electronically Signed   By: Marcello Moores  Register   On: 01/29/2016 12:04   Mr Prostate W / Wo Cm  02/05/2016  CLINICAL DATA:  Biopsy-proven prostate cancer.  Elevated PSA. EXAM: MR PROSTATE WITHOUT AND WITH CONTRAST TECHNIQUE: Multiplanar multisequence MRI images were obtained of the pelvis centered about the prostate. Pre and post contrast images were obtained. CONTRAST:  66mL MULTIHANCE GADOBENATE DIMEGLUMINE 529 MG/ML IV SOLN COMPARISON:  None. FINDINGS: Prostate: Focus of restricted diffusion within the LEFT apex measures 14 mm by 9 mm (image 10, series 1200). 12 mm lesion with low signal intensity on T2 weighted imaging is also seen at this location. Low  signal intensity on T2 imaging extends to the LEFT lateral mid gland in a thin subcapsular band (image number 10, series 9). Region of low signal intensity at the RIGHT lateral mid gland measures approximately 9 mm by 9 mm on diffusion weighted series 1200. The prostatic capsule appears intact. No gross evidence of transscapular spread. The most concerning area for potential transscapular spread would be posteriorly at the LEFT apex with there is difficult to define a tissue plane between the anterior rectum and LEFT apex (image 20, series 6). Seminal vesicles are normal.  Neurovascular bundles are intact. The central gland is nodular with well capsulated nodules. Transcapsular spread:  Absent, see description above Seminal vesicle involvement: Absent Neurovascular bundle involvement: Absent Pelvic adenopathy: Absent Bone metastasis: Absent Other findings: Prostate volume equals 4.2 x 5.4 by 5.0 cm (57 cubic cm). IMPRESSION: 1. High-grade carcinoma involving the LEFT apex and LEFT lateral mid gland. 2. High-grade carcinoma involving the RIGHT lateral mid gland. 3. No clear evidence of extracapsular extension. 4. Normal seminal vesicles. 5. No lymphadenopathy or skeletal metastasis. Electronically Signed   By: Suzy Bouchard M.D.   On: 02/05/2016 12:36    IMPRESSION: This gentleman is a 75 yo with Stage T2b adenocarcinoma of the prostate with a Gleason score of 4+3 and a PSA of 4.2.  His T-Stage, Gleason's Score, and PSA put him into the intermediate risk group.  Accordingly he is eligible for a variety of potential treatment options including surgery, external radiation with ADT, or external radiation plus seed boost.   PLAN: Today I reviewed the findings and workup thus far.  We discussed the natural history of prostate cancer.  We reviewed the the implications of T-stage, Gleason's Score, and PSA on decision-making and outcomes in prostate cancer.  We discussed radiation treatment in the management of  prostate cancer with regard to the logistics and delivery of external beam radiation treatment as well as the logistics and delivery of prostate brachytherapy.  We compared and contrasted each of these approaches and also compared these against prostatectomy. I filled out a patient counseling form for him with relevant treatment diagrams and we retained a copy for our records. He is deciding between a prostatectomy and external radiation with seed implant boost. He will meet with Dr. Alinda Money to discuss surgery options.  He would like a second opinion on his biopsy pathology. I recommend he talks with a surgeon before getting a second opinion. He follows up with Dr. Alyson Brock in August. I will refer him to Dr. Alinda Money also for consideration of surgery.

## 2016-02-13 NOTE — Progress Notes (Signed)
Radiation Oncology         [phone] ________________________________ Follow Up  Name: Eric Brock MRN: [id number]  Date: 02/13/2016  DOB: [birth year]  [id number] W, MD  McKenzie, Candee Furbish, MD   REFERRING PHYSICIAN: Cleon Gustin, MD  DIAGNOSIS: 75 y.o. gentleman with stage T2b adenocarcinoma of the prostate with a Gleason's score of 4+3 and a PSA of 4.2    ICD-9-CM ICD-10-CM   1. Malignant neoplasm of prostate ([place]) [place] ILLNESS::Eric Brock is a 75 y.o. gentleman.  He was noted to have an elevated PSA of 4.2 by his primary care physician, Dr. Elease Hashimoto.  Accordingly, he was referred for evaluation in urology by Dr. Alyson Ingles on 11/13/2015,  digital rectal examination was performed at that time revealing a palpable nodule involving the entire right lobe.  The patient proceeded to transrectal ultrasound with 12 biopsies of the prostate on 01/12/2016. The prostate volume measured 35 cc.  Out of 12 core biopsies, 12 were positive.  The maximum Gleason score was 4+3, and this was seen in the right base lateral region.  The patient reviewed the biopsy results with his urologist and he has kindly been referred today for discussion of potential radiation treatment options.  PREVIOUS RADIATION THERAPY: Yes, he has a history of lymphoma and had 19 fractions to his forehead over a 3 week period around 1995.  PAST MEDICAL HISTORY:  has a past medical history of Bronchiectasis (pulmologist-  dr Vaughan Browner ()); Hyperlipidemia; History of non-Hodgkin's lymphoma (oncologist-  dr Benay Spice--  pt in remission); Allergic rhinitis; History of colon polyps; Elevated PSA; History of concussion; Lower urinary tract symptoms (LUTS); Arthritis; Wears glasses; and Prostate cancer ([place]).    PAST SURGICAL HISTORY: Past Surgical History  Procedure Laterality Date  . Cataract extraction w/ intraocular lens  implant, bilateral  2013  . Umbilical hernia repair   1992  . Cholecystectomy  1994  . Colonoscopy  last one 07-14-2015  . Prostate biopsy N/A 01/12/2016    Procedure: BIOPSY TRANSRECTAL ULTRASONIC PROSTATE (TUBP);  Surgeon: Cleon Gustin, MD;  Location: [facility];  Service: Urology;  Laterality: N/A;    FAMILY HISTORY: family history includes Brain cancer in his mother; Cancer in his brother; Heart disease in his father. There is no history of Colon cancer, Colon polyps, Esophageal cancer, Rectal cancer, or Stomach cancer.   He denies any breast cancer in his family. He mentions his brother was around 11 when he was diagnosed with prostate cancer.   SOCIAL HISTORY:  reports that he quit smoking about 40 years ago. His smoking use included Cigarettes. He has a 12 pack-year smoking history. He has never used smokeless tobacco. He reports that he drinks about 4.2 oz of alcohol per week. He reports that he does not use illicit drugs.  ALLERGIES: Review of patient's allergies indicates no known allergies.  MEDICATIONS:  Current Outpatient Prescriptions  Medication Sig Dispense Refill  . diazepam (VALIUM) 10 MG tablet Reported on 02/09/2016    . fexofenadine (ALLEGRA) 180 MG tablet Take 180 mg by mouth every morning.    . GuaiFENesin (MUCINEX PO) Take 1,200 mg by mouth 2 (two) times daily. Extended release    . ibuprofen (ADVIL,MOTRIN) 200 MG tablet Take 200 mg by mouth every 6 (six) hours as needed.     . Multiple Vitamins-Minerals (CENTRUM ADULTS PO) Take 1 tablet by mouth daily.    . Naproxen Sodium (ALEVE) 220 MG  CAPS Take by mouth as needed.    . tamsulosin (FLOMAX) 0.4 MG CAPS capsule Take 0.4 mg by mouth daily after supper.      No current facility-administered medications for this encounter.    REVIEW OF SYSTEMS:  On review of systems, the patient reports that he is doing well overall. He denies any chest pain, shortness of breath, cough, fevers, chills, night sweats, unintended weight changes. He denies any bowel  disturbances, and denies abdominal pain, nausea or vomiting. He denies any new musculoskeletal or joint aches or pains. The patient complains of nocturia x 2 and intermittency. He denies dysuria, hematuria, or incontinence. His IPSS score was 3 indicating mild urinary outflow obstructive symptoms.  He indicated that his erectile function is poor and is unable to complete sexual activity at most attempts. A complete review of systems is obtained and is otherwise negative.   PHYSICAL EXAM: This patient is in no acute distress.  He is alert and oriented.   vitals were not taken for this visit. He exhibits no respiratory distress or labored breathing. He appears neurologically intact.  His mood is pleasant.  His affect is appropriate.  Please note the digital rectal exam findings described above.  In general this is a well appearing caucasian male in no acute distress. He's alert and oriented x4 and appropriate throughout the examination. Cardiopulmonary assessment is negative for acute distress and exhibits normal effort.   KPS = 100  100 - Normal; no complaints; no evidence of disease. 90   - Able to carry on normal activity; minor signs or symptoms of disease. 80   - Normal activity with effort; some signs or symptoms of disease. 28   - Cares for self; unable to carry on normal activity or to do active work. 60   - Requires occasional assistance, but is able to care for most of his personal needs. 50   - Requires considerable assistance and frequent medical care. 78   - Disabled; requires special care and assistance. 82   - Severely disabled; hospital admission is indicated although death not imminent. 36   - Very sick; hospital admission necessary; active supportive treatment necessary. 10   - Moribund; fatal processes progressing rapidly. 0     - Dead  Karnofsky DA, Abelmann Parker, Craver LS and Burchenal [facility] [phone]) The use of the nitrogen mustards in the palliative treatment of carcinoma: with  particular reference to bronchogenic carcinoma Cancer 1 [id number]   LABORATORY DATA:  Lab Results  Component Value Date   WBC 9.1 03/17/2015   HGB 15.6 01/12/2016   HCT 46.0 01/12/2016   MCV 95.1 03/17/2015   PLT 237.0 03/17/2015   Lab Results  Component Value Date   NA 143 01/12/2016   K 3.8 01/12/2016   CL 104 01/12/2016   CO2 28 07/23/2015   Lab Results  Component Value Date   ALT 25 03/17/2015   AST 19 03/17/2015   ALKPHOS 69 03/17/2015   BILITOT 0.7 03/17/2015     RADIOGRAPHY: Nm Bone Scan Whole Body  01/29/2016  CLINICAL DATA:  Prostate cancer. EXAM: NUCLEAR MEDICINE WHOLE BODY BONE SCAN TECHNIQUE: Whole body anterior and posterior images were obtained approximately 3 hours after intravenous injection of radiopharmaceutical. RADIOPHARMACEUTICALS:  27.0 mCi Technetium-85m MDP IV COMPARISON: COMPARISON CT 07/20/2013. FINDINGS: Bilateral renal function and excretion. Increased activity noted about the right knee most likely degenerative. Right knee series can be obtained for further evaluation. No evidence of metastatic disease. IMPRESSION: Increased activity  noted about the right knee, most likely degenerative. Right knee series can be obtained for further evaluation. Exam otherwise unremarkable. No evidence of metastatic disease. Electronically Signed   By: Marcello Moores  Register   On: 01/29/2016 12:04   Mr Prostate W / Wo Cm  02/05/2016  CLINICAL DATA:  Biopsy-proven prostate cancer.  Elevated PSA. EXAM: MR PROSTATE WITHOUT AND WITH CONTRAST TECHNIQUE: Multiplanar multisequence MRI images were obtained of the pelvis centered about the prostate. Pre and post contrast images were obtained. CONTRAST:  5mL MULTIHANCE GADOBENATE DIMEGLUMINE 529 MG/ML IV SOLN COMPARISON:  None. FINDINGS: Prostate: Focus of restricted diffusion within the LEFT apex measures 14 mm by 9 mm (image 10, series 1200). 12 mm lesion with low signal intensity on T2 weighted imaging is also seen at this location. Low  signal intensity on T2 imaging extends to the LEFT lateral mid gland in a thin subcapsular band (image number 10, series 9). Region of low signal intensity at the RIGHT lateral mid gland measures approximately 9 mm by 9 mm on diffusion weighted series 1200. The prostatic capsule appears intact. No gross evidence of transscapular spread. The most concerning area for potential transscapular spread would be posteriorly at the LEFT apex with there is difficult to define a tissue plane between the anterior rectum and LEFT apex (image 20, series 6). Seminal vesicles are normal.  Neurovascular bundles are intact. The central gland is nodular with well capsulated nodules. Transcapsular spread:  Absent, see description above Seminal vesicle involvement: Absent Neurovascular bundle involvement: Absent Pelvic adenopathy: Absent Bone metastasis: Absent Other findings: Prostate volume equals 4.2 x 5.4 by 5.0 cm (57 cubic cm). IMPRESSION: 1. High-grade carcinoma involving the LEFT apex and LEFT lateral mid gland. 2. High-grade carcinoma involving the RIGHT lateral mid gland. 3. No clear evidence of extracapsular extension. 4. Normal seminal vesicles. 5. No lymphadenopathy or skeletal metastasis. Electronically Signed   By: Suzy Bouchard M.D.   On: 02/05/2016 12:36    IMPRESSION: This gentleman is a 75 yo with Stage T2b adenocarcinoma of the prostate with a Gleason score of 4+3 and a PSA of 4.2.  His T-Stage, Gleason's Score, and PSA put him into the intermediate risk group.  Accordingly he is eligible for a variety of potential treatment options including surgery, external radiation with ADT, or external radiation plus seed boost.   PLAN: Today I reviewed the findings and workup thus far.  We discussed the natural history of prostate cancer.  We reviewed the the implications of T-stage, Gleason's Score, and PSA on decision-making and outcomes in prostate cancer.  We discussed radiation treatment in the management of  prostate cancer with regard to the logistics and delivery of external beam radiation treatment as well as the logistics and delivery of prostate brachytherapy.  We compared and contrasted each of these approaches and also compared these against prostatectomy.  The patient expressed interest in external beam radiotherapy.  I filled out a patient counseling form for him with relevant treatment diagrams and we retained a copy for our records.   The patient would like to proceed with prostate IMRT.  I will share my findings with Dr. Alyson Ingles and move forward with scheduling placement of three gold fiducial markers into the prostate to proceed with IMRT in the near future. He follows up with Dr. Alyson Ingles in August. The patient is interested in having an MRI for CT simulation versus urethrogram.   I enjoyed meeting with him today, and will look forward to participating in the  care of this very nice gentleman.     ------------------------------------------------   Tyler Pita, MD [place] Director and Director of Stereotactic Radiosurgery Direct Dial: [phone]  Fax: [phone] [email or web address]  Skype  [email or web address]  This document serves as a record of services personally performed by Tyler Pita, MD and Shona Simpson PAC. It was created on his behalf by Arlyce Harman, a trained medical scribe. The creation of this record is based on the scribe's personal observations and the provider's statements to them. This document has been checked and approved by the attending provider.

## 2016-02-13 NOTE — Consult Note (Signed)
Office Visit Report     02/13/2016   --------------------------------------------------------------------------------   Eric Brock  MRN: [id number]  PRIMARY CARE:  Carolann Littler, MD  DOB: [birth year], 75 year old Male  REFERRING:  Carolann Littler, MD  SSN: [id number]  PROVIDER:  Raynelle Bring, M.D.    LOCATION:  [facility] [phone]   --------------------------------------------------------------------------------   CC: Prostate Cancer  HPI: Eric Brock is a 75 year-old male established patient who is here to discuss treatment/management options for prostate cancer.  Physician requesting consult: Dr. Nicolette Bang.   Location of consult: [facility].   Primary care provider: Dr. Carolann Littler.   He was noted to have a PSA of 4.2. He was noted to have a(n) abnormal digital rectal exam. His suspicious abnormality was noted at the right apex, right mid aspect, and right base of the prostate. He underwent a TRUS biopsy of the prostate on 01/12/2016. This confirmed Gleason 4+3=7 adenocarcinoma of the prostate. 12 out of 12 cores were positive for malignancy. He has a family history of prostate cancer (brother).   He has undergone the following staging studies: Bone scan (01/29/16) - No metastatic disease, degenerative changes of right knee, MRI of prostate (02/05/16) - Lesions at L apex (14 mm), L lateral mid (12 mm), and R lateral mid (9 mm), no definite EPE although some concern for loss of tissue plane at left apex, no SVI or LAD.   His past medical history is significant for Non-Hodgkin's lymphoma, history of umbilical hernia repair, and history of cholecystectomy.   TNM stage: cT2b N0 M0.   PSA: 4.2.   Gleason score: 4+3=7.   Biopsy date: 01/12/2016.   Biopsy: 12/12 cores positive -- L lateral apex (80%, 3+3=6), L apex (60%, 3+4=7), L lateral mid (90%, 3+4=7), L mid (20%, 3+3=6), L  lateral base (10%, 3+4=7, PNI), L base (40%, 3+3=6), R apex (80%, 3+4=7), R lateral apex (100%, 3+4=7), R mid (20%, 3+3=6), R lateral mid (50%, 3+3=6), R base (40%, 3+4=7, PNI), R lateral base (100%, 4+3=7, PNI).   Prostate volume: 35.0 cc .   Organ Confined disease: 27%.   Extraprostatic extension: 70%.   Seminal vesicle involvement: 13%.   LNI: 17%.   5 year PFS (surgery): 46%.   10 year PFS (surgery): 31%.   IPSS: He has well controlled voiding symptoms on tamsulosin.   SHIM score: He has moderate to severe erectile dysfunction.     ALLERGIES: No Allergies    MEDICATIONS: Aleve TABS Oral  Allegra 180 MG TABS Oral  Centrum Men TABS Oral  Ibuprofen 200 MG Oral Tablet Oral  Mucinex Sinus-Max CAPS Oral  Tamsulosin HCl - 0.4 MG Oral Capsule 0 Oral Daily  Valium 10 mg tablet 1 hr prior to MRI     GU PSH: Prostate Needle Biopsy - 01/12/2016 Rpr Umbil Hern; Reduc < 5 Yr - 11/13/2015      PSH Notes: Cholecystectomy, Umbilical Hernia Repair   NON-GU PSH: Cholecystectomy - 11/13/2015    GU PMH: Prostate Cancer - 01/19/2016 Nocturia, Nocturia - 11/17/2015 Elevated PSA, Elevated PSA - 11/13/2015 Nodular prostate w/ LUTS, Nodular prostate with lower urinary tract symptoms - 11/13/2015    NON-GU PMH: Encounter for general adult medical examination without abnormal findings, Encounter for preventive health examination - 11/13/2015 Non-Hodgkin lymphoma, unspecified, lymph nodes of head, face, and neck, Lymphoma, head, face, or neck - 11/13/2015 Personal history of other diseases of the  respiratory system, History of bronchiectasis - 11/13/2015    FAMILY HISTORY: Brain tumor - Runs In Family Death of family member - Runs In Family malignant neoplasm of prostate - Runs In Family Myocardial Infarction - Runs In Family   SOCIAL HISTORY: Marital Status: Married Current Smoking Status: Patient does not smoke anymore.  Drinks 1 drink per day. Types of alcohol consumed: Wine.  Drinks 3  caffeinated drinks per day. Patient's occupation is/was Retired.     Notes: Married, Alcohol use, Number of children, Former smoker, Retired, Caffeine use   REVIEW OF SYSTEMS:    GU Review Male:   Patient denies frequent urination, hard to postpone urination, burning/ pain with urination, get up at night to urinate, leakage of urine, stream starts and stops, trouble starting your streams, and have to strain to urinate .  Gastrointestinal (Lower):   Patient denies diarrhea and constipation.  Gastrointestinal (Upper):   Patient denies nausea and vomiting.  Constitutional:   Patient denies fever, night sweats, weight loss, and fatigue.  Skin:   Patient denies skin rash/ lesion and itching.  Eyes:   Patient denies blurred vision and double vision.  Ears/ Nose/ Throat:   Patient denies sore throat and sinus problems.  Hematologic/Lymphatic:   Patient denies swollen glands and easy bruising.  Cardiovascular:   Patient denies leg swelling and chest pains.  Respiratory:   Patient denies cough and shortness of breath.  Endocrine:   Patient denies excessive thirst.  Musculoskeletal:   Patient denies back pain and joint pain.  Neurological:   Patient denies headaches and dizziness.  Psychologic:   Patient denies depression and anxiety.   VITAL SIGNS: None   MULTI-SYSTEM PHYSICAL EXAMINATION:    Constitutional: Well-nourished. No physical deformities. Normally developed. Good grooming.     PAST DATA REVIEWED:  Source Of History:  Patient  Lab Test Review:   PSA  Records Review:   Pathology Reports, Previous Patient Records  X-Ray Review: MRI Prostate [facility]: Reviewed Films.  Bone Scan: Reviewed Films.     PROCEDURES: None   ASSESSMENT:      ICD-10 Details  1 GU:   Prostate Cancer - C61    PLAN:           Document Letter(s):  Created for Patient: Clinical Summary         Notes:   1. Prostate cancer: I have had a detailed discussion with Eric Brock and his wife today. The patient was  counseled about the natural history of prostate cancer and the standard treatment options that are available for prostate cancer. It was explained to him how his age and life expectancy, clinical stage, Gleason score, and PSA affect his prognosis, the decision to proceed with additional staging studies, as well as how that information influences recommended treatment strategies. We discussed the roles for active surveillance, radiation therapy, surgical therapy, androgen deprivation, as well as ablative therapy options for the treatment of prostate cancer as appropriate to his individual cancer situation. We discussed the risks and benefits of these options with regard to their impact on cancer control and also in terms of potential adverse events, complications, and impact on quality of life particularly related to urinary and sexual function. The patient was encouraged to ask questions throughout the discussion today and all questions were answered to his stated satisfaction. In addition, the patient was provided with and/or directed to appropriate resources and literature for further education about prostate cancer and treatment options.   He has already  seen Dr. Tammi Klippel and discussed ADT and radiation. We specifically discussed the pros and cons of primary surgical therapy vs primary radiation therapy in this setting. Considering his very high volume, and high/intermediate risk disease, he is likely to require additional therapy after surgery. Considering his age and the fact he is not opposed to ADT, we agreed that he would be best served proceeding with ADT and XRT. Dr. Tammi Klippel has discussed an approach of 5 weeks of IMRT followed by a seed implant boost. I will notify Dr. Alyson Ingles of the patient's decision.   cc: Dr. Nicolette Bang  Dr. Tyler Pita  Dr. Zola Button  Dr. Carolann Littler          E & M CODE: I spent at least 40 minutes face to face with the patient, more than 50% of that  time was spent on counseling and/or coordinating care.

## 2016-02-13 NOTE — Progress Notes (Signed)
                               Care Plan Summary  Name: Eric Brock DOB: 1941/07/31   Your Medical Team:   Urologist -  Dr. Raynelle Bring, [facility]  Radiation Oncologist - Dr. Tyler Pita, [facility]   Medical Oncologist - Dr. Zola Button, [place]  Recommendations: 1) Androgen Deprivation Therapy 2) Radiation Therapy 3)         Surgery/ Robotic Prostatectomy * These recommendations are based on information available as of today's consult.      Recommendations may change depending on the results of further tests or exams.  Next Steps: 1) Dr. Alyson Ingles will schedule Androgen Deprivation injection(hormone therapy) and gold markers  2) Dr. Johny Shears office will schedule CT simulation/ Radiation Therapy   When appointments need to be scheduled, you will be contacted by [facility] and/or [facility].  Questions?  Please do not hesitate to call Cira Rue, RN, BSN, OCN at [phone] any questions or concerns.  Eric Brock is your Oncology Nurse Navigator and is available to assist you while you're receiving your medical care at [facility].

## 2016-02-16 ENCOUNTER — Encounter: Payer: Self-pay | Admitting: General Practice

## 2016-02-16 NOTE — Progress Notes (Signed)
Spiritual Care Note  Met Mr Russ and spouse in [facility] on 02/13/16.  Note that initial distress screen results were entered a week ago; entered Friday's under "Other [Prostate Multidisciplinary Clinic]," which cannot be pasted into this note.  During encounter, pt specified that PMDC helped reduce distress from 10 to 7.  Per pt, he feels "very well cared for" at [facility], having had previous lymphoma tx at [facility].  He and spouse report good support from their church, [employer].  They have two sons.  Mr Schreifels states that he does not want to talk about his distress and has hx "bad nerves" and perhaps a pattern of "seeking out stressful jobs."  He also shared that his brother died from prostate ca that was too advanced for proactive tx when diagnosed, noting that this stirs up grief for him.  Assured him that talking about his feelings and experience is always up to him, and that [facility] team is available for him and family as desired.  Briefly reviewed [employer].  Please also page if needs arise/circumstances change.  Thank you.  [place], [place], [facility] Pager [phone] Voicemail [phone]

## 2016-02-17 ENCOUNTER — Encounter: Payer: Self-pay | Admitting: Oncology

## 2016-02-17 ENCOUNTER — Encounter: Payer: Self-pay | Admitting: *Deleted

## 2016-02-17 NOTE — Addendum Note (Signed)
Encounter addended by: Heywood Footman, RN on: 02/17/2016 11:07 AM<BR>     Documentation filed: Charges VN

## 2016-03-04 DIAGNOSIS — C61 Malignant neoplasm of prostate: Secondary | ICD-10-CM | POA: Diagnosis not present

## 2016-03-05 ENCOUNTER — Other Ambulatory Visit: Payer: Self-pay | Admitting: Urology

## 2016-03-09 DIAGNOSIS — M1711 Unilateral primary osteoarthritis, right knee: Secondary | ICD-10-CM | POA: Diagnosis not present

## 2016-03-09 DIAGNOSIS — M7061 Trochanteric bursitis, right hip: Secondary | ICD-10-CM | POA: Diagnosis not present

## 2016-03-11 ENCOUNTER — Encounter: Payer: Self-pay | Admitting: Medical Oncology

## 2016-03-12 ENCOUNTER — Encounter: Payer: Self-pay | Admitting: Medical Oncology

## 2016-03-12 ENCOUNTER — Telehealth: Payer: Self-pay | Admitting: Radiation Oncology

## 2016-03-12 NOTE — Telephone Encounter (Signed)
I spoke with the patient. He has received his first injection of firmagon on 8/3 and has plans for his next on 9/5. He will have gold fiducial markers placed on 9/15 and was given an appt on 10/5 for simulation. Dr. Tammi Klippel is ok with the pt not having a retrograde urethrogram and instead is ok with fusing his MRI into his CT for simulation. Pt is in agreement to have tattoo placement.

## 2016-03-12 NOTE — Progress Notes (Signed)
Eric Brock called stating that he had his first Androgen Deprivation injection 8/3 and scheduled for his second injection 9/7. He will have his gold markers placed on 10/5. He asked when he will begin radiation. I will have Shona Simpson, PA with Dr.Manning call him to discuss his CT sim and date of start. He voiced understanding.

## 2016-03-12 NOTE — Progress Notes (Signed)
Oncology Nurse Navigator Documentation  Oncology Nurse Navigator Flowsheets 02/13/2016 03/11/2016 03/12/2016  Navigator Location - - -  Navigator Encounter Type Clinic/[facility] Telephone Telephone  Telephone - Outgoing Call;Clinic/[facility] Follow-up- request return call Incoming Call;Appt Confirmation/Clarification  Abnormal Finding Date - - -  Confirmed Diagnosis Date - - -  Treatment Initiated Date - - 03/04/2016  Patient Visit Type Initial - -  Treatment Phase Pre-Tx/Tx Discussion - -  Barriers/Navigation Needs Education - Education;Coordination of Care  Education Understanding Cancer/ Treatment Options - Preparing for Upcoming Surgery/ Treatment  Interventions Education Method - Education Method  Coordination of Care - - -  Education Method Teach-back;Verbal;Written - Teach-back;Verbal  Support Groups/Services Prostate Support Group;Friends and Family - Friends and Family  Acuity Level 1 - -  Acuity Level 1 Initial guidance, education and coordination as needed - -  Acuity Level 2 - - -  Time Spent with Patient 120 15 15

## 2016-03-16 ENCOUNTER — Encounter: Payer: Self-pay | Admitting: *Deleted

## 2016-03-22 ENCOUNTER — Encounter: Payer: Self-pay | Admitting: Emergency Medicine

## 2016-03-22 ENCOUNTER — Ambulatory Visit (INDEPENDENT_AMBULATORY_CARE_PROVIDER_SITE_OTHER): Payer: Medicare Other | Admitting: Emergency Medicine

## 2016-03-22 DIAGNOSIS — J471 Bronchiectasis with (acute) exacerbation: Secondary | ICD-10-CM | POA: Diagnosis not present

## 2016-03-22 DIAGNOSIS — M1711 Unilateral primary osteoarthritis, right knee: Secondary | ICD-10-CM | POA: Diagnosis not present

## 2016-03-22 NOTE — Patient Instructions (Signed)
Please continue same medications as you are taking them Stay active Drink plenty of water and use your flutter valve if needed.  We can revisit timing of a repeat chest CT scan in the future Follow with Dr Lamonte Sakai in 10 months or sooner if you have any problems

## 2016-03-22 NOTE — Assessment & Plan Note (Signed)
Stable at this time on a good allergy regimen plus Mucinex.Marland Kitchen He only uses his flutter valve as needed which seems to be adequate. He has not had a CT scan of the chest since 2014. In absence of any significant symptoms believe we can wait to repeat the scan. We will revisit this at his next visit.  Please continue same medications as you are taking them Stay active Drink plenty of water and use your flutter valve if needed.  We can revisit timing of a repeat chest CT scan in the future Follow with Dr Lamonte Sakai in 10 months or sooner if you have any problems

## 2016-03-22 NOTE — Progress Notes (Signed)
Subjective:    Patient ID: Eric Brock, male    DOB: [birth year], 75 y.o.   MRN: [id number]  HPI 75 year old man with bronchiectasis and chronic allergic rhinitis. He's last seen by me over 2 years ago. He has been seen on multiple occasions for acute exacerbations and treated for bronchitis.  His last CT scan of the chest was in December 2014. He is doing well on mucinex, allegra and flonase nasal spray.  His last flare was 12/15. He thought he had one in 11/16 but did not have to take the pred or levaquin prescribed.  Overall doing quite well.     Review of Systems As above  Past Medical History:  Diagnosis Date  . Allergic rhinitis   . Arthritis   . Bronchiectasis pulmologist-  dr Vaughan Browner ([place])   intermittant--  per pt last bout yr ago 2016  . Elevated PSA   . History of colon polyps    1999  . History of concussion    2005 and 2010 with mild cerebral bleed resolved -no residual  . History of non-Hodgkin's lymphoma oncologist-  dr Benay Spice--  pt in remission   dx 03/ 2000  ,  Stage IA,  scalp/forehead ,  post chemo/radioation therapy  . Hyperlipidemia    diet controlled, no med  . Lower urinary tract symptoms (LUTS)   . Prostate cancer ([place])   . Wears glasses      Family History  Problem Relation Age of Onset  . Brain cancer Mother   . Heart disease Father   . Cancer Brother     prostate  . Colon cancer Neg Hx   . Colon polyps Neg Hx   . Esophageal cancer Neg Hx   . Rectal cancer Neg Hx   . Stomach cancer Neg Hx      Social History   Social History  . Marital status: Married    Spouse name: N/A  . Number of children: N/A  . Years of education: N/A   Occupational History  . retired Retired   Social History Main Topics  . Smoking status: Former Smoker    Packs/day: 1.00    Years: 12.00    Types: Cigarettes    Quit date: 08/03/1975  . Smokeless tobacco: Never Used  . Alcohol use 4.2 oz/week    7 Glasses of wine per week     Comment: 1 glass of  wine daily  . Drug use: No  . Sexual activity: Yes   Other Topics Concern  . Not on file   Social History Narrative  . No narrative on file     No Known Allergies   Outpatient Medications Prior to Visit  Medication Sig Dispense Refill  . fexofenadine (ALLEGRA) 180 MG tablet Take 180 mg by mouth every morning.    . GuaiFENesin (MUCINEX PO) Take 1,200 mg by mouth 2 (two) times daily. Extended release    . ibuprofen (ADVIL,MOTRIN) 200 MG tablet Take 200 mg by mouth every 6 (six) hours as needed.     . Multiple Vitamins-Minerals (CENTRUM ADULTS PO) Take 1 tablet by mouth daily.    . Naproxen Sodium (ALEVE) 220 MG CAPS Take by mouth as needed.    . tamsulosin (FLOMAX) 0.4 MG CAPS capsule Take 0.4 mg by mouth daily after supper.     . diazepam (VALIUM) 10 MG tablet Reported on 02/09/2016     No facility-administered medications prior to visit.  Objective:   Physical Exam Vitals:   03/22/16 1619  BP: 132/70  Pulse: 60  SpO2: 95%  Weight: 181 lb (82.1 kg)  Height: 5\' 10"  (1.778 m)   Gen: Pleasant, well-nourished, in no distress,  normal affect  ENT: No lesions,  mouth clear,  oropharynx clear, no postnasal drip  Neck: No JVD, no TMG, no carotid bruits  Lungs: No use of accessory muscles, Right middle lobe inspiratory rhonchi, no wheezing  Cardiovascular: RRR, heart sounds normal, no murmur or gallops, no peripheral edema  Musculoskeletal: No deformities, no cyanosis or clubbing  Neuro: alert, non focal  Skin: Warm, no lesions or rashes      Assessment & Plan:  BRONCHIECTASIS Stable at this time on a good allergy regimen plus Mucinex.Marland Kitchen He only uses his flutter valve as needed which seems to be adequate. He has not had a CT scan of the chest since 2014. In absence of any significant symptoms believe we can wait to repeat the scan. We will revisit this at his next visit.  Please continue same medications as you are taking them Stay active Drink plenty of water  and use your flutter valve if needed.  We can revisit timing of a repeat chest CT scan in the future Follow with Dr Lamonte Sakai in 10 months or sooner if you have any problems  Baltazar Apo, MD, PhD 03/22/2016, 4:53 PM [facility] [phone] or if no answer [phone]

## 2016-03-23 DIAGNOSIS — H1851 Endothelial corneal dystrophy: Secondary | ICD-10-CM | POA: Diagnosis not present

## 2016-03-23 DIAGNOSIS — H527 Unspecified disorder of refraction: Secondary | ICD-10-CM | POA: Diagnosis not present

## 2016-03-23 DIAGNOSIS — H26493 Other secondary cataract, bilateral: Secondary | ICD-10-CM | POA: Diagnosis not present

## 2016-03-29 DIAGNOSIS — M1711 Unilateral primary osteoarthritis, right knee: Secondary | ICD-10-CM | POA: Diagnosis not present

## 2016-04-06 DIAGNOSIS — C61 Malignant neoplasm of prostate: Secondary | ICD-10-CM | POA: Diagnosis not present

## 2016-04-07 DIAGNOSIS — M1711 Unilateral primary osteoarthritis, right knee: Secondary | ICD-10-CM | POA: Diagnosis not present

## 2016-04-13 ENCOUNTER — Encounter (HOSPITAL_BASED_OUTPATIENT_CLINIC_OR_DEPARTMENT_OTHER): Payer: Self-pay | Admitting: *Deleted

## 2016-04-13 NOTE — Progress Notes (Signed)
NPO AFTER MN.  ARRIVE AT 0945.  NEED ISTAT 8 AND KUB.  WILL DO FLEET ENEMA AM DOS .

## 2016-04-14 DIAGNOSIS — M1711 Unilateral primary osteoarthritis, right knee: Secondary | ICD-10-CM | POA: Diagnosis not present

## 2016-04-16 ENCOUNTER — Encounter (HOSPITAL_BASED_OUTPATIENT_CLINIC_OR_DEPARTMENT_OTHER): Payer: Self-pay | Admitting: Anesthesiology

## 2016-04-16 ENCOUNTER — Ambulatory Visit (HOSPITAL_COMMUNITY): Payer: Medicare Other

## 2016-04-16 ENCOUNTER — Ambulatory Visit (HOSPITAL_BASED_OUTPATIENT_CLINIC_OR_DEPARTMENT_OTHER): Payer: Medicare Other | Admitting: Anesthesiology

## 2016-04-16 ENCOUNTER — Ambulatory Visit (HOSPITAL_BASED_OUTPATIENT_CLINIC_OR_DEPARTMENT_OTHER)
Admission: RE | Admit: 2016-04-16 | Discharge: 2016-04-16 | Disposition: A | Discharge status: Home / Self Care | Payer: Medicare Other | Source: Ambulatory Visit | Attending: Urology | Admitting: Urology

## 2016-04-16 ENCOUNTER — Encounter (HOSPITAL_BASED_OUTPATIENT_CLINIC_OR_DEPARTMENT_OTHER)
Admission: RE | Disposition: A | Discharge status: Home / Self Care | Payer: Self-pay | Source: Ambulatory Visit | Attending: Urology

## 2016-04-16 DIAGNOSIS — C61 Malignant neoplasm of prostate: Secondary | ICD-10-CM | POA: Diagnosis not present

## 2016-04-16 DIAGNOSIS — Z87891 Personal history of nicotine dependence: Secondary | ICD-10-CM | POA: Diagnosis not present

## 2016-04-16 DIAGNOSIS — Z8572 Personal history of non-Hodgkin lymphomas: Secondary | ICD-10-CM | POA: Insufficient documentation

## 2016-04-16 DIAGNOSIS — Z79899 Other long term (current) drug therapy: Secondary | ICD-10-CM | POA: Insufficient documentation

## 2016-04-16 DIAGNOSIS — J309 Allergic rhinitis, unspecified: Secondary | ICD-10-CM | POA: Diagnosis not present

## 2016-04-16 DIAGNOSIS — Z8546 Personal history of malignant neoplasm of prostate: Secondary | ICD-10-CM

## 2016-04-16 DIAGNOSIS — E785 Hyperlipidemia, unspecified: Secondary | ICD-10-CM | POA: Diagnosis not present

## 2016-04-16 HISTORY — PX: GOLD SEED IMPLANT: SHX6343

## 2016-04-16 HISTORY — DX: Dermatitis, unspecified: L30.9

## 2016-04-16 LAB — POCT I-STAT, CHEM 8
BUN: 21 mg/dL — AB (ref 6–20)
CALCIUM ION: 1.14 mmol/L — AB (ref 1.15–1.40)
CHLORIDE: 104 mmol/L (ref 101–111)
Creatinine, Ser: 0.8 mg/dL (ref 0.61–1.24)
GLUCOSE: 111 mg/dL — AB (ref 65–99)
HCT: 44 % (ref 39.0–52.0)
Hemoglobin: 15 g/dL (ref 13.0–17.0)
Potassium: 5.7 mmol/L — ABNORMAL HIGH (ref 3.5–5.1)
SODIUM: 140 mmol/L (ref 135–145)
TCO2: 25 mmol/L (ref 0–100)

## 2016-04-16 SURGERY — INSERTION, GOLD SEEDS
Anesthesia: Monitor Anesthesia Care | Site: Prostate

## 2016-04-16 MED ORDER — FENTANYL CITRATE (PF) 100 MCG/2ML IJ SOLN
INTRAMUSCULAR | Status: AC
  Filled 2016-04-16: qty 2

## 2016-04-16 MED ORDER — LACTATED RINGERS IV SOLN
INTRAVENOUS | Status: DC
  Administered 2016-04-16: 11:00:00 via INTRAVENOUS
  Filled 2016-04-16: qty 1000

## 2016-04-16 MED ORDER — BELLADONNA ALKALOIDS-OPIUM 16.2-60 MG RE SUPP
RECTAL | Status: AC
  Filled 2016-04-16: qty 1

## 2016-04-16 MED ORDER — PROPOFOL 10 MG/ML IV BOLUS
INTRAVENOUS | Status: AC
  Filled 2016-04-16: qty 40

## 2016-04-16 MED ORDER — FLEET ENEMA 7-19 GM/118ML RE ENEM
1.0000 | ENEMA | Freq: Once | RECTAL | Status: DC
  Filled 2016-04-16: qty 1

## 2016-04-16 MED ORDER — PROPOFOL 10 MG/ML IV BOLUS
INTRAVENOUS | Status: DC | PRN
  Administered 2016-04-16: 70 mg via INTRAVENOUS

## 2016-04-16 MED ORDER — GENTAMICIN SULFATE 40 MG/ML IJ SOLN
5.0000 mg/kg | INTRAVENOUS | Status: AC
  Administered 2016-04-16: 390 mg via INTRAVENOUS
  Filled 2016-04-16: qty 9.75

## 2016-04-16 MED ORDER — LIDOCAINE 2% (20 MG/ML) 5 ML SYRINGE
INTRAMUSCULAR | Status: AC
  Filled 2016-04-16: qty 5

## 2016-04-16 MED ORDER — GENTAMICIN IN SALINE 1.6-0.9 MG/ML-% IV SOLN
80.0000 mg | INTRAVENOUS | Status: DC
  Filled 2016-04-16: qty 50

## 2016-04-16 MED ORDER — FENTANYL CITRATE (PF) 100 MCG/2ML IJ SOLN
INTRAMUSCULAR | Status: DC | PRN
  Administered 2016-04-16: 50 ug via INTRAVENOUS

## 2016-04-16 SURGICAL SUPPLY — 3 items
MARKER GOLD PRELOAD 1.2X3 (Urological Implant) ×1 IMPLANT
SEED GOLD PRELOAD 1.2X3 (Urological Implant) ×6 IMPLANT
UNDERPAD 30X30 INCONTINENT (UNDERPADS AND DIAPERS) ×3 IMPLANT

## 2016-04-16 NOTE — Anesthesia Procedure Notes (Signed)
Procedure Name: MAC Performed by: ,  T Pre-anesthesia Checklist: Patient identified, Timeout performed, Emergency Drugs available, Suction available and Patient being monitored Oxygen Delivery Method: Nasal cannula Placement Confirmation: positive ETCO2       

## 2016-04-16 NOTE — Progress Notes (Signed)
Spoke with MD McKenzie, patient does not have to void before discharge.

## 2016-04-16 NOTE — Transfer of Care (Signed)
Immediate Anesthesia Transfer of Care Note  Patient: Eric Brock  Procedure(s) Performed: Procedure(s): GOLD SEED IMPLANT (N/A)  Patient Location: PACU and Short Stay  Anesthesia Type:MAC  Level of Consciousness: awake, alert  and oriented  Airway & Oxygen Therapy: Patient Spontanous Breathing  Post-op Assessment: Report given to RN  Post vital signs: Reviewed and stable  Last Vitals: 139/80, 69, 16, 94%, 98.4 Vitals:   04/16/16 1003 04/16/16 1201  BP: (!) 141/75 139/80  Pulse: 72 69  Resp: 16 16  Temp: 36.8 C 36.9 C    Last Pain:  Vitals:   04/16/16 1003  TempSrc: Oral      Patients Stated Pain Goal: 3 ([id number])  Complications: No apparent anesthesia complications

## 2016-04-16 NOTE — H&P (Signed)
Urology Admission H&P  Chief Complaint: prostate cancer  History of Present Illness: [name] is a 75 yo with a hx of prostate cancer here for gold seed placement prior to IMRT  Past Medical History:  Diagnosis Date  . Allergic rhinitis   . Arthritis   . Bronchiectasis pulmologist-  dr Lamonte Sakai ([place])--  per lov note 03-22-2016 stable   intermittant--  per pt last bout yr ago 2016   . Eczema   . Elevated PSA   . History of colon polyps    1999  . History of concussion    2005 and 2010 with mild cerebral bleed resolved -no residual  . History of non-Hodgkin's lymphoma oncologist-  dr Benay Spice--  pt in remission   dx 03/ 2000  ,  Stage IA,  scalp/forehead ,  post chemo/radioation therapy  . Hyperlipidemia    diet controlled, no med  . Lower urinary tract symptoms (LUTS)   . Prostate cancer [facility]) urologist-  dr Alyson Ingles  oncologist-  dr Alen Blew and dr Tammi Klippel   cT2b N0 M0, Gleason 4+3,  PSA 4.2,  vol 35.0cc--  plan IMRT w/ gold seeds and ADT  . Wears glasses    Past Surgical History:  Procedure Laterality Date  . CATARACT EXTRACTION W/ INTRAOCULAR LENS  IMPLANT, BILATERAL  2013  . CHOLECYSTECTOMY  1994  . COLONOSCOPY  last one 07-14-2015  . PROSTATE BIOPSY N/A 01/12/2016   Procedure: BIOPSY TRANSRECTAL ULTRASONIC PROSTATE (TUBP);  Surgeon: Cleon Gustin, MD;  Location: [facility];  Service: Urology;  Laterality: N/A;  . UMBILICAL HERNIA REPAIR  1992    Home Medications:  Prescriptions Prior to Admission  Medication Sig Dispense Refill Last Dose  . fexofenadine (ALLEGRA) 180 MG tablet Take 180 mg by mouth every morning.   04/15/2016 at Unknown time  . fluticasone (FLONASE) 50 MCG/ACT nasal spray Place 2 sprays into both nostrils daily as needed for allergies or rhinitis.   04/13/2016  . GuaiFENesin (MUCINEX PO) Take 1,200 mg by mouth 2 (two) times daily. Extended release   04/15/2016 at Unknown time  . ibuprofen (ADVIL,MOTRIN) 200 MG tablet Take 200 mg by  mouth every 6 (six) hours as needed.    04/13/2016  . Multiple Vitamins-Minerals (CENTRUM ADULTS PO) Take 1 tablet by mouth daily.   Past Week at Unknown time  . Naproxen Sodium (ALEVE) 220 MG CAPS Take by mouth as needed.   04/13/2016  . tamsulosin (FLOMAX) 0.4 MG CAPS capsule Take 0.4 mg by mouth daily after supper.    04/15/2016 at Unknown time   Allergies: No Known Allergies  Family History  Problem Relation Age of Onset  . Heart disease Father   . Cancer Brother     prostate  . Brain cancer Mother   . Colon cancer Neg Hx   . Colon polyps Neg Hx   . Esophageal cancer Neg Hx   . Rectal cancer Neg Hx   . Stomach cancer Neg Hx    Social History:  reports that he quit smoking about 40 years ago. His smoking use included Cigarettes. He has a 12.00 pack-year smoking history. He has never used smokeless tobacco. He reports that he drinks about 4.2 oz of alcohol per week . He reports that he does not use drugs.  Review of Systems  All other systems reviewed and are negative.   Physical Exam:  Vital signs in last 24 hours: Temp:  [98.3 F (36.8 C)] 98.3 F (36.8 C) (09/15 1003)  Pulse Rate:  [72] 72 (09/15 1003) Resp:  [16] 16 (09/15 1003) BP: (141)/(75) 141/75 (09/15 1003) SpO2:  [98 %] 98 % (09/15 1003) Weight:  [81.2 kg (179 lb)] 81.2 kg (179 lb) (09/15 1003) Physical Exam  Constitutional: He is oriented to person, place, and time. He appears well-developed and well-nourished.  HENT:  Head: Normocephalic and atraumatic.  Eyes: EOM are normal. Pupils are equal, round, and reactive to light.  Neck: Normal range of motion. No thyromegaly present.  Cardiovascular: Normal rate and regular rhythm.   Respiratory: Effort normal. No respiratory distress.  GI: Soft. He exhibits no distension.  Musculoskeletal: Normal range of motion. He exhibits no edema.  Neurological: He is alert and oriented to person, place, and time.  Skin: Skin is warm and dry.  Psychiatric: He has a normal mood  and affect. His behavior is normal. Judgment and thought content normal.    Laboratory Data:  Results for orders placed or performed during the hospital encounter of 04/16/16 (from the past 24 hour(s))  I-STAT, chem 8     Status: Abnormal   Collection Time: 04/16/16 11:03 AM  Result Value Ref Range   Sodium 140 135 - 145 mmol/L   Potassium 5.7 (H) 3.5 - 5.1 mmol/L   Chloride 104 101 - 111 mmol/L   BUN 21 (H) 6 - 20 mg/dL   Creatinine, Ser 0.80 0.61 - 1.24 mg/dL   Glucose, Bld 111 (H) 65 - 99 mg/dL   Calcium, Ion 1.14 (L) 1.15 - 1.40 mmol/L   TCO2 25 0 - 100 mmol/L   Hemoglobin 15.0 13.0 - 17.0 g/dL   HCT 44.0 39.0 - 52.0 %   No results found for this or any previous visit (from the past 240 hour(s)). Creatinine:  Recent Labs  04/16/16 1103  CREATININE 0.80   Baseline Creatinine: 0.8  Impression/Assessment:  75yo with high risk prostate cancer  Plan:  The risks/benefits/alternatives to gold seed placement was explaiend to the patient and he understands and wishes to proceed with surgery  Nicolette Bang 04/16/2016, 11:42 AM

## 2016-04-16 NOTE — Anesthesia Preprocedure Evaluation (Addendum)
Anesthesia Evaluation  Patient identified by MRN, date of birth, ID band Patient awake    Reviewed: Allergy & Precautions, H&P , NPO status , Patient's Chart, lab work & pertinent test results  Airway Mallampati: II  TM Distance: >3 FB Neck ROM: Full    Dental  (+) Teeth Intact, Dental Advisory Given   Pulmonary former smoker,  Bronchiectasis   Pulmonary exam normal breath sounds clear to auscultation       Cardiovascular negative cardio ROS Normal cardiovascular exam Rhythm:Regular Rate:Normal     Neuro/Psych Hx/o concussion '05, '10- mild cerebral bleed - no residual negative neurological ROS  negative psych ROS   GI/Hepatic negative GI ROS, Neg liver ROS,   Endo/Other  negative endocrine ROSHyperlipidemia  Renal/GU negative Renal ROS   Prostate Ca    Musculoskeletal negative musculoskeletal ROS (+) Arthritis , Osteoarthritis,    Abdominal   Peds  Hematology negative hematology ROS (+) Hx/o Non Hodgkin's lymphoma Eczema   Anesthesia Other Findings   Reproductive/Obstetrics negative OB ROS                             Anesthesia Physical  Anesthesia Plan  ASA: II  Anesthesia Plan: MAC   Post-op Pain Management:    Induction: Intravenous  Airway Management Planned: Natural Airway, Nasal Cannula and Simple Face Mask  Additional Equipment:   Intra-op Plan:   Post-operative Plan:   Informed Consent: I have reviewed the patients History and Physical, chart, labs and discussed the procedure including the risks, benefits and alternatives for the proposed anesthesia with the patient or authorized representative who has indicated his/her understanding and acceptance.   Dental advisory given  Plan Discussed with: CRNA, Anesthesiologist and Surgeon  Anesthesia Plan Comments:        Anesthesia Quick Evaluation

## 2016-04-16 NOTE — Anesthesia Postprocedure Evaluation (Signed)
Anesthesia Post Note  Patient: Eric Brock  Procedure(s) Performed: Procedure(s) (LRB): GOLD SEED IMPLANT (N/A)  Patient location during evaluation: PACU Anesthesia Type: MAC Level of consciousness: awake and alert and oriented Pain management: pain level controlled Vital Signs Assessment: post-procedure vital signs reviewed and stable Respiratory status: spontaneous breathing, nonlabored ventilation and respiratory function stable Cardiovascular status: blood pressure returned to baseline and stable Postop Assessment: no signs of nausea or vomiting Anesthetic complications: no    Last Vitals:  Vitals:   04/16/16 1201 04/16/16 1308  BP: 139/80 122/65  Pulse: 69 69  Resp: 16 16  Temp: 36.9 C 36.7 C    Last Pain:  Vitals:   04/16/16 1003  TempSrc: Oral                 , A.

## 2016-04-18 NOTE — Op Note (Signed)
Pre op diagnosis: prostate cancer  Post op diagnosis: same       Procedure: Transrectal ultrasound of the prostate, Ultrasound Guided Prostate gold seed placement.   Attending: Nicolette Bang  Anesthesia: MAC  EBL: minimal  Antibiotics: Gentamicin  Drains: none  Findings:no hypoechoic or hyperechoic lesions   Indications: Pt is a 75yo male with a history of prostate cancer and is scheduled to undergo IMRT. He presents for Gold seed placement.    Procedure in detail: Prior to procedure consent was obtained. The patient was brought to the OR and a brief timeout was done to ensure correct patient, correct procedure, and correct site. MAC was administered and the patient was placed in a comfortable left lateral decubitus position.  A transrectal ultrasound of the prostate was performed.  Lidocaine (was/ was not)  instilled using ultrasound guidance into the junction of each seminal vesicle of the prostate.  3 Gold markers were placed into the prostate using the standard template and ultrasound guidance.  Accurate placement of the markers was confirmed.     The patient was instructed that he might have blood in his urine, blood in his bowel movement and blood in his semen in the post-procedure period.  He was also instructed to take their antibiotic tablet(s), which he has on hand.  Prostatic ultrasound with successful gold marker placement was well tolerated by the patient.  The patient was given strict instructions to call for fever, nausea, vomiting, flank pain, difficulties urinating, urinary frequency, or other illness.  He will proceed with radiation therapy treatments as planned..   Complications:. None.   Condition: Stable, extubated, transferred to PACU  Plan: Patient is to be discharged home.

## 2016-04-19 ENCOUNTER — Telehealth: Payer: Self-pay | Admitting: Medical Oncology

## 2016-04-19 ENCOUNTER — Encounter (HOSPITAL_BASED_OUTPATIENT_CLINIC_OR_DEPARTMENT_OTHER): Payer: Self-pay | Admitting: Urology

## 2016-04-19 NOTE — Telephone Encounter (Signed)
Mr. Eric Brock called stating he had gold markers placed for radiation on 9/18. He was given a date of October 5th but no time and  he is not sure what this appointment is for. I informed him this date is for tattoo placement for treatment planning. I need to confirm this appointment with Dr. Tammi Klippel and I will call him back to confirm. He voiced understanding.

## 2016-04-19 NOTE — Telephone Encounter (Signed)
Called Mr. Tenbusch to inform him of CT simulation 10/05 at 9 am.

## 2016-04-21 ENCOUNTER — Telehealth: Payer: Self-pay | Admitting: Family Medicine

## 2016-04-21 ENCOUNTER — Ambulatory Visit (INDEPENDENT_AMBULATORY_CARE_PROVIDER_SITE_OTHER): Payer: Medicare Other | Admitting: Family Medicine

## 2016-04-21 DIAGNOSIS — Z23 Encounter for immunization: Secondary | ICD-10-CM | POA: Diagnosis not present

## 2016-04-21 DIAGNOSIS — C61 Malignant neoplasm of prostate: Secondary | ICD-10-CM

## 2016-04-21 DIAGNOSIS — L309 Dermatitis, unspecified: Secondary | ICD-10-CM

## 2016-04-21 DIAGNOSIS — L570 Actinic keratosis: Secondary | ICD-10-CM

## 2016-04-21 DIAGNOSIS — M1711 Unilateral primary osteoarthritis, right knee: Secondary | ICD-10-CM | POA: Diagnosis not present

## 2016-04-21 MED ORDER — DESOXIMETASONE 0.25 % EX OINT: 1.0000 "application " | TOPICAL_OINTMENT | Freq: Two times a day (BID) | CUTANEOUS | 2 refills | Status: DC

## 2016-04-21 NOTE — Telephone Encounter (Signed)
See if they can use generic for this .  Generic should be available.

## 2016-04-21 NOTE — Patient Instructions (Signed)
Actinic Keratosis Actinic keratosis is a precancerous growth on the skin. This means it could develop into skin cancer if it is not treated. About 1% of actinic keratoses turn into skin cancer within a year. It is important to have all such growths removed to prevent them from developing into skin cancer. CAUSES  Actinic keratosis is caused by getting too much ultraviolet (UV) radiation from the sun or other UV light sources. RISK FACTORS Factors that increase your chances of getting actinic keratosis include:  Having light-colored skin and blue eyes.  Having blonde or red hair.  Spending a lot of time in the sun.  Age. The risk of actinic keratosis increases with age. SYMPTOMS  Actinic keratosis growths look like scaly, rough spots of skin. They can be as small as a pinhead or as big as a quarter. They may itch, hurt, or feel sensitive. Sometimes there is a little tag of pink or gray skin growing off them. In some cases, actinic keratoses are easier felt than seen. They do not go away with the use of moisturizing lotions or creams. Actinic keratoses appear most often on areas of skin that get a lot of sun exposure. These areas include the:  Scalp.  Face.  Ears.  Lips.  Upper back.  Backs of the hands.  Forearms. DIAGNOSIS  Your health care provider can usually tell what is wrong by performing a physical exam. A tissue sample (biopsy) may also be taken and examined under a microscope. TREATMENT  Actinic keratosis can be treated several ways. Most treatments can be done in your health care provider's office. Treatment options may include:  Curettage. A tool is used to gently scrape off the growth.  Cryosurgery. Liquid nitrogen is applied to the growth to freeze it. The growth eventually falls off the skin.  Medicated creams, such as 5-fluorouracil or imiquimod. The medicine destroys the cells in the growth.  Chemical peels. Chemicals are applied to the growth and the outer  layers of skin are peeled off.  Photodynamic therapy. A drug that makes your skin more sensitive to light is applied to the skin. A strong, blue light is aimed at the skin and destroys the growth. PREVENTION  To prevent future sun damage:  Try to avoid the sun between 10:00 a.m. and 4:00 p.m. when it is the strongest.  Use a sunscreen or sunblock with SPF 30 or greater.  Apply sunscreen at least 30 minutes before exposure to the sun.  Always wear protective hats, clothing, and sunglasses with UV protection.  Avoid medicines, herbs, and foods that increase your sensitivity to sunlight.  Avoid tanning beds. HOME CARE INSTRUCTIONS   If your skin was covered with a bandage, change and remove the bandage as directed by your health care provider.  Keep the treated area dry as directed by your health care provider.  Apply any creams as prescribed by your health care provider. Follow the directions carefully.  Check your skin regularly for any changes.  Visit a skin doctor (dermatologist) every year for a skin exam. SEEK MEDICAL CARE IF:   Your skin does not heal and becomes irritated, red, or bleeds.  You notice any changes or new growths on your skin.   This information is not intended to replace advice given to you by your health care provider. Make sure you discuss any questions you have with your health care provider.   Document Released: 10/15/2008 Document Revised: 08/09/2014 Document Reviewed: 08/30/2011 Elsevier Interactive Patient Education 2016 Elsevier   Inc.  

## 2016-04-21 NOTE — Telephone Encounter (Signed)
Pt does not want to pay 200.00 co pay for the topicort ointment the doctor ordered today. Pt would like a different med that is just as effective send to [facility]

## 2016-04-21 NOTE — Progress Notes (Signed)
Pre visit review using our clinic review tool, if applicable. No additional management support is needed unless otherwise documented below in the visit note. 

## 2016-04-21 NOTE — Telephone Encounter (Signed)
Any suggestions for alternates?

## 2016-04-21 NOTE — Progress Notes (Signed)
Subjective:     Patient ID: Eric Brock, male   DOB: 08/02/1941, 75 y.o.   MRN: [id number]  HPI Patient came in today for several items  Update on diagnosis of prostate cancer. He was diagnosed last year. He is being treated with hormonal therapy and will be getting radiation soon. No dysuria.  Left face rash. He noticed some rough feeling skin several months ago. No ulceration. No prior history of skin cancer.    Long-term rash on both hands. This occurs on the palms near the base of the palm. He had seen dermatologist previously but does not recall how this was treated. He does not have any history of pustular psoriasis. No other areas of rash. His rash is scaly and dry and sometimes itches. He has noted incidentally that when he has been on oral antibiotics his rash seems to be improved  Past Medical History:  Diagnosis Date  . Allergic rhinitis   . Arthritis   . Bronchiectasis pulmologist-  dr Lamonte Sakai ([place])--  per lov note 03-22-2016 stable   intermittant--  per pt last bout yr ago 2016   . Eczema   . Elevated PSA   . History of colon polyps    1999  . History of concussion    2005 and 2010 with mild cerebral bleed resolved -no residual  . History of non-Hodgkin's lymphoma oncologist-  dr Benay Spice--  pt in remission   dx 03/ 2000  ,  Stage IA,  scalp/forehead ,  post chemo/radioation therapy  . Hyperlipidemia    diet controlled, no med  . Lower urinary tract symptoms (LUTS)   . Prostate cancer [facility]) urologist-  dr Alyson Ingles  oncologist-  dr Alen Blew and dr Tammi Klippel   cT2b N0 M0, Gleason 4+3,  PSA 4.2,  vol 35.0cc--  plan IMRT w/ gold seeds and ADT  . Wears glasses    Past Surgical History:  Procedure Laterality Date  . CATARACT EXTRACTION W/ INTRAOCULAR LENS  IMPLANT, BILATERAL  2013  . CHOLECYSTECTOMY  1994  . COLONOSCOPY  last one 07-14-2015  . GOLD SEED IMPLANT N/A 04/16/2016   Procedure: GOLD SEED IMPLANT;  Surgeon: Cleon Gustin, MD;  Location: [facility];  Service: Urology;  Laterality: N/A;  . PROSTATE BIOPSY N/A 01/12/2016   Procedure: BIOPSY TRANSRECTAL ULTRASONIC PROSTATE (TUBP);  Surgeon: Cleon Gustin, MD;  Location: [facility];  Service: Urology;  Laterality: N/A;  . Sylvia    reports that he quit smoking about 40 years ago. His smoking use included Cigarettes. He has a 12.00 pack-year smoking history. He has never used smokeless tobacco. He reports that he drinks about 4.2 oz of alcohol per week . He reports that he does not use drugs. family history includes Brain cancer in his mother; Cancer in his brother; Heart disease in his father. No Known Allergies   Review of Systems  Constitutional: Negative for fatigue.  Eyes: Negative for visual disturbance.  Respiratory: Negative for cough, chest tightness and shortness of breath.   Cardiovascular: Negative for chest pain, palpitations and leg swelling.  Genitourinary: Negative for dysuria.  Skin: Positive for rash.  Neurological: Negative for dizziness, syncope, weakness, light-headedness and headaches.  Hematological: Negative for adenopathy.       Objective:   Physical Exam  Constitutional: He appears well-developed and well-nourished.  Cardiovascular: Normal rate and regular rhythm.   Pulmonary/Chest: Effort normal and breath sounds normal. No respiratory distress. He has no  wheezes. He has no rales.  Skin:  Patient has couple areas of thickened hyperkeratotic skin left zygomatic arch region. No ulceration. No nodular growth  Palmar aspect of both hands proximally has some nonspecific dryness and scaling. No pustules       Assessment:     #1 early actinic keratoses left side of face  #2 skin rash involving both hands. Suspect eczema/dermatitis.  No evidence for pustular psoriasis.  #3 prostate cancer    Plan:     -Flu vaccine given -Topicort 0.25% gel twice daily for 2 weeks maximum duration and touch base  if not improved in 2 weeks -Keep hands out of water as much as possible -Discussed risk and benefits of cryotherapy to left facial lesions. Patient consented. Both lesions are treated without difficulty  Eulas Post MD [facility] at [facility]

## 2016-04-22 NOTE — Telephone Encounter (Signed)
After speaking with pharmacist, he did not recommend any alternate for patient. I called wife and she is going to contact his insurance to see what is on his formulary that compares. Since I won't be in this afternoon will you have Tommi Rumps send this in for patient if they do call back? Thanks.

## 2016-04-22 NOTE — Telephone Encounter (Signed)
Error/njr °

## 2016-04-23 NOTE — Telephone Encounter (Signed)
I did not hear anything from patient's wife - will route to Aut[place] for further follow-up

## 2016-05-05 NOTE — Progress Notes (Signed)
  Radiation Oncology         [phone] ________________________________  Name: Eric Brock MRN: [id number]  Date: 05/06/2016  DOB: 08-23-1940  SIMULATION AND TREATMENT PLANNING NOTE    ICD-9-CM ICD-10-CM   1. Malignant neoplasm of prostate ([place]) 185 C61     DIAGNOSIS:  75 y.o. gentleman with stage T2b adenocarcinoma of the prostate with a Gleason's score of 4+3 and a PSA of 4.2  NARRATIVE:  The patient was brought to the [place].  Identity was confirmed.  All relevant records and images related to the planned course of therapy were reviewed.  The patient freely provided informed written consent to proceed with treatment after reviewing the details related to the planned course of therapy. The consent form was witnessed and verified by the simulation staff.  Then, the patient was set-up in a stable reproducible supine position for radiation therapy.  A vacuum lock pillow device was custom fabricated to position his legs in a reproducible immobilized position.  Then, I performed a urethrogram under sterile conditions to identify the prostatic apex.  CT images were obtained.  Surface markings were placed.  The CT images were loaded into the planning software.  Then the prostate target and avoidance structures including the rectum, bladder, bowel and hips were contoured.  Treatment planning then occurred.  The radiation prescription was entered and confirmed.  A total of one complex treatment devices were fabricated. I have requested : Intensity Modulated Radiotherapy (IMRT) is medically necessary for this case for the following reason:  Rectal sparing.Marland Kitchen  PLAN:  The patient will receive 78 Gy in 40 fractions.  ________________________________  Sheral Apley Tammi Klippel, M.D.

## 2016-05-06 ENCOUNTER — Ambulatory Visit
Admission: RE | Admit: 2016-05-06 | Discharge: 2016-05-06 | Disposition: A | Discharge status: Home / Self Care | Payer: Medicare Other | Source: Ambulatory Visit | Attending: Radiation Oncology | Admitting: Radiation Oncology

## 2016-05-06 DIAGNOSIS — C61 Malignant neoplasm of prostate: Secondary | ICD-10-CM | POA: Diagnosis not present

## 2016-05-06 DIAGNOSIS — Z510 Encounter for antineoplastic radiation therapy: Secondary | ICD-10-CM | POA: Diagnosis not present

## 2016-05-06 DIAGNOSIS — Z8572 Personal history of non-Hodgkin lymphomas: Secondary | ICD-10-CM | POA: Diagnosis not present

## 2016-05-06 DIAGNOSIS — Z87891 Personal history of nicotine dependence: Secondary | ICD-10-CM | POA: Diagnosis not present

## 2016-05-10 ENCOUNTER — Ambulatory Visit
Admission: RE | Admit: 2016-05-10 | Discharge: 2016-05-10 | Disposition: A | Discharge status: Home / Self Care | Payer: Medicare Other | Source: Ambulatory Visit | Attending: Radiation Oncology | Admitting: Radiation Oncology

## 2016-05-10 DIAGNOSIS — C61 Malignant neoplasm of prostate: Secondary | ICD-10-CM | POA: Insufficient documentation

## 2016-05-10 DIAGNOSIS — Z510 Encounter for antineoplastic radiation therapy: Secondary | ICD-10-CM | POA: Insufficient documentation

## 2016-05-14 DIAGNOSIS — C61 Malignant neoplasm of prostate: Secondary | ICD-10-CM | POA: Diagnosis not present

## 2016-05-14 DIAGNOSIS — Z510 Encounter for antineoplastic radiation therapy: Secondary | ICD-10-CM | POA: Diagnosis not present

## 2016-05-17 ENCOUNTER — Ambulatory Visit
Admission: RE | Admit: 2016-05-17 | Discharge: 2016-05-17 | Disposition: A | Discharge status: Home / Self Care | Payer: Medicare Other | Source: Ambulatory Visit | Attending: Radiation Oncology | Admitting: Radiation Oncology

## 2016-05-17 DIAGNOSIS — C61 Malignant neoplasm of prostate: Secondary | ICD-10-CM | POA: Diagnosis not present

## 2016-05-17 DIAGNOSIS — Z510 Encounter for antineoplastic radiation therapy: Secondary | ICD-10-CM | POA: Diagnosis not present

## 2016-05-18 ENCOUNTER — Ambulatory Visit
Admission: RE | Admit: 2016-05-18 | Discharge: 2016-05-18 | Disposition: A | Discharge status: Home / Self Care | Payer: Medicare Other | Source: Ambulatory Visit | Attending: Radiation Oncology | Admitting: Radiation Oncology

## 2016-05-18 ENCOUNTER — Encounter: Payer: Self-pay | Admitting: *Deleted

## 2016-05-18 DIAGNOSIS — C61 Malignant neoplasm of prostate: Secondary | ICD-10-CM | POA: Diagnosis not present

## 2016-05-18 DIAGNOSIS — Z510 Encounter for antineoplastic radiation therapy: Secondary | ICD-10-CM | POA: Diagnosis not present

## 2016-05-19 ENCOUNTER — Ambulatory Visit
Admission: RE | Admit: 2016-05-19 | Discharge: 2016-05-19 | Disposition: A | Discharge status: Home / Self Care | Payer: Medicare Other | Source: Ambulatory Visit | Attending: Radiation Oncology | Admitting: Radiation Oncology

## 2016-05-19 DIAGNOSIS — C61 Malignant neoplasm of prostate: Secondary | ICD-10-CM | POA: Diagnosis not present

## 2016-05-19 DIAGNOSIS — M1711 Unilateral primary osteoarthritis, right knee: Secondary | ICD-10-CM | POA: Diagnosis not present

## 2016-05-19 DIAGNOSIS — Z510 Encounter for antineoplastic radiation therapy: Secondary | ICD-10-CM | POA: Diagnosis not present

## 2016-05-20 ENCOUNTER — Encounter: Payer: Self-pay | Admitting: Medical Oncology

## 2016-05-20 ENCOUNTER — Ambulatory Visit
Admission: RE | Admit: 2016-05-20 | Discharge: 2016-05-20 | Disposition: A | Discharge status: Home / Self Care | Payer: Medicare Other | Source: Ambulatory Visit | Attending: Radiation Oncology | Admitting: Radiation Oncology

## 2016-05-20 ENCOUNTER — Encounter: Payer: Self-pay | Admitting: Radiation Oncology

## 2016-05-20 VITALS — BP 128/70 | HR 66 | Temp 98.4°F | Ht 70.5 in | Wt 185.2 lb

## 2016-05-20 DIAGNOSIS — C61 Malignant neoplasm of prostate: Secondary | ICD-10-CM

## 2016-05-20 DIAGNOSIS — Z510 Encounter for antineoplastic radiation therapy: Secondary | ICD-10-CM | POA: Diagnosis not present

## 2016-05-20 NOTE — Progress Notes (Addendum)
Eric Brock has completed 4 fractions to his prostate.  He denies having dysuria, hematuria.  He reports having daily headaches that started after radiation started and also noticed some tension in his shoulders.  He reports having nocturia 3-4 a night.  He has also noticed that it is harder to start his stream during the night after he lays down.  He reports having frequent hot flashes from his lupron shot and has been given a medication by his urologist that he has not started yet.  He reports having fatigue.  He has questions about drinking 1 glass of wine with dinner daily, he is also wondering if he has a lot of side effects after 5 weeks, if he can switch to having seeds.    BP 128/70 (BP Location: Right Arm, Patient Position: Sitting)   Pulse 66   Temp 98.4 F (36.9 C) (Oral)   Ht 5' 10.5" (1.791 m)   Wt 185 lb 3.2 oz (84 kg)   SpO2 98%   BMI 26.20 kg/m    Wt Readings from Last 3 Encounters:  05/20/16 185 lb 3.2 oz (84 kg)  04/16/16 179 lb (81.2 kg)  03/22/16 181 lb (82.1 kg)

## 2016-05-20 NOTE — Progress Notes (Signed)
  Radiation Oncology         [phone]   Name: Eric Brock MRN: [id number]   Date: 05/20/2016  DOB: May 27, 1941     Weekly Radiation Therapy Management    ICD-9-CM ICD-10-CM   1. Malignant neoplasm of prostate (HCC) 185 C61     Current Dose: 7.8 Gy  Planned Dose:  78 Gy  Narrative The patient presents for routine under treatment assessment.  Eric Brock has completed 4 fractions to his prostate.  He denies having dysuria, hematuria.  He reports having daily headaches that started after radiation started and also noticed some tension in his shoulders. He does admit he worries a lot. He reports having nocturia 3-4 a night.  He has also noticed that it is harder to start his stream during the night after he lays down.  He reports having frequent hot flashes from his lupron shot and has been given a medication by his urologist that he has not started yet.  He reports having fatigue. He has questions about drinking 1 glass of wine with dinner daily, he is also wondering if he has a lot of side effects after 5 weeks, if he can switch to having seeds.     Set-up films were reviewed. The chart was checked.  Physical Findings  height is 5' 10.5" (1.791 m) and weight is 185 lb 3.2 oz (84 kg). His oral temperature is 98.4 F (36.9 C). His blood pressure is 128/70 and his pulse is 66. His oxygen saturation is 98%. . Weight essentially stable.  No significant changes.  Impression The patient is tolerating radiation. I spoke with the patient about the logistics involved in switching to prostate seed implant after 5 weeks of external radiation versus external beam radiation only. The patient would like to proceed with external beam radiation only.   Plan Continue treatment as planned.         Sheral Apley Tammi Klippel, M.D.  This document serves as a record of services personally performed by Tyler Pita, MD. It was created on his behalf by Arlyce Harman, a trained medical scribe. The  creation of this record is based on the scribe's personal observations and the provider's statements to them. This document has been checked and approved by the attending provider.

## 2016-05-21 ENCOUNTER — Ambulatory Visit
Admission: RE | Admit: 2016-05-21 | Discharge: 2016-05-21 | Disposition: A | Discharge status: Home / Self Care | Payer: Medicare Other | Source: Ambulatory Visit | Attending: Radiation Oncology | Admitting: Radiation Oncology

## 2016-05-21 DIAGNOSIS — Z510 Encounter for antineoplastic radiation therapy: Secondary | ICD-10-CM | POA: Diagnosis not present

## 2016-05-21 DIAGNOSIS — C61 Malignant neoplasm of prostate: Secondary | ICD-10-CM | POA: Diagnosis not present

## 2016-05-24 ENCOUNTER — Ambulatory Visit
Admission: RE | Admit: 2016-05-24 | Discharge: 2016-05-24 | Disposition: A | Discharge status: Home / Self Care | Payer: Medicare Other | Source: Ambulatory Visit | Attending: Radiation Oncology | Admitting: Radiation Oncology

## 2016-05-24 DIAGNOSIS — Z510 Encounter for antineoplastic radiation therapy: Secondary | ICD-10-CM | POA: Diagnosis not present

## 2016-05-24 DIAGNOSIS — C61 Malignant neoplasm of prostate: Secondary | ICD-10-CM | POA: Diagnosis not present

## 2016-05-25 ENCOUNTER — Ambulatory Visit
Admission: RE | Admit: 2016-05-25 | Discharge: 2016-05-25 | Disposition: A | Discharge status: Home / Self Care | Payer: Medicare Other | Source: Ambulatory Visit | Attending: Radiation Oncology | Admitting: Radiation Oncology

## 2016-05-25 DIAGNOSIS — Z510 Encounter for antineoplastic radiation therapy: Secondary | ICD-10-CM | POA: Diagnosis not present

## 2016-05-25 DIAGNOSIS — C61 Malignant neoplasm of prostate: Secondary | ICD-10-CM | POA: Diagnosis not present

## 2016-05-26 ENCOUNTER — Ambulatory Visit
Admission: RE | Admit: 2016-05-26 | Discharge: 2016-05-26 | Disposition: A | Discharge status: Home / Self Care | Payer: Medicare Other | Source: Ambulatory Visit | Attending: Radiation Oncology | Admitting: Radiation Oncology

## 2016-05-26 DIAGNOSIS — C61 Malignant neoplasm of prostate: Secondary | ICD-10-CM | POA: Diagnosis not present

## 2016-05-26 DIAGNOSIS — Z510 Encounter for antineoplastic radiation therapy: Secondary | ICD-10-CM | POA: Diagnosis not present

## 2016-05-27 ENCOUNTER — Ambulatory Visit
Admission: RE | Admit: 2016-05-27 | Discharge: 2016-05-27 | Disposition: A | Discharge status: Home / Self Care | Payer: Medicare Other | Source: Ambulatory Visit | Attending: Radiation Oncology | Admitting: Radiation Oncology

## 2016-05-27 DIAGNOSIS — Z510 Encounter for antineoplastic radiation therapy: Secondary | ICD-10-CM | POA: Diagnosis not present

## 2016-05-27 DIAGNOSIS — C61 Malignant neoplasm of prostate: Secondary | ICD-10-CM | POA: Diagnosis not present

## 2016-05-28 ENCOUNTER — Encounter: Payer: Self-pay | Admitting: Radiation Oncology

## 2016-05-28 ENCOUNTER — Ambulatory Visit
Admission: RE | Admit: 2016-05-28 | Discharge: 2016-05-28 | Disposition: A | Discharge status: Home / Self Care | Payer: Medicare Other | Source: Ambulatory Visit | Attending: Radiation Oncology | Admitting: Radiation Oncology

## 2016-05-28 VITALS — BP 113/98 | HR 73 | Resp 16 | Wt 182.8 lb

## 2016-05-28 DIAGNOSIS — Z510 Encounter for antineoplastic radiation therapy: Secondary | ICD-10-CM | POA: Diagnosis not present

## 2016-05-28 DIAGNOSIS — C61 Malignant neoplasm of prostate: Secondary | ICD-10-CM

## 2016-05-28 NOTE — Progress Notes (Signed)
  Radiation Oncology         [phone]   Name: Eric Brock MRN: [id number]   Date: 05/28/2016  DOB: 11-04-1940     Weekly Radiation Therapy Management    ICD-9-CM ICD-10-CM   1. Malignant neoplasm of prostate (HCC) 185 C61     Current Dose: 19.5 Gy  Planned Dose:  78 Gy  Narrative The patient presents for routine under treatment assessment.  Weight and vitals stable. The patient denies pain. He reports continued hot flashes, but not bothersome enough to begin taking Megace. He denies dysuria or hematuria. The patient reports nocturia x 3-4. He explains that, on occasion, during the night he will have hesitancy with little urine output, but mostly he has a strong, steady urine stream. The patient denies urinary urgency, frequency, leakage, or incontinence. He reports fatigue. He also reports some anxiety before radiation treatments, but does not feel it warrants intervention at this time.  Set-up films were reviewed. The chart was checked.  Physical Findings  weight is 182 lb 12.8 oz (82.9 kg). His blood pressure is 113/98 (abnormal) and his pulse is 73. His respiration is 16 and oxygen saturation is 100%. . Weight essentially stable.  No significant changes.  Impression The patient is tolerating radiation.  Plan Continue treatment as planned.         Sheral Apley Tammi Klippel, M.D.  This document serves as a record of services personally performed by Tyler Pita, MD. It was created on his behalf by Maryla Morrow, a trained medical scribe. The creation of this record is based on the scribe's personal observations and the provider's statements to them. This document has been checked and approved by the attending provider.

## 2016-05-28 NOTE — Progress Notes (Signed)
Weight and vitals stable. Denies pain. Reports continued hot flashes but, not bothersome enough to begin taking Megace. Denies dysuria or hematuria. Reports nocturia x 3-4. Explains on occasion during the night he will have hesitancy with little urine output but, mostly he has a strong steady urine stream. Denies urinary urgency, frequency, leakage or incontinence. Reports fatigue.  BP (!) 113/98 (BP Location: Left Arm, Patient Position: Sitting, Cuff Size: Normal)   Pulse 73   Resp 16   Wt 182 lb 12.8 oz (82.9 kg)   SpO2 100%   BMI 25.86 kg/m  Wt Readings from Last 3 Encounters:  05/28/16 182 lb 12.8 oz (82.9 kg)  05/20/16 185 lb 3.2 oz (84 kg)  04/16/16 179 lb (81.2 kg)

## 2016-05-31 ENCOUNTER — Ambulatory Visit
Admission: RE | Admit: 2016-05-31 | Discharge: 2016-05-31 | Disposition: A | Discharge status: Home / Self Care | Payer: Medicare Other | Source: Ambulatory Visit | Attending: Radiation Oncology | Admitting: Radiation Oncology

## 2016-05-31 DIAGNOSIS — C61 Malignant neoplasm of prostate: Secondary | ICD-10-CM | POA: Diagnosis not present

## 2016-05-31 DIAGNOSIS — Z510 Encounter for antineoplastic radiation therapy: Secondary | ICD-10-CM | POA: Diagnosis not present

## 2016-06-01 ENCOUNTER — Ambulatory Visit
Admission: RE | Admit: 2016-06-01 | Discharge: 2016-06-01 | Disposition: A | Discharge status: Home / Self Care | Payer: Medicare Other | Source: Ambulatory Visit | Attending: Radiation Oncology | Admitting: Radiation Oncology

## 2016-06-01 VITALS — BP 120/72 | HR 66 | Resp 16 | Wt 182.0 lb

## 2016-06-01 DIAGNOSIS — C61 Malignant neoplasm of prostate: Secondary | ICD-10-CM

## 2016-06-01 DIAGNOSIS — Z510 Encounter for antineoplastic radiation therapy: Secondary | ICD-10-CM | POA: Diagnosis not present

## 2016-06-01 NOTE — Progress Notes (Addendum)
Weight and vitals stable. Denies pain. Reports continued hot flashes but, not bothersome enough to begin taking Megace. Denies dysuria or hematuria. Reports nocturia x 3-4.  Denies urinary frequency, leakage or incontinence. Reports occasional urgency. Reports bowels are loose but, denies diarrhea.Reports fatigue.  BP 120/72   Pulse 66   Resp 16   Wt 182 lb (82.6 kg)   SpO2 100%   BMI 25.75 kg/m  Wt Readings from Last 3 Encounters:  06/01/16 182 lb (82.6 kg)  05/28/16 182 lb 12.8 oz (82.9 kg)  05/20/16 185 lb 3.2 oz (84 kg)

## 2016-06-01 NOTE — Progress Notes (Signed)
  Radiation Oncology         [phone]   Name: Eric Brock MRN: [id number]   Date: 06/01/2016  DOB: 1941/06/20   Weekly Radiation Therapy Management    ICD-9-CM ICD-10-CM   1. Malignant neoplasm of prostate (H[facility]) 185 C61     Current Dose: 23.4 Gy  Planned Dose:  78 Gy  Narrative The patient presents for routine under treatment assessment. Weight and vitals stable. Denies pain. Reports continued hot flashes but, not bothersome enough to begin taking Megace. Denies dysuria or hematuria. Reports nocturia x 3-4.  Denies urinary frequency, leakage or incontinence. Reports occasional urgency. Reports bowels are loose but, denies diarrhea.Reports fatigue.  .  The patient is without complaint. Set-up films were reviewed. The chart was checked.  Physical Findings  weight is 182 lb (82.6 kg). His blood pressure is 120/72 and his pulse is 66. His respiration is 16 and oxygen saturation is 100%. . Weight essentially stable.  No significant changes.  Impression The patient is tolerating radiation.  Plan Continue treatment as planned.         Sheral Apley Tammi Klippel, M.D.

## 2016-06-02 ENCOUNTER — Telehealth: Payer: Self-pay | Admitting: Radiation Oncology

## 2016-06-02 ENCOUNTER — Ambulatory Visit
Admission: RE | Admit: 2016-06-02 | Discharge: 2016-06-02 | Disposition: A | Discharge status: Home / Self Care | Payer: Medicare Other | Source: Ambulatory Visit | Attending: Radiation Oncology | Admitting: Radiation Oncology

## 2016-06-02 DIAGNOSIS — C61 Malignant neoplasm of prostate: Secondary | ICD-10-CM | POA: Diagnosis not present

## 2016-06-02 DIAGNOSIS — Z510 Encounter for antineoplastic radiation therapy: Secondary | ICD-10-CM | POA: Diagnosis not present

## 2016-06-02 NOTE — Telephone Encounter (Addendum)
I spoke with the patient to discuss his urinary frequency of q2 hours and that he could increase to .4mg  BID rather than his current daily dose of flomax. Precautions were given for this off label change. He will keep [place] informed of frequency he is taking this and we will continue to follow this along during his treatment.

## 2016-06-03 ENCOUNTER — Ambulatory Visit
Admission: RE | Admit: 2016-06-03 | Discharge: 2016-06-03 | Disposition: A | Discharge status: Home / Self Care | Payer: Medicare Other | Source: Ambulatory Visit | Attending: Radiation Oncology | Admitting: Radiation Oncology

## 2016-06-03 DIAGNOSIS — Z510 Encounter for antineoplastic radiation therapy: Secondary | ICD-10-CM | POA: Diagnosis not present

## 2016-06-03 DIAGNOSIS — C61 Malignant neoplasm of prostate: Secondary | ICD-10-CM | POA: Diagnosis not present

## 2016-06-04 ENCOUNTER — Ambulatory Visit
Admission: RE | Admit: 2016-06-04 | Discharge: 2016-06-04 | Disposition: A | Discharge status: Home / Self Care | Payer: Medicare Other | Source: Ambulatory Visit | Attending: Radiation Oncology | Admitting: Radiation Oncology

## 2016-06-04 DIAGNOSIS — C61 Malignant neoplasm of prostate: Secondary | ICD-10-CM | POA: Diagnosis not present

## 2016-06-04 DIAGNOSIS — Z510 Encounter for antineoplastic radiation therapy: Secondary | ICD-10-CM | POA: Diagnosis not present

## 2016-06-07 ENCOUNTER — Encounter: Payer: Self-pay | Admitting: Medical Oncology

## 2016-06-07 ENCOUNTER — Ambulatory Visit
Admission: RE | Admit: 2016-06-07 | Discharge: 2016-06-07 | Disposition: A | Discharge status: Home / Self Care | Payer: Medicare Other | Source: Ambulatory Visit | Attending: Radiation Oncology | Admitting: Radiation Oncology

## 2016-06-07 DIAGNOSIS — Z510 Encounter for antineoplastic radiation therapy: Secondary | ICD-10-CM | POA: Diagnosis not present

## 2016-06-07 DIAGNOSIS — C61 Malignant neoplasm of prostate: Secondary | ICD-10-CM | POA: Diagnosis not present

## 2016-06-08 ENCOUNTER — Ambulatory Visit
Admission: RE | Admit: 2016-06-08 | Discharge: 2016-06-08 | Disposition: A | Discharge status: Home / Self Care | Payer: Medicare Other | Source: Ambulatory Visit | Attending: Radiation Oncology | Admitting: Radiation Oncology

## 2016-06-08 DIAGNOSIS — Z510 Encounter for antineoplastic radiation therapy: Secondary | ICD-10-CM | POA: Diagnosis not present

## 2016-06-08 DIAGNOSIS — C61 Malignant neoplasm of prostate: Secondary | ICD-10-CM | POA: Diagnosis not present

## 2016-06-09 ENCOUNTER — Ambulatory Visit
Admission: RE | Admit: 2016-06-09 | Discharge: 2016-06-09 | Disposition: A | Discharge status: Home / Self Care | Payer: Medicare Other | Source: Ambulatory Visit | Attending: Radiation Oncology | Admitting: Radiation Oncology

## 2016-06-09 DIAGNOSIS — Z510 Encounter for antineoplastic radiation therapy: Secondary | ICD-10-CM | POA: Diagnosis not present

## 2016-06-09 DIAGNOSIS — C61 Malignant neoplasm of prostate: Secondary | ICD-10-CM | POA: Diagnosis not present

## 2016-06-10 ENCOUNTER — Ambulatory Visit
Admission: RE | Admit: 2016-06-10 | Discharge: 2016-06-10 | Disposition: A | Discharge status: Home / Self Care | Payer: Medicare Other | Source: Ambulatory Visit | Attending: Radiation Oncology | Admitting: Radiation Oncology

## 2016-06-10 ENCOUNTER — Encounter: Payer: Self-pay | Admitting: Radiation Oncology

## 2016-06-10 VITALS — BP 130/77 | HR 64 | Temp 98.2°F | Resp 18 | Ht 70.5 in | Wt 182.0 lb

## 2016-06-10 DIAGNOSIS — C61 Malignant neoplasm of prostate: Secondary | ICD-10-CM | POA: Diagnosis not present

## 2016-06-10 DIAGNOSIS — Z510 Encounter for antineoplastic radiation therapy: Secondary | ICD-10-CM | POA: Diagnosis not present

## 2016-06-10 NOTE — Progress Notes (Signed)
  Radiation Oncology         [phone]   Name: Eric Brock MRN: [id number]   Date: 06/10/2016  DOB: 19-Jan-1941     Weekly Radiation Therapy Management    ICD-9-CM ICD-10-CM   1. Malignant neoplasm of prostate (HCC) 185 C61     Current Dose: 34.2 Gy  Planned Dose:  78 Gy  Narrative The patient presents for routine under treatment assessment.  Weight and vitals stable. Denies pain. Reports continued hot flashes but, not bothersome enough to being taking Megace. Denies dysuria or hematuria. Reports nocturia x4. Having increased urinary frequency. Reports urinary hesitation before beginning urinary stream after drinking wine. Denies leakage or incontinence. Reports occasional urgency. Reports bowel movements loose stools have improved this week. Denies diarrhea. Reports fatigue. He was able to play racquet ball yesterday and plans to increase physical activity.  Set-up films were reviewed. The chart was checked.  Physical Findings  height is 5' 10.5" (1.791 m) and weight is 182 lb (82.6 kg). His oral temperature is 98.2 F (36.8 C). His blood pressure is 130/77 and his pulse is 64. His respiration is 18 and oxygen saturation is 100%. . Weight essentially stable.  No significant changes.  Impression The patient is tolerating radiation.  Plan Continue treatment as planned.         Sheral Apley Tammi Klippel, M.D.  This document serves as a record of services personally performed by Tyler Pita, MD. It was created on his behalf by Arlyce Harman, a trained medical scribe. The creation of this record is based on the scribe's personal observations and the provider's statements to them. This document has been checked and approved by the attending provider.

## 2016-06-10 NOTE — Progress Notes (Signed)
Weight and vitals stable. Denies pain. Reports continued hot flashes but, not bothersome enough to begin taking Megace. Denies dysuria or hematuria. Reports nocturia x 4.  Having urinary frequency. Denies leakage or incontinence. Reports occasional urgency. Reports bowel movements loose stools have improved this week , denies diarrhea.Reports fatigue. Wt Readings from Last 3 Encounters:  06/10/16 182 lb (82.6 kg)  06/01/16 182 lb (82.6 kg)  05/28/16 182 lb 12.8 oz (82.9 kg)  BP 130/77   Pulse 64   Temp 98.2 F (36.8 C) (Oral)   Resp 18   Ht 5' 10.5" (1.791 m)   Wt 182 lb (82.6 kg)   SpO2 100%   BMI 25.75 kg/m

## 2016-06-11 ENCOUNTER — Ambulatory Visit
Admission: RE | Admit: 2016-06-11 | Discharge: 2016-06-11 | Disposition: A | Discharge status: Home / Self Care | Payer: Medicare Other | Source: Ambulatory Visit | Attending: Radiation Oncology | Admitting: Radiation Oncology

## 2016-06-11 DIAGNOSIS — C61 Malignant neoplasm of prostate: Secondary | ICD-10-CM | POA: Diagnosis not present

## 2016-06-11 DIAGNOSIS — Z510 Encounter for antineoplastic radiation therapy: Secondary | ICD-10-CM | POA: Diagnosis not present

## 2016-06-11 NOTE — Addendum Note (Signed)
Encounter addended by: Malena Edman, RN on: 06/11/2016  2:46 PM<BR>    Actions taken: Order Reconciliation Section accessed, Home Medications modified, Medication taking status modified

## 2016-06-14 ENCOUNTER — Ambulatory Visit
Admission: RE | Admit: 2016-06-14 | Discharge: 2016-06-14 | Disposition: A | Discharge status: Home / Self Care | Payer: Medicare Other | Source: Ambulatory Visit | Attending: Radiation Oncology | Admitting: Radiation Oncology

## 2016-06-14 DIAGNOSIS — Z510 Encounter for antineoplastic radiation therapy: Secondary | ICD-10-CM | POA: Diagnosis not present

## 2016-06-14 DIAGNOSIS — C61 Malignant neoplasm of prostate: Secondary | ICD-10-CM | POA: Diagnosis not present

## 2016-06-15 ENCOUNTER — Ambulatory Visit
Admission: RE | Admit: 2016-06-15 | Discharge: 2016-06-15 | Disposition: A | Discharge status: Home / Self Care | Payer: Medicare Other | Source: Ambulatory Visit | Attending: Radiation Oncology | Admitting: Radiation Oncology

## 2016-06-15 DIAGNOSIS — Z510 Encounter for antineoplastic radiation therapy: Secondary | ICD-10-CM | POA: Diagnosis not present

## 2016-06-15 DIAGNOSIS — C61 Malignant neoplasm of prostate: Secondary | ICD-10-CM | POA: Diagnosis not present

## 2016-06-16 ENCOUNTER — Ambulatory Visit
Admission: RE | Admit: 2016-06-16 | Discharge: 2016-06-16 | Disposition: A | Discharge status: Home / Self Care | Payer: Medicare Other | Source: Ambulatory Visit | Attending: Radiation Oncology | Admitting: Radiation Oncology

## 2016-06-16 DIAGNOSIS — Z510 Encounter for antineoplastic radiation therapy: Secondary | ICD-10-CM | POA: Diagnosis not present

## 2016-06-16 DIAGNOSIS — C61 Malignant neoplasm of prostate: Secondary | ICD-10-CM | POA: Diagnosis not present

## 2016-06-17 ENCOUNTER — Ambulatory Visit
Admission: RE | Admit: 2016-06-17 | Discharge: 2016-06-17 | Disposition: A | Discharge status: Home / Self Care | Payer: Medicare Other | Source: Ambulatory Visit | Attending: Radiation Oncology | Admitting: Radiation Oncology

## 2016-06-17 ENCOUNTER — Encounter: Payer: Self-pay | Admitting: Radiation Oncology

## 2016-06-17 VITALS — BP 122/74 | HR 65 | Temp 98.3°F | Resp 20 | Wt 180.6 lb

## 2016-06-17 DIAGNOSIS — Z510 Encounter for antineoplastic radiation therapy: Secondary | ICD-10-CM | POA: Diagnosis not present

## 2016-06-17 DIAGNOSIS — C61 Malignant neoplasm of prostate: Secondary | ICD-10-CM

## 2016-06-17 MED ORDER — LORAZEPAM 0.5 MG PO TABS: ORAL_TABLET | ORAL | 0 refills | Status: DC

## 2016-06-17 NOTE — Progress Notes (Signed)
  Radiation Oncology         [phone]   Name: Eric Brock MRN: [id number]   Date: 06/17/2016  DOB: 1941-04-01     Weekly Radiation Therapy Management    ICD-9-CM ICD-10-CM   1. Malignant neoplasm of prostate (HCC) 185 C61     Current Dose: 46.8 Gy  Planned Dose:  78 Gy  Narrative The patient presents for routine under treatment assessment.  Prostate 24/40 completed. Reports ncreased urinary frequency, slow stream, nausea, anxiety, and nocturia x3-4. He is up at night every 2 hours to void. Reports a poor appetite, hot flashes, mild fatigue, and tries to stay active. The patient is on Flomax 0.4 mg once daily.  Set-up films were reviewed. The chart was checked.  Physical Findings  weight is 180 lb 9.6 oz (81.9 kg). His oral temperature is 98.3 F (36.8 C). His blood pressure is 122/74 and his pulse is 65. His respiration is 20.   Weight essentially stable.  No significant changes.  Impression The patient is tolerating radiation.  Plan Continue treatment as planned. Perkins, [place] advised the patient that he could take 2 of the Flomax at night for his nocturia. Perkins, [place] advised the patient that he could start using ginger or ginger ale for his nausea, but if it does not improve then we could give him medication. She will also prescribe Ativan for the patient's anxiety.         Sheral Apley Tammi Klippel, M.D.  This document serves as a record of services personally performed by Shona Simpson, PA-C and Tyler Pita, MD. It was created on their behalf by Darcus Austin, a trained medical scribe. The creation of this record is based on the scribe's personal observations and the providers' statements to them. This document has been checked and approved by the attending provider.

## 2016-06-17 NOTE — Progress Notes (Signed)
Weekly rad txs  Prostate 24/40 completed, increased frequency, slow stream, and nocturia 3-4x,  Up at night every 2 hoursd stated to void, no appetite good, mild fatigue, but stas active, plays racketball,yard work 8:56 AM BP 122/74 (BP Location: Right Arm, Patient Position: Sitting, Cuff Size: Normal)   Pulse 65   Temp 98.3 F (36.8 C) (Oral)   Resp 20   Wt 180 lb 9.6 oz (81.9 kg)   BMI 25.55 kg/m   Wt Readings from Last 3 Encounters:  06/17/16 180 lb 9.6 oz (81.9 kg)  06/10/16 182 lb (82.6 kg)  06/01/16 182 lb (82.6 kg)

## 2016-06-18 ENCOUNTER — Ambulatory Visit: Payer: Medicare Other | Admitting: Radiation Oncology

## 2016-06-18 ENCOUNTER — Ambulatory Visit
Admission: RE | Admit: 2016-06-18 | Discharge: 2016-06-18 | Disposition: A | Discharge status: Home / Self Care | Payer: Medicare Other | Source: Ambulatory Visit | Attending: Radiation Oncology | Admitting: Radiation Oncology

## 2016-06-18 DIAGNOSIS — C61 Malignant neoplasm of prostate: Secondary | ICD-10-CM | POA: Diagnosis not present

## 2016-06-18 DIAGNOSIS — Z510 Encounter for antineoplastic radiation therapy: Secondary | ICD-10-CM | POA: Diagnosis not present

## 2016-06-20 ENCOUNTER — Ambulatory Visit
Admission: RE | Admit: 2016-06-20 | Discharge: 2016-06-20 | Disposition: A | Discharge status: Home / Self Care | Payer: Medicare Other | Source: Ambulatory Visit | Attending: Radiation Oncology | Admitting: Radiation Oncology

## 2016-06-20 ENCOUNTER — Encounter: Payer: Self-pay | Admitting: Radiation Oncology

## 2016-06-20 VITALS — BP 122/76 | HR 64 | Temp 98.0°F | Ht 70.5 in | Wt 181.6 lb

## 2016-06-20 DIAGNOSIS — C61 Malignant neoplasm of prostate: Secondary | ICD-10-CM | POA: Diagnosis not present

## 2016-06-20 DIAGNOSIS — Z510 Encounter for antineoplastic radiation therapy: Secondary | ICD-10-CM | POA: Diagnosis not present

## 2016-06-20 NOTE — Progress Notes (Signed)
  Radiation Oncology         [phone]   Name: Eric Brock MRN: [id number]   Date: 06/20/2016  DOB: 1941-05-25     Weekly Radiation Therapy Management    ICD-9-CM ICD-10-CM   1. Malignant neoplasm of prostate ([facility]) 185 C61     Current Dose: 50.7 Gy  Planned Dose:  78 Gy  Narrative The patient presents for routine under treatment assessment.  Eric Brock presents for his 26th fraction of radiation to his Prostate. He denies pain, frequency/urgency during the day, dysuria, or hematuria. He reports mild fatigue, some hesitancy, nocturia every 2 hours, and normal bowel movements. The patient is on Flomax.  Set-up films were reviewed. The chart was checked.  Physical Findings  height is 5' 10.5" (1.791 m) and weight is 181 lb 9.6 oz (82.4 kg). His temperature is 98 F (36.7 C). His blood pressure is 122/76 and his pulse is 64. His oxygen saturation is 97%.   Weight essentially stable.  No significant changes.  Impression The patient is tolerating radiation.  Plan Continue treatment as planned.         Sheral Apley Tammi Klippel, M.D.  This document serves as a record of services personally performed by Tyler Pita, MD. It was created on his behalf by Darcus Austin, a trained medical scribe. The creation of this record is based on the scribe's personal observations and the provider's statements to them. This document has been checked and approved by the attending provider.

## 2016-06-20 NOTE — Progress Notes (Signed)
Mr. Eric Brock presents for his 26th fraction of radiation to his Prostate. He denies pain. He does have mild fatigue. He denies burning or hematuria with urination. He is getting up about every 2 hours during the night to urinate. He denies frequency or urgency during the day. He is having normal daily bowel movements. He has no other concerns at this time.   BP 122/76   Pulse 64   Temp 98 F (36.7 C)   Ht 5' 10.5" (1.791 m)   Wt 181 lb 9.6 oz (82.4 kg)   SpO2 97% Comment: room air  BMI 25.69 kg/m    Wt Readings from Last 3 Encounters:  06/20/16 181 lb 9.6 oz (82.4 kg)  06/17/16 180 lb 9.6 oz (81.9 kg)  06/10/16 182 lb (82.6 kg)

## 2016-06-21 ENCOUNTER — Ambulatory Visit
Admission: RE | Admit: 2016-06-21 | Discharge: 2016-06-21 | Disposition: A | Discharge status: Home / Self Care | Payer: Medicare Other | Source: Ambulatory Visit | Attending: Radiation Oncology | Admitting: Radiation Oncology

## 2016-06-21 DIAGNOSIS — Z510 Encounter for antineoplastic radiation therapy: Secondary | ICD-10-CM | POA: Diagnosis not present

## 2016-06-21 DIAGNOSIS — C61 Malignant neoplasm of prostate: Secondary | ICD-10-CM | POA: Diagnosis not present

## 2016-06-22 ENCOUNTER — Ambulatory Visit
Admission: RE | Admit: 2016-06-22 | Discharge: 2016-06-22 | Disposition: A | Discharge status: Home / Self Care | Payer: Medicare Other | Source: Ambulatory Visit | Attending: Radiation Oncology | Admitting: Radiation Oncology

## 2016-06-22 DIAGNOSIS — Z510 Encounter for antineoplastic radiation therapy: Secondary | ICD-10-CM | POA: Diagnosis not present

## 2016-06-22 DIAGNOSIS — C61 Malignant neoplasm of prostate: Secondary | ICD-10-CM | POA: Diagnosis not present

## 2016-06-23 ENCOUNTER — Ambulatory Visit
Admission: RE | Admit: 2016-06-23 | Discharge: 2016-06-23 | Disposition: A | Discharge status: Home / Self Care | Payer: Medicare Other | Source: Ambulatory Visit | Attending: Radiation Oncology | Admitting: Radiation Oncology

## 2016-06-23 DIAGNOSIS — Z510 Encounter for antineoplastic radiation therapy: Secondary | ICD-10-CM | POA: Diagnosis not present

## 2016-06-23 DIAGNOSIS — C61 Malignant neoplasm of prostate: Secondary | ICD-10-CM | POA: Diagnosis not present

## 2016-06-25 ENCOUNTER — Ambulatory Visit: Payer: Medicare Other

## 2016-06-28 ENCOUNTER — Ambulatory Visit
Admission: RE | Admit: 2016-06-28 | Discharge: 2016-06-28 | Disposition: A | Discharge status: Home / Self Care | Payer: Medicare Other | Source: Ambulatory Visit | Attending: Radiation Oncology | Admitting: Radiation Oncology

## 2016-06-28 DIAGNOSIS — Z510 Encounter for antineoplastic radiation therapy: Secondary | ICD-10-CM | POA: Diagnosis not present

## 2016-06-28 DIAGNOSIS — C61 Malignant neoplasm of prostate: Secondary | ICD-10-CM | POA: Diagnosis not present

## 2016-06-29 ENCOUNTER — Ambulatory Visit
Admission: RE | Admit: 2016-06-29 | Discharge: 2016-06-29 | Disposition: A | Discharge status: Home / Self Care | Payer: Medicare Other | Source: Ambulatory Visit | Attending: Radiation Oncology | Admitting: Radiation Oncology

## 2016-06-29 DIAGNOSIS — C61 Malignant neoplasm of prostate: Secondary | ICD-10-CM | POA: Diagnosis not present

## 2016-06-29 DIAGNOSIS — Z510 Encounter for antineoplastic radiation therapy: Secondary | ICD-10-CM | POA: Diagnosis not present

## 2016-06-30 ENCOUNTER — Ambulatory Visit
Admission: RE | Admit: 2016-06-30 | Discharge: 2016-06-30 | Disposition: A | Discharge status: Home / Self Care | Payer: Medicare Other | Source: Ambulatory Visit | Attending: Radiation Oncology | Admitting: Radiation Oncology

## 2016-06-30 DIAGNOSIS — Z510 Encounter for antineoplastic radiation therapy: Secondary | ICD-10-CM | POA: Diagnosis not present

## 2016-06-30 DIAGNOSIS — C61 Malignant neoplasm of prostate: Secondary | ICD-10-CM | POA: Diagnosis not present

## 2016-07-01 ENCOUNTER — Ambulatory Visit
Admission: RE | Admit: 2016-07-01 | Discharge: 2016-07-01 | Disposition: A | Discharge status: Home / Self Care | Payer: Medicare Other | Source: Ambulatory Visit | Attending: Radiation Oncology | Admitting: Radiation Oncology

## 2016-07-01 VITALS — BP 140/84 | HR 71 | Resp 16 | Wt 181.4 lb

## 2016-07-01 DIAGNOSIS — C61 Malignant neoplasm of prostate: Secondary | ICD-10-CM

## 2016-07-01 DIAGNOSIS — Z510 Encounter for antineoplastic radiation therapy: Secondary | ICD-10-CM | POA: Diagnosis not present

## 2016-07-01 NOTE — Progress Notes (Signed)
  Radiation Oncology         [phone]   Name: Eric Brock MRN: [id number]   Date: 07/01/2016  DOB: [birth year]     Weekly Radiation Therapy Management    ICD-9-CM ICD-10-CM   1. Malignant neoplasm of prostate ([facility]CC) 185 C61     Current Dose: 59.4 Gy  Planned Dose:  78 Gy  Narrative The patient presents for routine under treatment assessment.  Weight and vitals stable. Reports intermittent new onset of dysuria. Denies hematuria. Reports nocturia x 3. Reports increased frequency and urgency. Denies leakage or incontinence. Reports intermittent urinary hesitancy. Denies diarrhea or any other bowel complaints.   Set-up films were reviewed. The chart was checked.  Physical Findings  weight is 181 lb 6.4 oz (82.3 kg). His blood pressure is 140/84 and his pulse is 71. His respiration is 16 and oxygen saturation is 100%.   Weight essentially stable.  No significant changes. Alert, in no acute distress.   Impression The patient is tolerating radiation. We discussed using Azo or Pyridium to resolve dysuria. The patient is not interested in a prescription at this time but will call our clinic if symptom worsens.  Plan Continue treatment as planned.         Sheral Apley Tammi Klippel, M.D.  This document serves as a record of services personally performed by Tyler Pita, MD. It was created on his behalf by Arlyce Harman, a trained medical scribe. The creation of this record is based on the scribe's personal observations and the provider's statements to them. This document has been checked and approved by the attending provider.

## 2016-07-01 NOTE — Progress Notes (Signed)
Weight and vitals stable. Denies pain. Reports moderate fatigue. Reports intermittent new onset of dysuria. Denies hematuria. Reports nocturia x 3. Reports increased frequency and urgency. Denies leakage or incontinence. Reports intermittent urinary hesitancy. Denies diarrhea or any other bowel complaints.   BP 140/84 (BP Location: Left Arm, Patient Position: Sitting, Cuff Size: Normal)   Pulse 71   Resp 16   Wt 181 lb 6.4 oz (82.3 kg)   SpO2 100%   BMI 25.66 kg/m  Wt Readings from Last 3 Encounters:  07/01/16 181 lb 6.4 oz (82.3 kg)  06/20/16 181 lb 9.6 oz (82.4 kg)  06/17/16 180 lb 9.6 oz (81.9 kg)

## 2016-07-02 ENCOUNTER — Ambulatory Visit
Admission: RE | Admit: 2016-07-02 | Discharge: 2016-07-02 | Disposition: A | Discharge status: Home / Self Care | Payer: Medicare Other | Source: Ambulatory Visit | Attending: Radiation Oncology | Admitting: Radiation Oncology

## 2016-07-02 DIAGNOSIS — Z510 Encounter for antineoplastic radiation therapy: Secondary | ICD-10-CM | POA: Diagnosis not present

## 2016-07-02 DIAGNOSIS — C61 Malignant neoplasm of prostate: Secondary | ICD-10-CM | POA: Diagnosis not present

## 2016-07-05 ENCOUNTER — Ambulatory Visit
Admission: RE | Admit: 2016-07-05 | Discharge: 2016-07-05 | Disposition: A | Discharge status: Home / Self Care | Payer: Medicare Other | Source: Ambulatory Visit | Attending: Radiation Oncology | Admitting: Radiation Oncology

## 2016-07-05 DIAGNOSIS — C61 Malignant neoplasm of prostate: Secondary | ICD-10-CM | POA: Diagnosis not present

## 2016-07-05 DIAGNOSIS — Z510 Encounter for antineoplastic radiation therapy: Secondary | ICD-10-CM | POA: Diagnosis not present

## 2016-07-06 ENCOUNTER — Ambulatory Visit
Admission: RE | Admit: 2016-07-06 | Discharge: 2016-07-06 | Disposition: A | Discharge status: Home / Self Care | Payer: Medicare Other | Source: Ambulatory Visit | Attending: Radiation Oncology | Admitting: Radiation Oncology

## 2016-07-06 DIAGNOSIS — C61 Malignant neoplasm of prostate: Secondary | ICD-10-CM | POA: Diagnosis not present

## 2016-07-06 DIAGNOSIS — Z510 Encounter for antineoplastic radiation therapy: Secondary | ICD-10-CM | POA: Diagnosis not present

## 2016-07-07 ENCOUNTER — Ambulatory Visit
Admission: RE | Admit: 2016-07-07 | Discharge: 2016-07-07 | Disposition: A | Discharge status: Home / Self Care | Payer: Medicare Other | Source: Ambulatory Visit | Attending: Radiation Oncology | Admitting: Radiation Oncology

## 2016-07-07 DIAGNOSIS — C61 Malignant neoplasm of prostate: Secondary | ICD-10-CM | POA: Diagnosis not present

## 2016-07-07 DIAGNOSIS — Z510 Encounter for antineoplastic radiation therapy: Secondary | ICD-10-CM | POA: Diagnosis not present

## 2016-07-08 ENCOUNTER — Ambulatory Visit
Admission: RE | Admit: 2016-07-08 | Discharge: 2016-07-08 | Disposition: A | Discharge status: Home / Self Care | Payer: Medicare Other | Source: Ambulatory Visit | Attending: Radiation Oncology | Admitting: Radiation Oncology

## 2016-07-08 VITALS — BP 138/77 | HR 69 | Resp 16 | Wt 183.2 lb

## 2016-07-08 DIAGNOSIS — C61 Malignant neoplasm of prostate: Secondary | ICD-10-CM

## 2016-07-08 DIAGNOSIS — Z510 Encounter for antineoplastic radiation therapy: Secondary | ICD-10-CM | POA: Diagnosis not present

## 2016-07-08 NOTE — Progress Notes (Signed)
We are completing radiation and discussed ADT duration today.  Since we are in the intermediate risk group, I would favor 4-6 months.  However, given the number of positive core biopsies, there has been some discussion about treating like high risk and doing long term ADT for 2 years.  I'll share my recommendation with urology and defer to them regarding length of ADT.

## 2016-07-08 NOTE — Progress Notes (Signed)
Patient questions if Dr. Tammi Klippel has had a chance to discuss hormone therapy with Dr. Alyson Ingles.   Weight and vitals stable. Denies pain. Reports mild dysuria. Denies hematuria. Reports nausea. Reports nocturia x 4-5. Reports increased urgency and frequency during the day. Denies leakage or incontinence. Denies diarrhea. Reports moderate fatigue. Scheduled to follow up with Dr. Alyson Ingles in March. One month follow up appointment card given.   BP 138/77 (BP Location: Left Arm, Patient Position: Sitting, Cuff Size: Normal)   Pulse 69   Resp 16   Wt 183 lb 3.2 oz (83.1 kg)   SpO2 100%   BMI 25.91 kg/m  Wt Readings from Last 3 Encounters:  07/08/16 183 lb 3.2 oz (83.1 kg)  07/01/16 181 lb 6.4 oz (82.3 kg)  06/20/16 181 lb 9.6 oz (82.4 kg)

## 2016-07-08 NOTE — Progress Notes (Signed)
  Radiation Oncology         [phone]   Name: Eric Brock MRN: [id number]   Date: 07/08/2016  DOB: 1940-11-25     Weekly Radiation Therapy Management    ICD-9-CM ICD-10-CM   1. Malignant neoplasm of prostate (HCC) 185 C61     Current Dose: 74.1 Gy  Planned Dose:  78 Gy  Narrative The patient presents for routine under treatment assessment.  Weight and vitals stable. Denies pain. Reports mild dysuria. Denies hematuria. Reports nausea. Reports nocturia x 4-5. Reports increased urgency and frequency during the day. Denies leakage or incontinence. Denies diarrhea. Reports moderate fatigue. Scheduled to follow up with Dr. Alyson Ingles in March. One month follow up appointment card given.   Set-up films were reviewed. The chart was checked.  Physical Findings  weight is 183 lb 3.2 oz (83.1 kg). His blood pressure is 138/77 and his pulse is 69. His respiration is 16 and oxygen saturation is 100%.   Weight essentially stable.  No significant changes. Alert, in no acute distress.   Impression The patient is tolerating radiation. I reviewed the prostate cancer NCCN guidelines with the patient. The patient is interested in proceeding with ADT for 6 months.. I sent a note of our discussion today to Dr. Ramond Marrow.  Plan Continue treatment as planned. He is scheduled to complete treatment next Monday. He will return for follow up in 1 month.          Sheral Apley Tammi Klippel, M.D.  This document serves as a record of services personally performed by Tyler Pita, MD. It was created on his behalf by Arlyce Harman, a trained medical scribe. The creation of this record is based on the scribe's personal observations and the provider's statements to them. This document has been checked and approved by the attending provider.

## 2016-07-09 ENCOUNTER — Ambulatory Visit
Admission: RE | Admit: 2016-07-09 | Discharge: 2016-07-09 | Disposition: A | Discharge status: Home / Self Care | Payer: Medicare Other | Source: Ambulatory Visit | Attending: Radiation Oncology | Admitting: Radiation Oncology

## 2016-07-09 DIAGNOSIS — C61 Malignant neoplasm of prostate: Secondary | ICD-10-CM | POA: Diagnosis not present

## 2016-07-09 DIAGNOSIS — Z510 Encounter for antineoplastic radiation therapy: Secondary | ICD-10-CM | POA: Diagnosis not present

## 2016-07-12 ENCOUNTER — Encounter: Payer: Self-pay | Admitting: Medical Oncology

## 2016-07-12 ENCOUNTER — Ambulatory Visit
Admission: RE | Admit: 2016-07-12 | Discharge: 2016-07-12 | Disposition: A | Discharge status: Home / Self Care | Payer: Medicare Other | Source: Ambulatory Visit | Attending: Radiation Oncology | Admitting: Radiation Oncology

## 2016-07-12 DIAGNOSIS — Z510 Encounter for antineoplastic radiation therapy: Secondary | ICD-10-CM | POA: Diagnosis not present

## 2016-07-12 DIAGNOSIS — C61 Malignant neoplasm of prostate: Secondary | ICD-10-CM | POA: Diagnosis not present

## 2016-07-14 ENCOUNTER — Encounter: Payer: Self-pay | Admitting: Radiation Oncology

## 2016-07-14 NOTE — Progress Notes (Signed)
  Radiation Oncology         (336) [phone] ________________________________  Name: Eric Brock MRN: [id number]  Date: 07/14/2016  DOB: 15-Dec-1940  End of Treatment Note  Diagnosis:   Malignant neoplasm of prostate     Indication for treatment:  Curative, Prostatic Fossa Radiotherapy       Radiation treatment dates:   05/17/16 - 07/12/16  Site/dose:   The prostatic fossa was treated to 78 Gy in 40 fractions of 1.95 Gy  Beams/energy:   The prostatic fossa was treated using helical intensity modulated radiotherapy delivering 6 megavolt photons. Image guidance was performed with megavoltage CT studies prior to each fraction. He was immobilized with a body fix lower extremity mold.  Narrative: The patient tolerated radiation treatment relatively well. The patient reported mild dysuria as well as nocturia x 4-5. He also experienced increased urgency and frequency during the day over the course of treatment.  Plan: The patient has completed radiation treatment. He will return to radiation oncology clinic for routine followup in one month. I advised him to call or return sooner if he has any questions or concerns related to his recovery or treatment. ________________________________  Sheral Apley. Tammi Klippel, M.D.  This document serves as a record of services personally performed by Tyler Pita, MD. It was created on his behalf by Maryla Morrow, a trained medical scribe. The creation of this record is based on the scribe's personal observations and the provider's statements to them. This document has been checked and approved by the attending provider.

## 2016-07-27 ENCOUNTER — Ambulatory Visit (INDEPENDENT_AMBULATORY_CARE_PROVIDER_SITE_OTHER): Payer: Medicare Other | Admitting: Family Medicine

## 2016-07-27 VITALS — BP 104/60 | HR 72 | Temp 98.3°F | Ht 70.5 in | Wt 185.0 lb

## 2016-07-27 DIAGNOSIS — M549 Dorsalgia, unspecified: Secondary | ICD-10-CM | POA: Diagnosis not present

## 2016-07-27 DIAGNOSIS — Z23 Encounter for immunization: Secondary | ICD-10-CM | POA: Diagnosis not present

## 2016-07-27 NOTE — Progress Notes (Signed)
Pre visit review using our clinic review tool, if applicable. No additional management support is needed unless otherwise documented below in the visit note. 

## 2016-07-27 NOTE — Progress Notes (Signed)
Subjective:     Patient ID: Eric Brock, male   DOB: 03/01/1941, 75 y.o.   MRN: [id number]  HPI Patient seen with left upper back pain for the past 8-10 weeks. Denies any injury. Denies any cervical neck pain. He just finished radiation therapy for prostate cancer. He's not been sleeping well at night because of increased urine frequency. He's been using ibuprofen which helps. He describes a "soreness ". No radiculopathy symptoms. Pain is worse with movement. He is also getting Lupron injections and this is causing some hot flashes at night which are disrupting sleep. He's had some general fatigue from the radiation. Has recently had some weight gain.  Past Medical History:  Diagnosis Date  . Allergic rhinitis   . Arthritis   . Bronchiectasis pulmologist-  dr Lamonte Sakai ([place])--  per lov note 03-22-2016 stable   intermittant--  per pt last bout yr ago 2016   . Eczema   . Elevated PSA   . History of colon polyps    1999  . History of concussion    2005 and 2010 with mild cerebral bleed resolved -no residual  . History of non-Hodgkin's lymphoma oncologist-  dr Benay Spice--  pt in remission   dx 03/ 2000  ,  Stage IA,  scalp/forehead ,  post chemo/radioation therapy  . Hyperlipidemia    diet controlled, no med  . Lower urinary tract symptoms (LUTS)   . Prostate cancer [facility]) urologist-  dr Alyson Ingles  oncologist-  dr Alen Blew and dr Tammi Klippel   cT2b N0 M0, Gleason 4+3,  PSA 4.2,  vol 35.0cc--  plan IMRT w/ gold seeds and ADT  . Wears glasses    Past Surgical History:  Procedure Laterality Date  . CATARACT EXTRACTION W/ INTRAOCULAR LENS  IMPLANT, BILATERAL  2013  . CHOLECYSTECTOMY  1994  . COLONOSCOPY  last one 07-14-2015  . GOLD SEED IMPLANT N/A 04/16/2016   Procedure: GOLD SEED IMPLANT;  Surgeon: Cleon Gustin, MD;  Location: [facility];  Service: Urology;  Laterality: N/A;  . PROSTATE BIOPSY N/A 01/12/2016   Procedure: BIOPSY TRANSRECTAL ULTRASONIC PROSTATE (TUBP);   Surgeon: Cleon Gustin, MD;  Location: [facility];  Service: Urology;  Laterality: N/A;  . [place]    reports that he quit smoking about 41 years ago. His smoking use included Cigarettes. He has a 12.00 pack-year smoking history. He has never used smokeless tobacco. He reports that he drinks about 4.2 oz of alcohol per week . He reports that he does not use drugs. family history includes Brain cancer in his mother; Cancer in his brother; Heart disease in his father. No Known Allergies   Review of Systems  Constitutional: Positive for fatigue. Negative for chills and fever.  Respiratory: Negative for cough and shortness of breath.   Cardiovascular: Negative for chest pain.  Skin: Negative for rash.  Neurological: Negative for weakness and numbness.       Objective:   Physical Exam  Constitutional: He is oriented to person, place, and time. He appears well-developed and well-nourished. No distress.  Cardiovascular: Normal rate and regular rhythm.   Pulmonary/Chest: Effort normal and breath sounds normal. No respiratory distress. He has no wheezes. He has no rales.  Musculoskeletal:  Good range of motion cervical spine. He has no spinal tenderness.  Full range of motion left shoulder. He has some mid trapezius tenderness on the left side. Moderate muscle tension  Neurological: He is alert and oriented  to person, place, and time. No cranial nerve deficit.       Assessment:     Left upper back pain. Suspect muscular    Plan:     -Conservative treatment with heat, topical sports creams, and massage -Aerobic activity as tolerated -Stay well-hydrated -Consider physical therapy and/or muscle relaxer if not improving over the next couple weeks  Eulas Post MD [facility]

## 2016-07-27 NOTE — Patient Instructions (Signed)
Try heat to upper back muscles Consider muscle massage and topical creams such as Biofreeze Stay well hydrated.  Aerobic exercise such as walking.

## 2016-08-06 ENCOUNTER — Other Ambulatory Visit: Payer: Self-pay | Admitting: Emergency Medicine

## 2016-08-26 NOTE — Progress Notes (Signed)
Eric Brock.Am[age] y.o man with malignant neoplasm of prostate radiation completed 07-12-16 one month FU.  Pain: He is currently is not having pain.    URINARY: He denies  urinary frequency,urinary urgency,,hematuria.  Reports he is emptying his bladder. Pt states he gets up to urinate 2-3 times per night. BOWEL: He reports a  bowel movement everyday formed stools.  Denies  Diarrhea. Fatigue:Having fatigue in the morning and during the day does not require a nap. Appetite:Good eating three meals a day. Weight: Wt Readings from Last 3 Encounters:  08/31/16 183 lb 6.4 oz (83.2 kg)  07/27/16 185 lb (83.9 kg)  07/08/16 183 lb 3.2 oz (83.1 kg)   Urologist: Alyson Ingles, M.D. Next appointment with Dr. Alyson Ingles [phone] Lupron injection: Last  dose   05-18-16                                                          PSA:09-23-15   4.2 ng/ml  Lupron injection every 6 months last dose       BP (!) 152/69   Pulse 66   Temp 98.1 F (36.7 C) (Oral)   Resp 18   Ht 5' 10.5" (1.791 m)   Wt 183 lb 6.4 oz (83.2 kg)   SpO2 (!) 9%   BMI 25.94 kg/m

## 2016-08-31 ENCOUNTER — Ambulatory Visit
Admission: RE | Admit: 2016-08-31 | Discharge: 2016-08-31 | Disposition: A | Discharge status: Home / Self Care | Payer: Medicare Other | Source: Ambulatory Visit | Attending: Radiation Oncology | Admitting: Radiation Oncology

## 2016-08-31 ENCOUNTER — Encounter: Payer: Self-pay | Admitting: Radiation Oncology

## 2016-08-31 VITALS — BP 152/69 | HR 66 | Temp 98.1°F | Resp 18 | Ht 70.5 in | Wt 183.4 lb

## 2016-08-31 DIAGNOSIS — I10 Essential (primary) hypertension: Secondary | ICD-10-CM | POA: Insufficient documentation

## 2016-08-31 DIAGNOSIS — C61 Malignant neoplasm of prostate: Secondary | ICD-10-CM | POA: Diagnosis not present

## 2016-08-31 DIAGNOSIS — R9721 Rising PSA following treatment for malignant neoplasm of prostate: Secondary | ICD-10-CM | POA: Diagnosis not present

## 2016-08-31 NOTE — Progress Notes (Signed)
Radiation Oncology         [phone] ________________________________  Name: Eric Brock MRN: [id number]  Date: 08/31/2016  DOB: March 04, 1941  Post Treatment Note  CC: Eulas Post, MD  Cleon Gustin, MD  Diagnosis:  76 y.o. gentleman with stage T2b adenocarcinoma of the prostate with a Gleason's score of 4+3 and a PSA of 4.2  Interval Since Last Radiation:  7 weeks   74/16/17 - 74/12/11/17: The prostatic fossa was treated to 78 Gy in 40 fractions of 1.95 Gy  Narrative:  The patient returns today for routine follow-up. The patient tolerated radiotherapy well, did receive firm and on in September 2017, and Lupron in October 2017. He's due to see Dr. Alyson Ingles in March 2074.                              On review of systems, the patient states he's doing well overall. He reports normal urinary and bowel function. He continues to have hot flashes from Lupron, though these have diminished since starting Megace. He reports he has gained about 5 pounds and has been more emotional since receiving Lupron. No other complaints are verbalized.  ALLERGIES:  has No Known Allergies.  Meds: Current Outpatient Prescriptions  Medication Sig Dispense Refill  . Desoximetasone (TOPICORT) 0.25 % ointment Apply 1 application topically 2 (two) times daily. 30 g 2  . fexofenadine (ALLEGRA) 180 MG tablet Take 180 mg by mouth every morning.    . fluticasone (FLONASE) 50 MCG/ACT nasal spray PLACE 2 SPRAYS INTO BOTH NOSTRILS 2 (TWO) TIMES DAILY. 16 g 3  . GuaiFENesin (MUCINEX PO) Take 1,200 mg by mouth 2 (two) times daily. Extended release    . ibuprofen (ADVIL,MOTRIN) 200 MG tablet Take 200 mg by mouth every 6 (six) hours as needed.     . megestrol (MEGACE) 20 MG tablet Take 20 mg by mouth daily.    . Multiple Vitamins-Minerals (CENTRUM ADULTS PO) Take 1 tablet by mouth daily.    . tamsulosin (FLOMAX) 0.4 MG CAPS capsule Take 0.4 mg by mouth daily after supper.     Marland Kitchen LORazepam (ATIVAN) 0.5 MG  tablet 1 tablet po 30 minutes prior to radiation (Patient not taking: Reported on 08/31/2016) 30 tablet 0  . Naproxen Sodium (ALEVE) 220 MG CAPS Take by mouth as needed.     No current facility-administered medications for this encounter.     Physical Findings:  height is 5' 10.5" (1.791 m) and weight is 183 lb 6.4 oz (83.2 kg). His oral temperature is 98.1 F (36.7 C). His blood pressure is 152/69 (abnormal) and his pulse is 66. His respiration is 18 and oxygen saturation is 99%.  In general this is a well appearing Caucasian male in no acute distress. He's alert and oriented x4 and appropriate throughout the examination. Cardiopulmonary assessment is negative for acute distress and he exhibits normal effort.   Lab Findings: Lab Results  Component Value Date   WBC 9.1 03/17/2015   HGB 15.0 04/16/2016   HCT 44.0 04/16/2016   MCV 95.1 03/17/2015   PLT 237.0 03/17/2015     Radiographic Findings: No results found.  Impression/Plan: 1. 76 y.o. gentleman with stage T2b adenocarcinoma of the prostate with a Gleason's score of 4+3 and a PSA of 4.2. The patient appears to have recovered from the effects of radiotherapy. He will follow up with Dr. Alyson Ingles to discuss further surveillance. He is hopeful to  discontinue ADT for his intermediate risk disease. We will follow up with him as needed, and he will call with questions or concerns.  2. HTN. The patient is asymptomatic, but anxious. He will follow this with PCP.     Carola Rhine, PAC

## 2016-08-31 NOTE — Addendum Note (Signed)
Encounter addended by: Malena Edman, RN on: 08/31/2016  3:21 PM<BR>    Actions taken: Charge Capture section accepted

## 2016-09-10 DIAGNOSIS — C61 Malignant neoplasm of prostate: Secondary | ICD-10-CM | POA: Diagnosis not present

## 2016-09-30 ENCOUNTER — Telehealth: Payer: Self-pay | Admitting: Medical Oncology

## 2016-09-30 DIAGNOSIS — C61 Malignant neoplasm of prostate: Secondary | ICD-10-CM | POA: Diagnosis not present

## 2016-09-30 NOTE — Progress Notes (Signed)
I returned a call to Eric Brock to celebrate his great news. He followed up with Dr. Alyson Ingles and his PSA is undetectable. He is delighted that he will not need another Lupron injection. He was so thankful for the care he received here at [facility] and is happy to have gotten great news. I am very happy for his great news and asked him to call me if I can be of assistance in any way. He voiced understanding.

## 2016-10-13 ENCOUNTER — Encounter: Payer: Self-pay | Admitting: *Deleted

## 2016-10-20 ENCOUNTER — Telehealth: Payer: Self-pay | Admitting: Emergency Medicine

## 2016-10-20 NOTE — Telephone Encounter (Signed)
Called and spoke to pt's wife. She is requesting an appt to see if pt needs CT chest. Appt made for 11/2016 with RB. Pt's wife verbalized understanding and denied any further questions or concerns at this time.    OV instructions from 03/22/2016: Instructions  Please continue same medications as you are taking them Stay active Drink plenty of water and use your flutter valve if needed.  We can revisit timing of a repeat chest CT scan in the future Follow with Dr Lamonte Sakai in 10 months or sooner if you have any problems

## 2016-12-22 ENCOUNTER — Encounter: Payer: Self-pay | Admitting: Emergency Medicine

## 2016-12-22 ENCOUNTER — Ambulatory Visit (INDEPENDENT_AMBULATORY_CARE_PROVIDER_SITE_OTHER): Payer: Medicare Other | Admitting: Emergency Medicine

## 2016-12-22 VITALS — BP 120/68 | HR 58 | Ht 70.0 in | Wt 181.4 lb

## 2016-12-22 DIAGNOSIS — J471 Bronchiectasis with (acute) exacerbation: Secondary | ICD-10-CM | POA: Diagnosis not present

## 2016-12-22 NOTE — Addendum Note (Signed)
Addended by: Jannette Spanner on: 12/22/2016 02:38 PM   Modules accepted: Orders

## 2016-12-22 NOTE — Progress Notes (Signed)
Subjective:    Patient ID: Eric Brock, male    DOB: [birth year], 76 y.o.   MRN: [id number]  HPI 76 year old man with bronchiectasis and chronic allergic rhinitis. He's last seen by me over 2 years ago. He has been seen on multiple occasions for acute exacerbations and treated for bronchitis.  His last CT scan of the chest was in December 2014. He is doing well on mucinex, allegra and flonase nasal spray.  His last flare was 12/15. He thought he had one in 11/16 but did not have to take the pred or levaquin prescribed.  Overall doing quite well.   ROV 12/22/16 -- This follow-up visit for patient with a history of bronchiectasis and allergic rhinitis. Also with a history of prostate cancer and radiation therapy. He is taking mucinex reliably, often coughs clear mucous before bedtime. He is active, plays racketball. Taking allegra qd, flonase prn. He has not flared since last time.  Has not needed her flutter valve to clear his secretions.    Review of Systems As above  Past Medical History:  Diagnosis Date  . Allergic rhinitis   . Arthritis   . Bronchiectasis pulmologist-  dr Lamonte Sakai ([place])--  per lov note 03-22-2016 stable   intermittant--  per pt last bout yr ago 2016   . Eczema   . Elevated PSA   . History of colon polyps    1999  . History of concussion    2005 and 2010 with mild cerebral bleed resolved -no residual  . History of non-Hodgkin's lymphoma oncologist-  dr Benay Spice--  pt in remission   dx 03/ 2000  ,  Stage IA,  scalp/forehead ,  post chemo/radioation therapy  . Hyperlipidemia    diet controlled, no med  . Lower urinary tract symptoms (LUTS)   . Prostate cancer [facility]) urologist-  dr Alyson Ingles  oncologist-  dr Alen Blew and dr Tammi Klippel   cT2b N0 M0, Gleason 4+3,  PSA 4.2,  vol 35.0cc--  plan IMRT w/ gold seeds and ADT  . Wears glasses      Family History  Problem Relation Age of Onset  . Heart disease Father   . Cancer Brother        prostate  . Brain cancer  Mother   . Colon cancer Neg Hx   . Colon polyps Neg Hx   . Esophageal cancer Neg Hx   . Rectal cancer Neg Hx   . Stomach cancer Neg Hx      Social History   Social History  . Marital status: Married    Spouse name: N/A  . Number of children: N/A  . Years of education: N/A   Occupational History  . retired Retired   Social History Main Topics  . Smoking status: Former Smoker    Packs/day: 1.00    Years: 12.00    Types: Cigarettes    Quit date: 08/03/1975  . Smokeless tobacco: Never Used  . Alcohol use 4.2 oz/week    7 Glasses of wine per week     Comment: 1 glass of wine daily  . Drug use: No  . Sexual activity: Yes   Other Topics Concern  . Not on file   Social History Narrative  . No narrative on file     No Known Allergies   Outpatient Medications Prior to Visit  Medication Sig Dispense Refill  . Desoximetasone (TOPICORT) 0.25 % ointment Apply 1 application topically 2 (two) times daily. 30 g 2  .  fexofenadine (ALLEGRA) 180 MG tablet Take 180 mg by mouth every morning.    . fluticasone (FLONASE) 50 MCG/ACT nasal spray PLACE 2 SPRAYS INTO BOTH NOSTRILS 2 (TWO) TIMES DAILY. 16 g 3  . GuaiFENesin (MUCINEX PO) Take 1,200 mg by mouth 2 (two) times daily. Extended release    . ibuprofen (ADVIL,MOTRIN) 200 MG tablet Take 200 mg by mouth every 6 (six) hours as needed.     . Multiple Vitamins-Minerals (CENTRUM ADULTS PO) Take 1 tablet by mouth daily.    . Naproxen Sodium (ALEVE) 220 MG CAPS Take by mouth as needed.    . tamsulosin (FLOMAX) 0.4 MG CAPS capsule Take 0.4 mg by mouth daily after supper.     Marland Kitchen LORazepam (ATIVAN) 0.5 MG tablet 1 tablet po 30 minutes prior to radiation (Patient not taking: Reported on 08/31/2016) 30 tablet 0  . megestrol (MEGACE) 20 MG tablet Take 20 mg by mouth daily.     No facility-administered medications prior to visit.         Objective:   Physical Exam Vitals:   12/22/16 1357  BP: 120/68  Pulse: (!) 58  SpO2: 96%  Weight: 181  lb 6.4 oz (82.3 kg)  Height: 5\' 10"  (1.778 m)   Gen: Pleasant, well-nourished, in no distress,  normal affect  ENT: No lesions,  mouth clear,  oropharynx clear, no postnasal drip  Neck: No JVD, no TMG, no carotid bruits  Lungs: No use of accessory muscles, focal L insp rhonchi / squeak, no wheeze, R clear  Cardiovascular: RRR, heart sounds normal, no murmur or gallops, no peripheral edema  Musculoskeletal: No deformities, no cyanosis or clubbing  Neuro: alert, non focal  Skin: Warm, no lesions or rashes      Assessment & Plan:  BRONCHIECTASIS Please continue fexofenadine every day Continue Mucinex as you have been taking it Please use fluticasone nasal spray, 2 sprays each nostril as needed for nasal congestion We will repeat a CT scan of the chest, high-resolution cuts without contrast, to follow bronchiectasis. Have a flutter valve available to use if difficulty clearing secretions Call our office for any changes in your breathing, cough, mucous production. Follow with Dr Lamonte Sakai in 12 months or sooner if you have any problems  Baltazar Apo, MD, PhD 12/22/2016, 2:36 PM [facility] [phone] or if no answer [phone]

## 2016-12-22 NOTE — Assessment & Plan Note (Signed)
Please continue fexofenadine every day Continue Mucinex as you have been taking it Please use fluticasone nasal spray, 2 sprays each nostril as needed for nasal congestion We will repeat a CT scan of the chest, high-resolution cuts without contrast, to follow bronchiectasis. Have a flutter valve available to use if difficulty clearing secretions Call our office for any changes in your breathing, cough, mucous production. Follow with Dr Lamonte Sakai in 12 months or sooner if you have any problems

## 2016-12-22 NOTE — Patient Instructions (Signed)
Please continue fexofenadine every day Continue Mucinex as you have been taking it Please use fluticasone nasal spray, 2 sprays each nostril as needed for nasal congestion We will repeat a CT scan of the chest, high-resolution cuts without contrast, to follow bronchiectasis. Have a flutter valve available to use if difficulty clearing secretions Call our office for any changes in your breathing, cough, mucous production. Follow with Dr Lamonte Sakai in 12 months or sooner if you have any problems

## 2016-12-29 DIAGNOSIS — C61 Malignant neoplasm of prostate: Secondary | ICD-10-CM | POA: Diagnosis not present

## 2016-12-29 LAB — PSA: PSA: 0.018

## 2017-01-21 DIAGNOSIS — C61 Malignant neoplasm of prostate: Secondary | ICD-10-CM | POA: Diagnosis not present

## 2017-01-24 ENCOUNTER — Encounter: Payer: Self-pay | Admitting: *Deleted

## 2017-01-26 ENCOUNTER — Encounter: Payer: Self-pay | Admitting: Family Medicine

## 2017-02-08 ENCOUNTER — Inpatient Hospital Stay: Admission: RE | Admit: 2017-02-08 | Payer: Medicare Other | Source: Ambulatory Visit

## 2017-02-09 ENCOUNTER — Ambulatory Visit (INDEPENDENT_AMBULATORY_CARE_PROVIDER_SITE_OTHER)
Admission: RE | Admit: 2017-02-09 | Discharge: 2017-02-09 | Disposition: A | Discharge status: Home / Self Care | Payer: Medicare Other | Source: Ambulatory Visit | Attending: Emergency Medicine | Admitting: Emergency Medicine

## 2017-02-09 DIAGNOSIS — J471 Bronchiectasis with (acute) exacerbation: Secondary | ICD-10-CM | POA: Diagnosis not present

## 2017-02-09 DIAGNOSIS — J479 Bronchiectasis, uncomplicated: Secondary | ICD-10-CM | POA: Diagnosis not present

## 2017-03-14 DIAGNOSIS — M1711 Unilateral primary osteoarthritis, right knee: Secondary | ICD-10-CM | POA: Diagnosis not present

## 2017-03-25 DIAGNOSIS — M1711 Unilateral primary osteoarthritis, right knee: Secondary | ICD-10-CM | POA: Diagnosis not present

## 2017-03-28 DIAGNOSIS — H26492 Other secondary cataract, left eye: Secondary | ICD-10-CM | POA: Diagnosis not present

## 2017-04-01 DIAGNOSIS — M1711 Unilateral primary osteoarthritis, right knee: Secondary | ICD-10-CM | POA: Diagnosis not present

## 2017-04-01 IMAGING — NM NM BONE WHOLE BODY
2 series · 2 of 2 positions shown · non-contrast
Comparison: COMPARISON
CT 07/20/2013.

CLINICAL DATA: Prostate cancer.

EXAM:
NUCLEAR MEDICINE WHOLE BODY BONE SCAN
TECHNIQUE: Whole body anterior and posterior images were obtained approximately
3 hours after intravenous injection of radiopharmaceutical.
RADIOPHARMACEUTICALS:  27.0 mCi Nechnetium-TTm MDP IV

[Series 1: wbr_bone_40 whole body · 2.66mm/px · 1 of 1 slices shown (1 of 2)]
[im 1/1]
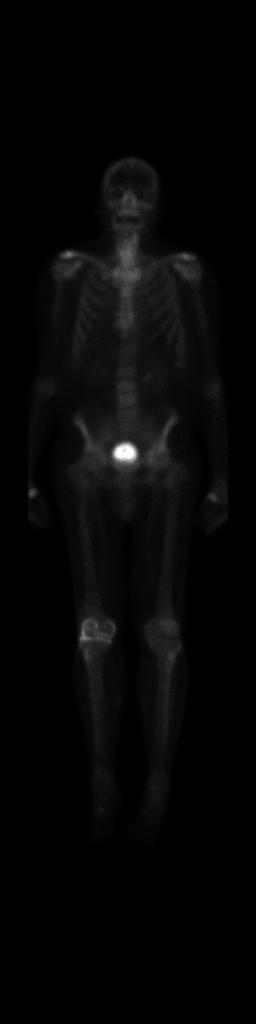

[Series 1: wbr_bone_40 whole body · 2.66mm/px · 1 of 1 slices shown (2 of 2)]
[im 1/1]
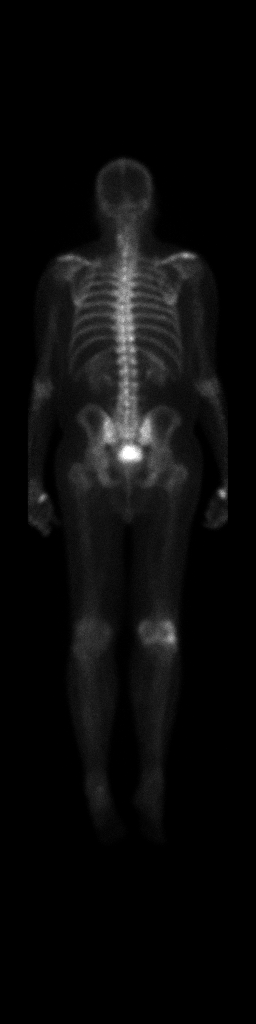

[2 of 2 positions shown; findings below may reference images not displayed]

FINDINGS: Bilateral renal function and excretion. Increased activity noted
about the right knee most likely degenerative. Right knee series can
be obtained for further evaluation. No evidence of metastatic
disease.
IMPRESSION: Increased activity noted about the right knee, most likely
degenerative. Right knee series can be obtained for further
evaluation. Exam otherwise unremarkable. No evidence of metastatic
disease.

## 2017-04-13 DIAGNOSIS — M1711 Unilateral primary osteoarthritis, right knee: Secondary | ICD-10-CM | POA: Diagnosis not present

## 2017-04-13 DIAGNOSIS — H26491 Other secondary cataract, right eye: Secondary | ICD-10-CM | POA: Diagnosis not present

## 2017-04-21 ENCOUNTER — Encounter: Payer: Self-pay | Admitting: Family Medicine

## 2017-04-21 DIAGNOSIS — C61 Malignant neoplasm of prostate: Secondary | ICD-10-CM | POA: Diagnosis not present

## 2017-04-28 DIAGNOSIS — C61 Malignant neoplasm of prostate: Secondary | ICD-10-CM | POA: Diagnosis not present

## 2017-04-29 ENCOUNTER — Encounter: Payer: Self-pay | Admitting: *Deleted

## 2017-04-29 ENCOUNTER — Encounter: Payer: Self-pay | Admitting: Emergency Medicine

## 2017-04-29 ENCOUNTER — Telehealth: Payer: Self-pay | Admitting: Emergency Medicine

## 2017-04-29 MED ORDER — AZITHROMYCIN 250 MG PO TABS: ORAL_TABLET | ORAL | 0 refills | Status: DC

## 2017-04-29 NOTE — Telephone Encounter (Signed)
Ask him to use OTC decongestant like tylenol cold & flu Have him take azithromycin, z-pack  Could also offer him hycodan 5cc up to every 6 hours if needed for cough if he would like to use this.

## 2017-04-29 NOTE — Telephone Encounter (Signed)
Called and spoke with pts wife as the pt was not there.  She stated that she will make the pt aware of RB recs and at this time they do not want the hycodan due to the pt using delsym and this has been helping.  Nothing further is needed.

## 2017-04-29 NOTE — Telephone Encounter (Signed)
I've had a cold for about one week with a lot of congestion and mucus. Mucus had been mostly clear but with some tint of green. I cough up a lot of mucus mostly at night. I'm not running a fever but have been very tired and low energy. Do you think I should go on an antibiotic, and if so would you call one in to my [facility]. I leaving for a few days starting Sunday and won't be back until next Thursday.   Eric Brock   [phone]    RB please advise thanks  No Known Allergies

## 2017-04-29 NOTE — Telephone Encounter (Signed)
Called and spoke with pt. Pt states he developed a cold last Saturday and has lingering chest congestion & occ prod cough with clear mucus mainly at night x1w. Pt taken mucinex bid & delsym prn with mild improvement.   RB please advise. Thanks.  No Known Allergies

## 2017-05-02 NOTE — Telephone Encounter (Signed)
Please make sure he is taking his allergy medications  Ok to prescribe him levaquin 500mg  daily x 7 days. May have to call in to an alternative pharmacy if he was going out of town.

## 2017-06-02 ENCOUNTER — Ambulatory Visit (INDEPENDENT_AMBULATORY_CARE_PROVIDER_SITE_OTHER): Payer: Medicare Other | Admitting: *Deleted

## 2017-06-02 DIAGNOSIS — Z23 Encounter for immunization: Secondary | ICD-10-CM

## 2017-06-18 IMAGING — US US GUIDANCE NEEDLE PLACEMENT
1 series · 12 of 12 positions shown · non-contrast
Comparison: 01/12/2016

CLINICAL DATA: Prostate carcinoma

EXAM:
IR ULTRASOUND GUIDED ASPIRATION/DRAINAGE; ULTRAOUND INTRAOPERATIVE -
NRPT [facility]
TECHNIQUE: A series of 12 images document ultrasound guidance provided for
prostate marker placement.

[Series 1: us guidance needle placement · 0.10mm/px · 12 of 12 slices shown]
[im 1/12]
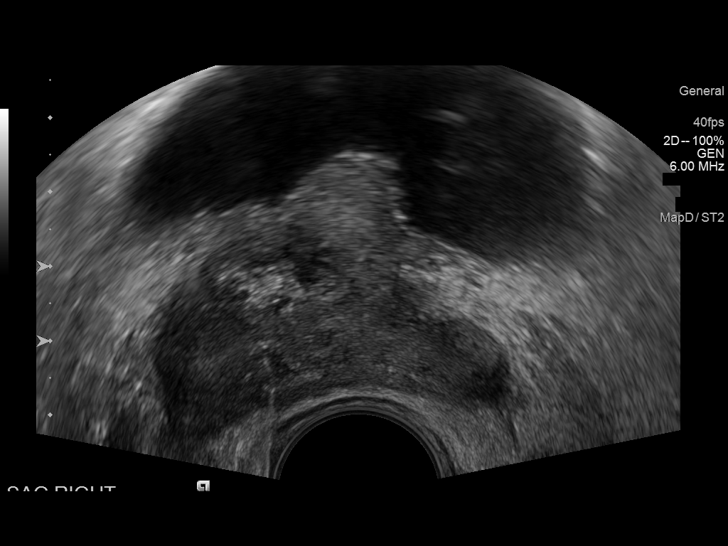
[im 2/12]
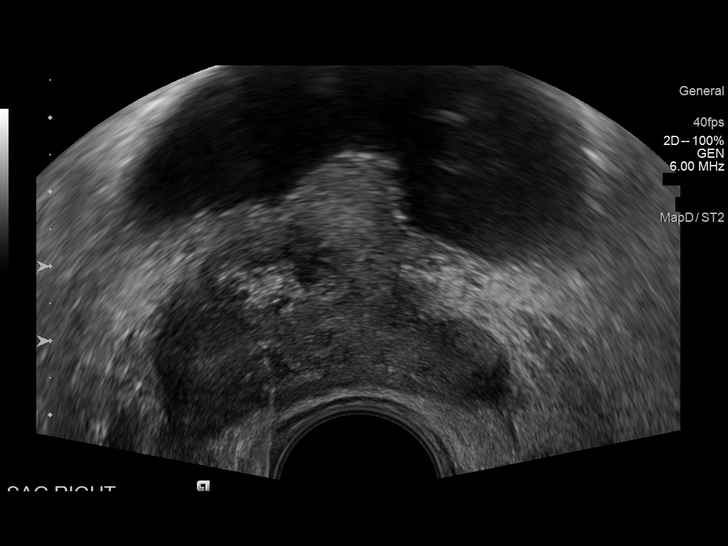
[im 3/12]
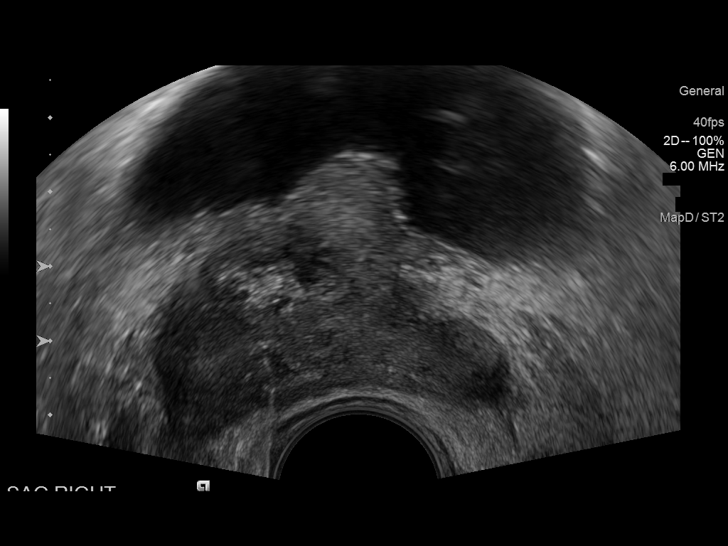
[im 4/12]
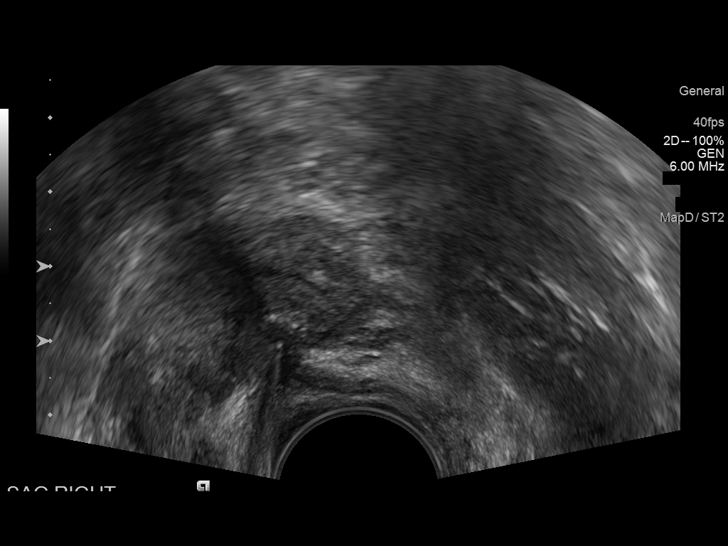
[im 5/12]
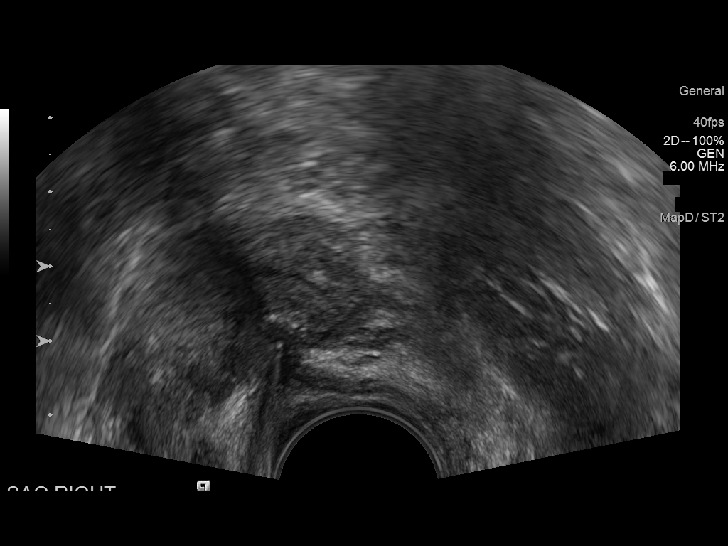
[im 6/12]
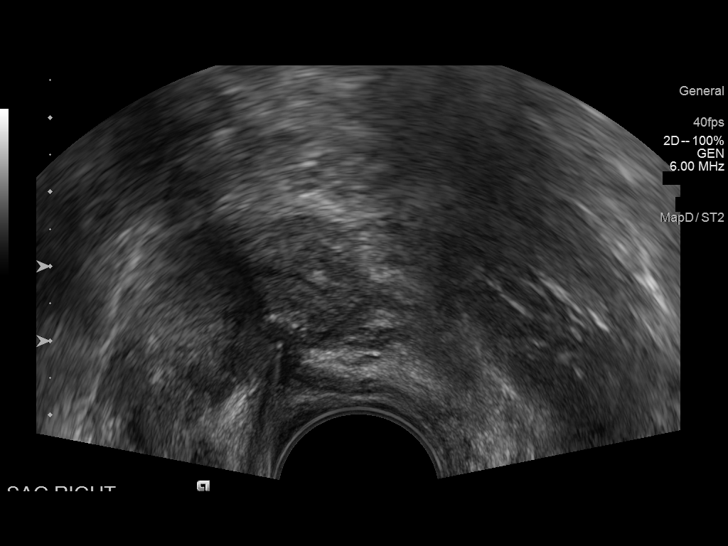
[im 7/12]
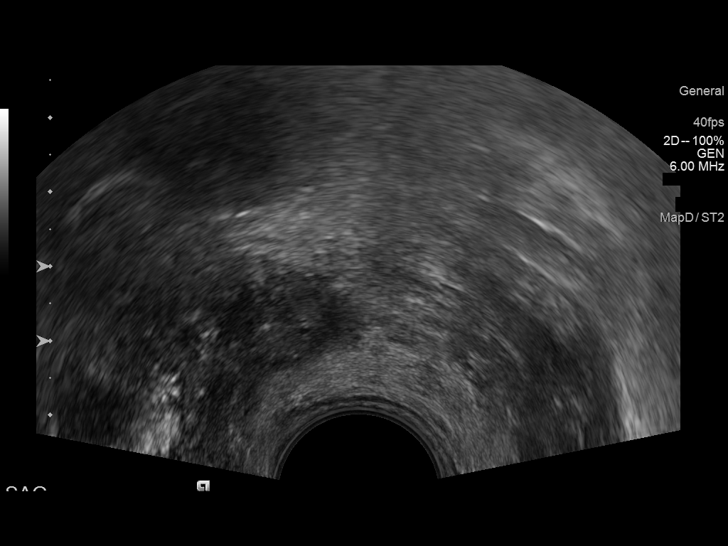
[im 8/12]
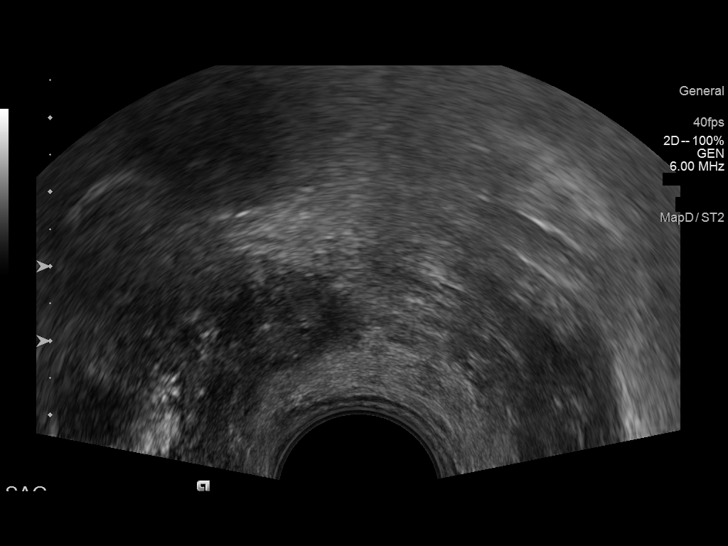
[im 9/12]
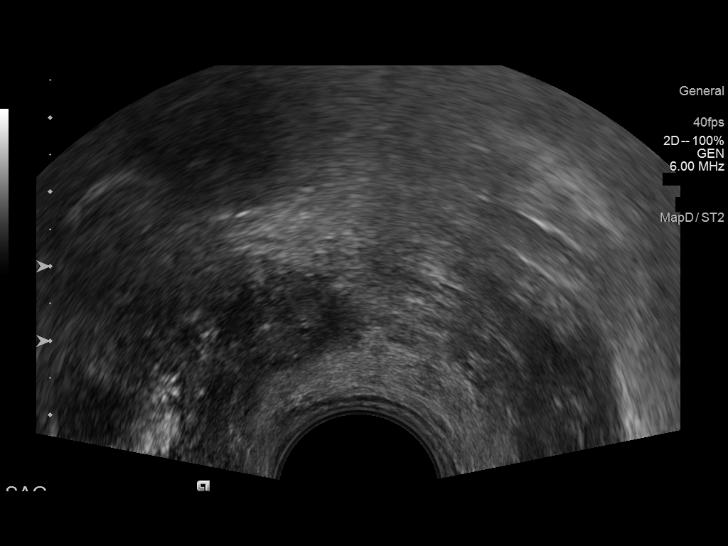
[im 10/12]
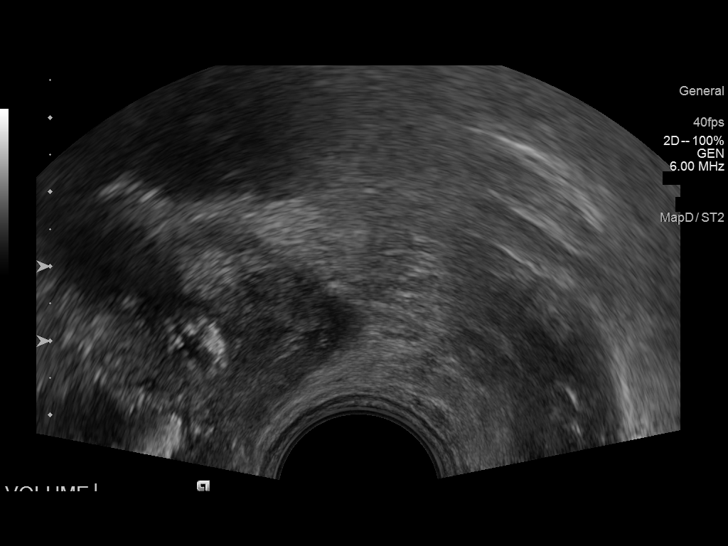
[im 11/12]
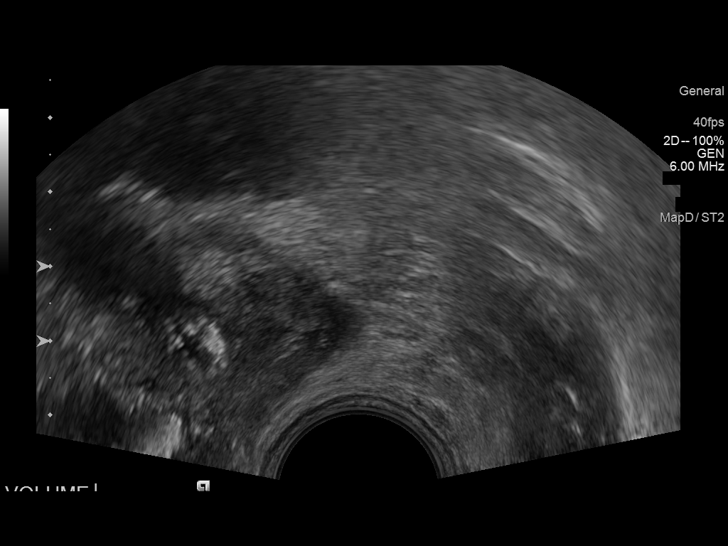
[im 12/12]
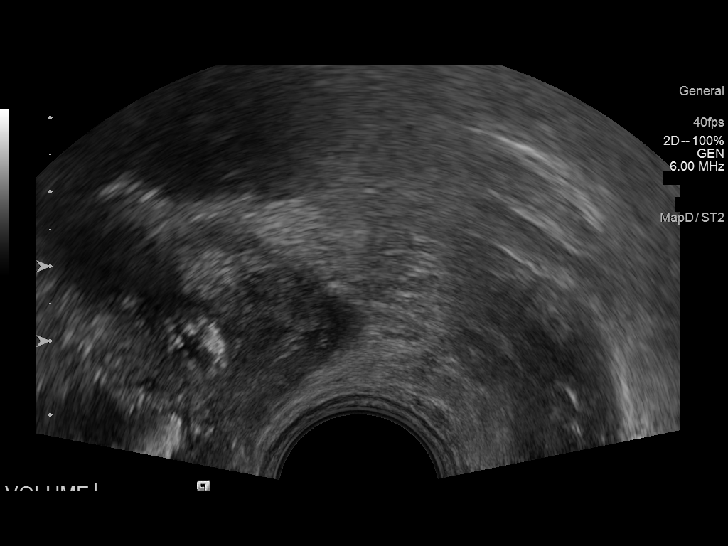

[12 of 12 positions shown; findings below may reference images not displayed]

IMPRESSION: 1. Ultrasound guidance for prostate marker placement.

## 2017-06-18 IMAGING — DX DG ABDOMEN 1V
1 series · 1 of 1 positions shown · non-contrast
Comparison: None

CLINICAL DATA: Preoperative evaluation for prostate seed
implantation

EXAM:
ABDOMEN - 1 VIEW

[abdomen kub]
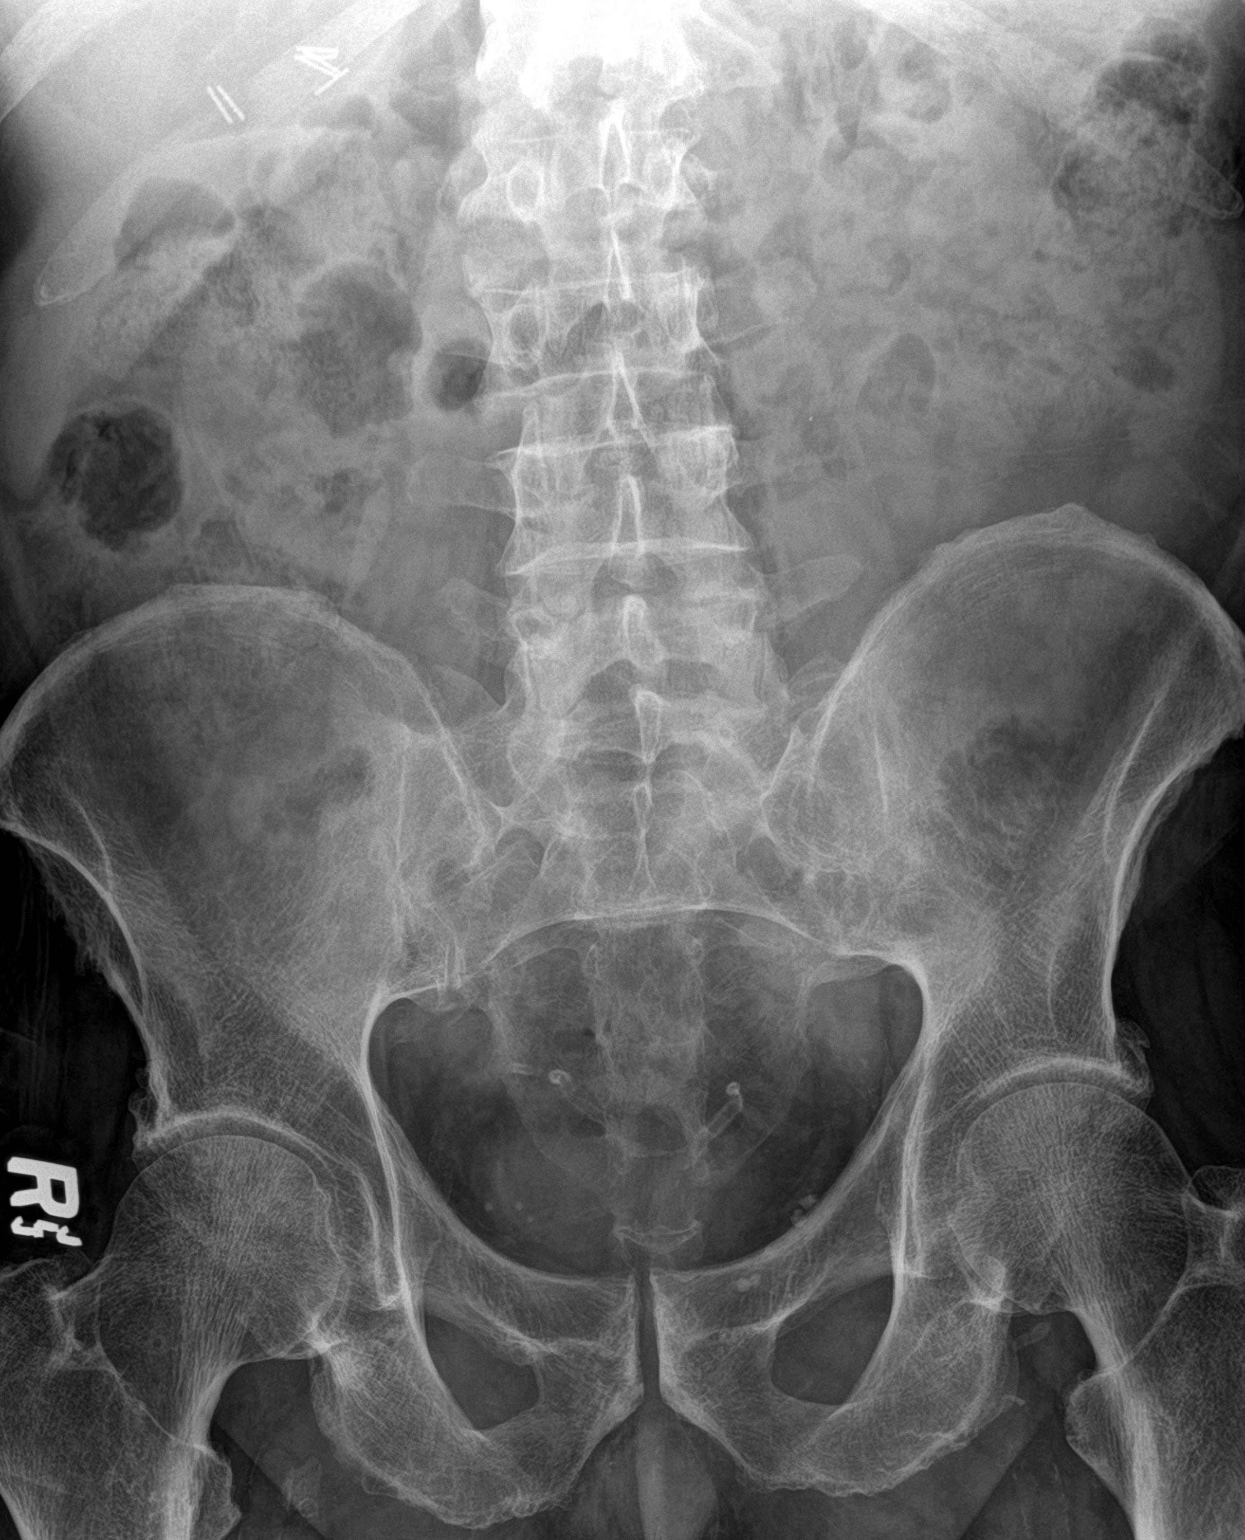

[1 of 1 positions shown; findings below may reference images not displayed]

FINDINGS: Few pelvic phleboliths, atherosclerotic calcifications, and vas
deferens calcifications.

Surgical clips RIGHT upper quadrant question cholecystectomy.

Bowel gas pattern normal.

Diffuse osseous demineralization.
IMPRESSION: No acute abnormalities

## 2017-07-21 DIAGNOSIS — C61 Malignant neoplasm of prostate: Secondary | ICD-10-CM | POA: Diagnosis not present

## 2017-07-28 DIAGNOSIS — C61 Malignant neoplasm of prostate: Secondary | ICD-10-CM | POA: Diagnosis not present

## 2017-07-28 DIAGNOSIS — N401 Enlarged prostate with lower urinary tract symptoms: Secondary | ICD-10-CM | POA: Diagnosis not present

## 2017-08-05 ENCOUNTER — Telehealth: Payer: Self-pay | Admitting: Emergency Medicine

## 2017-08-05 MED ORDER — AZITHROMYCIN 250 MG PO TABS: ORAL_TABLET | ORAL | 0 refills | Status: DC

## 2017-08-05 NOTE — Telephone Encounter (Signed)
Left message for patient regarding sick message today and MW recommendations. Patient is needing to return call.  X1

## 2017-08-05 NOTE — Telephone Encounter (Signed)
Spoke with patient. He is aware of MW's recs. Zpak has been called in for him. He stated that he would call back for a follow up after he has finished the zpak and feels better.   Nothing else needed at time of call.

## 2017-08-05 NOTE — Telephone Encounter (Signed)
zpak   Not seen in 6 m so needs ov with [place] or  np set up to follow up this flare, be sure it's better and regroup re future likely flares (? Give prn zpak for example)

## 2017-08-05 NOTE — Telephone Encounter (Signed)
Spoke with pt, he states he caught a cold and now it is going into his chest and he is worried since he has bronchiectasis and this could turn into an infection. Pt denies fever, SOB, chest pain. He is coughing with green mucus but denies sore throat. Can we send medication for pt symptoms. Please advise.   Kristopher Oppenheim [employer]

## 2017-08-05 NOTE — Telephone Encounter (Signed)
Patient returning call - he can be reached at [phone]

## 2017-08-30 ENCOUNTER — Other Ambulatory Visit: Payer: Self-pay | Admitting: Emergency Medicine

## 2017-08-30 ENCOUNTER — Telehealth: Payer: Self-pay | Admitting: Emergency Medicine

## 2017-08-30 MED ORDER — FLUTICASONE PROPIONATE 50 MCG/ACT NA SUSP: NASAL | 2 refills | Status: AC

## 2017-08-30 NOTE — Telephone Encounter (Signed)
Pt should be only using the medication 2 sprays daily as needed. I called Kristopher Oppenheim but I did not get an answer. I have sent in a new prescription with the correct instructions. Nothing further was needed.

## 2017-08-31 ENCOUNTER — Telehealth: Payer: Self-pay | Admitting: Emergency Medicine

## 2017-08-31 MED ORDER — LEVOFLOXACIN 500 MG PO TABS: 500.0000 mg | ORAL_TABLET | Freq: Every day | ORAL | 0 refills | Status: DC

## 2017-08-31 MED ORDER — PREDNISONE 10 MG PO TABS: ORAL_TABLET | ORAL | 0 refills | Status: DC

## 2017-08-31 NOTE — Telephone Encounter (Signed)
Please ask him to take Levaquin 500 mg daily for the next 5 days Have him take prednisone as follows: Take 30mg  daily for 3 days, then 20mg  daily for 3 days, then 10mg  daily for 3 days, then stop

## 2017-08-31 NOTE — Telephone Encounter (Signed)
Called pt letting him know we were sending rx of levaquin and prednisone to his pharmacy.  Pt expressed understanding. Rx of both meds sent to preferred pharmacy. Nothing further needed at this current time.

## 2017-08-31 NOTE — Telephone Encounter (Signed)
Called spoke with patient who reported that this past Sunday he began to have sneezing, runny nose and PND.  His symptoms have progressed into cough occasionally producing clear mucus, tightness in his chest, temp up to 100 (ibuprofen helps) and feels like his "bad coughing spells are shutting his breath off" since yesterday.  Pt has been taking Vicks DayQuil Cold & Sinus and Delsym for symptoms.  Denies any increased shortness of breath, wheezing, chest pain, hemoptysis Pt declined ov Pt given zpak earlier this month by DOD for similar symptoms but with green mucus - this worked well for him  [employer] [facility] NK[facility]  Dr Lamonte Sakai please advise, thank you

## 2017-10-25 DIAGNOSIS — C61 Malignant neoplasm of prostate: Secondary | ICD-10-CM | POA: Diagnosis not present

## 2017-11-01 DIAGNOSIS — N401 Enlarged prostate with lower urinary tract symptoms: Secondary | ICD-10-CM | POA: Diagnosis not present

## 2017-11-01 DIAGNOSIS — C61 Malignant neoplasm of prostate: Secondary | ICD-10-CM | POA: Diagnosis not present

## 2017-11-07 ENCOUNTER — Telehealth: Payer: Self-pay | Admitting: Emergency Medicine

## 2017-11-07 NOTE — Telephone Encounter (Signed)
Patient called back and states he will just pick this up tomorrow at his appointment.  No call back is needed.

## 2017-11-07 NOTE — Telephone Encounter (Signed)
Called patient, unable to reach left message to give us a call back. 

## 2017-11-08 ENCOUNTER — Encounter: Payer: Self-pay | Admitting: Pulmonary Disease

## 2017-11-08 ENCOUNTER — Ambulatory Visit: Payer: Medicare Other | Admitting: Pulmonary Disease

## 2017-11-08 DIAGNOSIS — J301 Allergic rhinitis due to pollen: Secondary | ICD-10-CM | POA: Diagnosis not present

## 2017-11-08 DIAGNOSIS — J479 Bronchiectasis, uncomplicated: Secondary | ICD-10-CM | POA: Diagnosis not present

## 2017-11-08 DIAGNOSIS — J309 Allergic rhinitis, unspecified: Secondary | ICD-10-CM | POA: Diagnosis not present

## 2017-11-08 DIAGNOSIS — R05 Cough: Secondary | ICD-10-CM | POA: Diagnosis not present

## 2017-11-08 MED ORDER — DOXYCYCLINE HYCLATE 100 MG PO TABS: 100.0000 mg | ORAL_TABLET | Freq: Two times a day (BID) | ORAL | 0 refills | Status: DC

## 2017-11-08 MED ORDER — PREDNISONE 10 MG PO TABS: ORAL_TABLET | ORAL | 0 refills | Status: DC

## 2017-11-08 MED ORDER — FLUTTER DEVI: 1.0000 | 0 refills | Status: AC | PRN

## 2017-11-08 NOTE — Addendum Note (Signed)
Addended by: Valerie Salts on: 11/08/2017 04:09 PM   Modules accepted: Orders

## 2017-11-08 NOTE — Assessment & Plan Note (Addendum)
Treat as exacerbation especially due to coarse breath sounds and slightly low oxygen saturation level today  Prednisone 10 mg tabs  Take 2 tabs daily with food x 5ds, then 1 tab daily with food x 5ds then STOP  Prescription for doxycycline 100 mg twice daily for 10 days-start taking if you have persistent yellow-green sputum  Flutter valve. Mucinex in the daytime and okay to take Delsym at bedtime

## 2017-11-08 NOTE — Patient Instructions (Signed)
Prednisone 10 mg tabs  Take 2 tabs daily with food x 5ds, then 1 tab daily with food x 5ds then STOP  Prescription for doxycycline 100 mg twice daily for 10 days-start taking if you have persistent yellow-green sputum  Flutter valve. Mucinex in the daytime and okay to take Delsym at bedtime

## 2017-11-08 NOTE — Progress Notes (Signed)
   Subjective:    Patient ID: Eric Brock, male    DOB: [birth year], 77 y.o.   MRN: [id number]  HPI  77 year old remote smoker with bronchiectasis and chronic allergic rhinitis He gets an exacerbation about once or twice a year. Last exacerbation was 08/2017 which did not improve with Z-Pak and required Levaquin and prednisone.  He has been well for the last 2 months.  About last week developed one episode of green sputum followed by mostly cough productive of clear white phlegm.  Coughing is persistent at night mild shortness of breath and occasional wheezing during sleep. No fevers.  Trigger is unclear and no clear URI symptoms at onset.  He states that he works outdoors a lot and uses a mask and has been having itchy eyes and nasal congestion in spite of using his Allegra and Flonase.  He has been trying Mucinex DM without much relief.  He called for a flutter valve  Reviewed HRCT from 01/2017 which shows moderate bronchiectasis especially bilateral lower lobes    Review of Systems neg for any significant sore throat, dysphagia, itching, sneezing, nasal congestion or excess/ purulent secretions, fever, chills, sweats, unintended wt loss, pleuritic or exertional cp, hempoptysis, orthopnea pnd or change in chronic leg swelling. Also denies presyncope, palpitations, heartburn, abdominal pain, nausea, vomiting, diarrhea or change in bowel or urinary habits, dysuria,hematuria, rash, arthralgias, visual complaints, headache, numbness weakness or ataxia.     Objective:   Physical Exam  Gen. Pleasant, well-nourished, in no distress ENT - no thrush, no post nasal drip Neck: No JVD, no thyromegaly, no carotid bruits Lungs: no use of accessory muscles, no dullness to percussion, bibasal fine rales , coarse BS without rhonchi  Cardiovascular: Rhythm regular, heart sounds  normal, no murmurs or gallops, no peripheral edema Musculoskeletal: No deformities, no cyanosis or clubbing          Assessment & Plan:

## 2017-11-08 NOTE — Assessment & Plan Note (Signed)
Continue Allegra and Flonase

## 2017-12-29 ENCOUNTER — Telehealth: Payer: Self-pay | Admitting: Emergency Medicine

## 2017-12-29 MED ORDER — PREDNISONE 10 MG PO TABS: ORAL_TABLET | ORAL | 0 refills | Status: DC

## 2017-12-29 NOTE — Telephone Encounter (Signed)
I think he should take a short prednisone taper to see if he benefits. He will need to call us to let us know his progress next week.  Pred > Take 40mg  daily for 3 days, then 30mg  daily for 3 days, then 20mg  daily for 3 days, then 10mg  daily for 3 days, then stop

## 2017-12-29 NOTE — Telephone Encounter (Signed)
Spoke with patient. He is aware of RB's recs. Will go ahead and call in Prednisone taper. Nothing else needed at time of call.

## 2017-12-29 NOTE — Telephone Encounter (Signed)
Called and spoke to pt. Pt c/o prod cough with clear mucus and sinus congestion x 1 week. Pt denies CP/tightness, f/c/s, and SOB. Last time pt was seen was in 10/2017, Dr. Elsworth Soho prescribed Doxy to take if mucus becomes discolored, pt took this shortly after it was prescribed. Pt is concerned that the s/s are going to worsen and he id going out of town next week, pt requesting recs today. Pt is taking Mucinex BID and Allegra daily.  Dr. Lamonte Sakai please advise. Thanks.

## 2018-02-09 DIAGNOSIS — M1711 Unilateral primary osteoarthritis, right knee: Secondary | ICD-10-CM | POA: Diagnosis not present

## 2018-02-09 DIAGNOSIS — M1611 Unilateral primary osteoarthritis, right hip: Secondary | ICD-10-CM | POA: Diagnosis not present

## 2018-03-13 ENCOUNTER — Ambulatory Visit: Payer: Medicare Other | Admitting: Emergency Medicine

## 2018-03-13 ENCOUNTER — Encounter: Payer: Self-pay | Admitting: Emergency Medicine

## 2018-03-13 DIAGNOSIS — J479 Bronchiectasis, uncomplicated: Secondary | ICD-10-CM | POA: Diagnosis not present

## 2018-03-13 DIAGNOSIS — J301 Allergic rhinitis due to pollen: Secondary | ICD-10-CM | POA: Diagnosis not present

## 2018-03-13 DIAGNOSIS — J471 Bronchiectasis with (acute) exacerbation: Secondary | ICD-10-CM

## 2018-03-13 NOTE — Progress Notes (Signed)
Eric Brock    [id number]    08-Feb-1941  Primary Care Physician:Burchette, Alinda Sierras, MD  Referring Physician: Eulas Post, MD [place], [place] [address]  Chief complaint:   Bronchiectasis and chronic cough  HPI: Eric Brock is a 77 yo remote smoker w/ PMH of prostate cancer s/p radiation therapy here at the clinic for his bronchiectasis. He last had a flare up in 10/2017 treated with short course of doxy and prednisone. He believes the flare up was 2/2 overexposure to spring allergens. Since then he has had no acute exacerbation. Currently having daily cough managed well with mucinex and allegra. He states he had a recent trip to [place] where he walked around all day without any dyspneic event or significant cough. He denies any dyspnea, night-time symptoms, recent hospitalizations. Denies any F/N/V/D/C   Allergies as of 03/13/2018  . (No Known Allergies)    Past Medical History:  Diagnosis Date  . Allergic rhinitis   . Arthritis   . Bronchiectasis pulmologist-  dr Lamonte Sakai ([place])--  per lov note 03-22-2016 stable   intermittant--  per pt last bout yr ago 2016   . Eczema   . Elevated PSA   . History of colon polyps    1999  . History of concussion    2005 and 2010 with mild cerebral bleed resolved -no residual  . History of non-Hodgkin's lymphoma oncologist-  dr Benay Spice--  pt in remission   dx 03/ 2000  ,  Stage IA,  scalp/forehead ,  post chemo/radioation therapy  . Hyperlipidemia    diet controlled, no med  . Lower urinary tract symptoms (LUTS)   . Prostate cancer [facility]) urologist-  dr Alyson Ingles  oncologist-  dr Alen Blew and dr Tammi Klippel   cT2b N0 M0, Gleason 4+3,  PSA 4.2,  vol 35.0cc--  plan IMRT w/ gold seeds and ADT  . Wears glasses     Past Surgical History:  Procedure Laterality Date  . CATARACT EXTRACTION W/ INTRAOCULAR LENS  IMPLANT, BILATERAL  2013  . CHOLECYSTECTOMY  1994  . COLONOSCOPY  last one 07-14-2015  . GOLD SEED  IMPLANT N/A 04/16/2016   Procedure: GOLD SEED IMPLANT;  Surgeon: Cleon Gustin, MD;  Location[facility];  Service: Urology;  Laterality: N/A;  . PROSTATE BIOPSY N/A 01/12/2016   Procedure: BIOPSY TRANSRECTAL ULTRASONIC PROSTATE (TUBP);  Surgeon: Cleon Gustin, MD;  Location[facility];  Service: Urology;  Laterality: N/A;  . UMBILICAL HERNIA REPAIR  1992     Review of systems: Review of Systems  Constitutional: Negative for fever and chills.  HENT: Negative.   Eyes: Negative for blurred vision.  Respiratory: as per HPI  Cardiovascular: Negative for chest pain and palpitations.  Gastrointestinal: Negative for vomiting, diarrhea, blood per rectum. Genitourinary: Negative for dysuria, urgency, frequency and hematuria.  All other systems reviewed and are negative.  Physical Exam: Vitals:   03/13/18 0853  BP: 124/78  Pulse: (!) 54  SpO2: 97%    Gen:      No acute distress HEENT:  EOMI, sclera anicteric Neck:     No masses; no thyromegaly Lungs:    Bronchial breath sounds; normal respiratory effort CV:         Regular rate and rhythm; no murmurs Ext:    No edema; adequate peripheral perfusion Skin:      Warm and dry; no rash Neuro: alert and oriented x 3 Psych:  normal mood and affect  Data Reviewed: Last CT on 01/2017 show chronic infectious bronchiolitis with tree-in-bud opacities in mid lungs which are unchanged from prior. No new opacities or nodules  A/P: Allergic rhinitis Well managed with Allegra. No significant rhinitis.  - C/w allegra with flonase  BRONCHIECTASIS Eric Brock presenting for management of his chronic cough. Last flare of bronchiectasis on 10/2017 treated w/ prednisone and doxy. No other events since then. Denies any dyspnea or night time cough. Last CT in 2018 show stable chronic bronchiolitis.    - C/w mucinex daily for cough - Repeat CT in 01/2019    Eric Brock, PGY1 [facility] 03/13/2018, 9:25 AM  CC: Eulas Post, MD

## 2018-03-13 NOTE — Assessment & Plan Note (Signed)
Well managed with Allegra. No significant rhinitis.  - C/w allegra with flonase

## 2018-03-13 NOTE — Patient Instructions (Addendum)
Eric Brock  We reviewed your CT last year and we feel we can spare you some radiation. Let us schedule you for a follow up CT in July of 2020. Thank you for visiting the clinic.

## 2018-03-13 NOTE — Assessment & Plan Note (Addendum)
Eric Brock presenting for management of his chronic cough. Last flare of bronchiectasis on 10/2017 treated w/ prednisone and doxy. No other events since then. Denies any dyspnea or night time cough. Last CT in 2018 show stable chronic bronchiolitis.    - C/w mucinex daily for cough - Repeat CT in 01/2019

## 2018-04-07 ENCOUNTER — Ambulatory Visit: Payer: Medicare Other | Admitting: Nurse Practitioner

## 2018-04-07 ENCOUNTER — Encounter: Payer: Self-pay | Admitting: Nurse Practitioner

## 2018-04-07 ENCOUNTER — Telehealth: Payer: Self-pay | Admitting: Emergency Medicine

## 2018-04-07 DIAGNOSIS — J479 Bronchiectasis, uncomplicated: Secondary | ICD-10-CM | POA: Diagnosis not present

## 2018-04-07 DIAGNOSIS — J301 Allergic rhinitis due to pollen: Secondary | ICD-10-CM

## 2018-04-07 MED ORDER — DOXYCYCLINE HYCLATE 100 MG PO TABS: 100.0000 mg | ORAL_TABLET | Freq: Two times a day (BID) | ORAL | 0 refills | Status: DC

## 2018-04-07 MED ORDER — PREDNISONE 10 MG PO TABS: ORAL_TABLET | ORAL | 0 refills | Status: DC

## 2018-04-07 NOTE — Telephone Encounter (Signed)
He needs to be scheduled to see NP if we hav one available instead of expecting phone care if at all possible - if not agreeable then maybe his primary can treat him over the phone

## 2018-04-07 NOTE — Progress Notes (Signed)
@Patient  ID: Eric Brock, male    DOB: 1941-04-04, 77 y.o.   MRN: [id number]  Chief Complaint  Patient presents with  . Wheezing    with cough and congestion    Referring provider: Eulas Post, MD  HPI   77 year old male former smoker with bronchiectasis followed by Dr. Lamonte Sakai.  PMH of prostate cancer s/p radiation therapy.   Tests: HRCT 02/09/17 - Spectrum of findings compatible with chronic infectious bronchiolitis due to atypical mycobacterial infection (MAI). The mild to moderate cylindrical and varicoid bronchiectasis and associated tree-in-bud opacities in the mid lungs are stable. Mildly worsened postinfectious scarring in the lingula. Stable postinfectious scarring in the right mid lung, including stable near complete right middle lobe volume loss due to scarring. Left main and 3 vessel coronary atherosclerosis.  OV 04/07/18 - Acute cough and congestion  Patient presents with cough, wheezing and chest congestion. Cough is worse at night. Symptoms started 3 days ago and have progressively worsened. He denies chest tightness, SOB, fever. He is taking mucinex and delsym. States that he is worried about going into the weekend with out getting treatment.    No Known Allergies  Immunization History  Administered Date(s) Administered  . Influenza Split 05/03/2011, 04/04/2012, 04/02/2013, 04/02/2014  . Influenza Whole 05/03/2007, 05/13/2010  . Influenza, High Dose Seasonal PF 05/15/2015, 04/21/2016, 06/02/2017  . Influenza,inj,Quad PF,6+ Mos 03/20/2014  . Pneumococcal Conjugate-13 03/20/2014  . Pneumococcal Polysaccharide-23 08/02/2002, 08/02/2005, 02/25/2010  . Td 08/02/2005  . Tdap 07/27/2016  . Varicella 08/02/2009    Past Medical History:  Diagnosis Date  . Allergic rhinitis   . Arthritis   . Bronchiectasis pulmologist-  dr Lamonte Sakai ([place])--  per lov note 03-22-2016 stable   intermittant--  per pt last bout yr ago 2016   . Eczema   . Elevated PSA   .  History of colon polyps    1999  . History of concussion    2005 and 2010 with mild cerebral bleed resolved -no residual  . History of non-Hodgkin's lymphoma oncologist-  dr Benay Spice--  pt in remission   dx 03/ 2000  ,  Stage IA,  scalp/forehead ,  post chemo/radioation therapy  . Hyperlipidemia    diet controlled, no med  . Lower urinary tract symptoms (LUTS)   . Prostate cancer [facility]) urologist-  dr Alyson Ingles  oncologist-  dr Alen Blew and dr Tammi Klippel   cT2b N0 M0, Gleason 4+3,  PSA 4.2,  vol 35.0cc--  plan IMRT w/ gold seeds and ADT  . Wears glasses     Tobacco History: Social History   Tobacco Use  Smoking Status Former Smoker  . Packs/day: 1.00  . Years: 12.00  . Pack years: 12.00  . Types: Cigarettes  . Last attempt to quit: 08/03/1975  . Years since quitting: 42.7  Smokeless Tobacco Never Used   Counseling given: Yes   Outpatient Encounter Medications as of 04/07/2018  Medication Sig  . Avanafil (STENDRA) 200 MG TABS Take 1 tablet by mouth once a week.  . Desoximetasone (TOPICORT) 0.25 % ointment Apply 1 application topically 2 (two) times daily.  . fexofenadine (ALLEGRA) 180 MG tablet Take 180 mg by mouth every morning.  . fluticasone (FLONASE) 50 MCG/ACT nasal spray SPRAY TWO SPRAYS IN EACH NOSTRIL DAILY AS NEEDED  . GuaiFENesin (MUCINEX PO) Take 1,200 mg by mouth 2 (two) times daily. Extended release  . ibuprofen (ADVIL,MOTRIN) 200 MG tablet Take 200 mg by mouth every 6 (six) hours as needed.   Marland Kitchen  Multiple Vitamins-Minerals (CENTRUM ADULTS PO) Take 1 tablet by mouth daily.  . Naproxen Sodium (ALEVE) 220 MG CAPS Take by mouth as needed.  Marland Kitchen Respiratory Therapy Supplies (FLUTTER) DEVI 1 Device by Does not apply route as needed.  . tamsulosin (FLOMAX) 0.4 MG CAPS capsule Take 0.4 mg by mouth daily after supper.   . doxycycline (VIBRA-TABS) 100 MG tablet Take 1 tablet (100 mg total) by mouth 2 (two) times daily.  . predniSONE (DELTASONE) 10 MG tablet Take 4 tabs for 2 days, then  3 tabs for 2 days, then 2 tabs for 2 days, then 1 tab for 2 days, then stop   No facility-administered encounter medications on file as of 04/07/2018.      Review of Systems  Review of Systems  Constitutional: Negative.  Negative for chills and fever.  HENT: Negative.   Respiratory: Negative for cough and shortness of breath.   Cardiovascular: Negative.   Gastrointestinal: Negative.   Allergic/Immunologic: Negative.   Neurological: Negative.   Psychiatric/Behavioral: Negative.        Physical Exam  BP 138/72 (BP Location: Left Arm, Patient Position: Sitting, Cuff Size: Normal)   Pulse 61   Ht 5' 10.5" (1.791 m)   Wt 186 lb 12.8 oz (84.7 kg)   SpO2 96%   BMI 26.42 kg/m   Wt Readings from Last 5 Encounters:  04/07/18 186 lb 12.8 oz (84.7 kg)  03/13/18 187 lb (84.8 kg)  11/08/17 187 lb 6.4 oz (85 kg)  12/22/16 181 lb 6.4 oz (82.3 kg)  08/31/16 183 lb 6.4 oz (83.2 kg)     Physical Exam  Constitutional: He is oriented to person, place, and time. He appears well-developed and well-nourished. No distress.  Cardiovascular: Normal rate and regular rhythm.  Pulmonary/Chest: Effort normal and breath sounds normal.  Neurological: He is alert and oriented to person, place, and time.  Skin: Skin is warm and dry.  Psychiatric: He has a normal mood and affect.  Nursing note and vitals reviewed.     Assessment & Plan:   BRONCHIECTASIS Patient Instructions  Will order doxycycline Will order prednisone taper Continue mucinex and delsym Follow up with Dr. Lamonte Sakai in 6 months      Allergic rhinitis Continue allegra     Fenton Foy, NP 04/07/2018

## 2018-04-07 NOTE — Telephone Encounter (Signed)
Spoke with pt. He is aware of MW's recommendations. OV has been scheduled today at 10:15am with Tonya. Nothing further was needed.

## 2018-04-07 NOTE — Telephone Encounter (Signed)
Spoke with pt. States that he is not feeling well. Reports increased cough, wheezing and chest congestion. Cough is worse at night time and is producing clear mucus. Denies chest tightness, SOB or fever. Declined appointment. Would like to have something sent in.  MW - please advise as RB is not available today. Thanks.

## 2018-04-07 NOTE — Assessment & Plan Note (Signed)
Patient Instructions  Will order doxycycline Will order prednisone taper Continue mucinex and delsym Follow up with Dr. Lamonte Sakai in 6 months

## 2018-04-07 NOTE — Assessment & Plan Note (Signed)
Continue allegra  

## 2018-04-07 NOTE — Patient Instructions (Addendum)
Will order doxycycline Will order prednisone taper Continue mucinex and delsym Follow up with Dr. Lamonte Sakai in 6 months

## 2018-04-25 DIAGNOSIS — Z961 Presence of intraocular lens: Secondary | ICD-10-CM | POA: Diagnosis not present

## 2018-04-25 DIAGNOSIS — H1851 Endothelial corneal dystrophy: Secondary | ICD-10-CM | POA: Diagnosis not present

## 2018-06-14 ENCOUNTER — Encounter: Payer: Self-pay | Admitting: Family Medicine

## 2018-06-14 ENCOUNTER — Ambulatory Visit (INDEPENDENT_AMBULATORY_CARE_PROVIDER_SITE_OTHER): Payer: Medicare Other | Admitting: Family Medicine

## 2018-06-14 ENCOUNTER — Other Ambulatory Visit: Payer: Self-pay

## 2018-06-14 VITALS — BP 128/76 | HR 60 | Temp 98.0°F | Ht 70.25 in | Wt 186.7 lb

## 2018-06-14 DIAGNOSIS — Z0000 Encounter for general adult medical examination without abnormal findings: Secondary | ICD-10-CM

## 2018-06-14 LAB — CBC WITH DIFFERENTIAL/PLATELET
BASOS ABS: 0.1 10*3/uL (ref 0.0–0.1)
Basophils Relative: 0.9 % (ref 0.0–3.0)
Eosinophils Absolute: 0.3 10*3/uL (ref 0.0–0.7)
Eosinophils Relative: 4 % (ref 0.0–5.0)
HEMATOCRIT: 45.2 % (ref 39.0–52.0)
Hemoglobin: 15.4 g/dL (ref 13.0–17.0)
LYMPHS PCT: 17.4 % (ref 12.0–46.0)
Lymphs Abs: 1.3 10*3/uL (ref 0.7–4.0)
MCHC: 34.2 g/dL (ref 30.0–36.0)
MCV: 96.1 fl (ref 78.0–100.0)
MONOS PCT: 6.3 % (ref 3.0–12.0)
Monocytes Absolute: 0.5 10*3/uL (ref 0.1–1.0)
NEUTROS PCT: 71.4 % (ref 43.0–77.0)
Neutro Abs: 5.5 10*3/uL (ref 1.4–7.7)
Platelets: 221 10*3/uL (ref 150.0–400.0)
RBC: 4.7 Mil/uL (ref 4.22–5.81)
RDW: 12.9 % (ref 11.5–15.5)
WBC: 7.6 10*3/uL (ref 4.0–10.5)

## 2018-06-14 LAB — LIPID PANEL
CHOL/HDL RATIO: 4
Cholesterol: 222 mg/dL — ABNORMAL HIGH (ref 0–200)
HDL: 54.1 mg/dL (ref >39.00)
LDL Cholesterol: 150 mg/dL — ABNORMAL HIGH (ref 0–99)
NONHDL: 167.81
Triglycerides: 90 mg/dL (ref 0.0–149.0)
VLDL: 18 mg/dL (ref 0.0–40.0)

## 2018-06-14 LAB — HEPATIC FUNCTION PANEL
ALBUMIN: 4.7 g/dL (ref 3.5–5.2)
ALK PHOS: 58 U/L (ref 39–117)
ALT: 19 U/L (ref 0–53)
AST: 17 U/L (ref 0–37)
BILIRUBIN DIRECT: 0.1 mg/dL (ref 0.0–0.3)
TOTAL PROTEIN: 7 g/dL (ref 6.0–8.3)
Total Bilirubin: 0.7 mg/dL (ref 0.2–1.2)

## 2018-06-14 LAB — BASIC METABOLIC PANEL
BUN: 17 mg/dL (ref 6–23)
CALCIUM: 9.9 mg/dL (ref 8.4–10.5)
CO2: 30 mEq/L (ref 19–32)
CREATININE: 0.97 mg/dL (ref 0.40–1.50)
Chloride: 105 mEq/L (ref 96–112)
GFR: 80.11 mL/min (ref >60.00)
GLUCOSE: 106 mg/dL — AB (ref 70–99)
Potassium: 4.6 mEq/L (ref 3.5–5.1)
SODIUM: 141 meq/L (ref 135–145)

## 2018-06-14 LAB — POCT URINALYSIS DIPSTICK
Bilirubin, UA: NEGATIVE
Blood, UA: NEGATIVE
Glucose, UA: NEGATIVE
KETONES UA: NEGATIVE
Leukocytes, UA: NEGATIVE
NITRITE UA: NEGATIVE
Odor: NEGATIVE
PROTEIN UA: NEGATIVE
Spec Grav, UA: 1.025 (ref 1.010–1.025)
Urobilinogen, UA: 0.2 E.U./dL
pH, UA: 6 (ref 5.0–8.0)

## 2018-06-14 LAB — TSH: TSH: 1.93 u[IU]/mL (ref 0.35–4.50)

## 2018-06-14 NOTE — Progress Notes (Signed)
Subjective:     Patient ID: Eric Brock, male   DOB: Mar 23, 1941, 77 y.o.   MRN: [id number]  HPI Patient seen for physical exam.  He has history of prostate cancer and had radiation therapy followed by hormonal therapy.  He is seen regularly by urologist and is getting PSAs through them.  He has history of bronchiectasis and is followed by pulmonary.  He has some osteoarthritis.  Past history of non-Hodgkin's lymphoma.  Generally feels well.  He has had some recent mild weight gain.  He had flu vaccine.  Has had previous pneumonia vaccines.  Tetanus 2017.  Colonoscopy 2016.  He has about a 3-week history of some pain right lower lateral rib cage area.  Denies specific injury.  No pleuritic pain.  No worsening cough.  No hemoptysis.  No fevers or chills.  No weight loss.  Past Medical History:  Diagnosis Date  . Allergic rhinitis   . Arthritis   . Bronchiectasis pulmologist-  dr Lamonte Sakai ([place])--  per lov note 03-22-2016 stable   intermittant--  per pt last bout yr ago 2016   . Eczema   . Elevated PSA   . History of colon polyps    1999  . History of concussion    2005 and 2010 with mild cerebral bleed resolved -no residual  . History of non-Hodgkin's lymphoma oncologist-  dr Benay Spice--  pt in remission   dx 03/ 2000  ,  Stage IA,  scalp/forehead ,  post chemo/radioation therapy  . Hyperlipidemia    diet controlled, no med  . Lower urinary tract symptoms (LUTS)   . Prostate cancer [facility]) urologist-  dr Alyson Ingles  oncologist-  dr Alen Blew and dr Tammi Klippel   cT2b N0 M0, Gleason 4+3,  PSA 4.2,  vol 35.0cc--  plan IMRT w/ gold seeds and ADT  . Wears glasses    Past Surgical History:  Procedure Laterality Date  . CATARACT EXTRACTION W/ INTRAOCULAR LENS  IMPLANT, BILATERAL  2013  . CHOLECYSTECTOMY  1994  . COLONOSCOPY  last one 07-14-2015  . GOLD SEED IMPLANT N/A 04/16/2016   Procedure: GOLD SEED IMPLANT;  Surgeon: Cleon Gustin, MD;  Location: [facility];  Service:  Urology;  Laterality: N/A;  . PROSTATE BIOPSY N/A 01/12/2016   Procedure: BIOPSY TRANSRECTAL ULTRASONIC PROSTATE (TUBP);  Surgeon: Cleon Gustin, MD;  Location: [facility];  Service: Urology;  Laterality: N/A;  . [place]    reports that he quit smoking about 42 years ago. His smoking use included cigarettes. He has a 12.00 pack-year smoking history. He has never used smokeless tobacco. He reports that he drinks about 7.0 standard drinks of alcohol per week. He reports that he does not use drugs. family history includes Brain cancer in his mother; Cancer in his brother; Heart disease in his father. No Known Allergies   Review of Systems  Constitutional: Negative for activity change, appetite change, fatigue and fever.  HENT: Negative for congestion, ear pain and trouble swallowing.   Eyes: Negative for pain and visual disturbance.  Respiratory: Negative for cough, shortness of breath and wheezing.   Cardiovascular: Negative for chest pain and palpitations.  Gastrointestinal: Negative for abdominal distention, abdominal pain, blood in stool, constipation, diarrhea, nausea, rectal pain and vomiting.  Genitourinary: Negative for dysuria, hematuria and testicular pain.  Musculoskeletal: Negative for arthralgias and joint swelling.  Skin: Negative for rash.  Neurological: Negative for dizziness, syncope and headaches.  Hematological: Negative for  adenopathy.  Psychiatric/Behavioral: Negative for confusion and dysphoric mood.       Objective:   Physical Exam  Constitutional: He is oriented to person, place, and time. He appears well-developed and well-nourished. No distress.  HENT:  Head: Normocephalic and atraumatic.  Right Ear: External ear normal.  Left Ear: External ear normal.  Mouth/Throat: Oropharynx is clear and moist.  Eyes: Pupils are equal, round, and reactive to light. Conjunctivae and EOM are normal.  Neck: Normal range of motion.  Neck supple. No thyromegaly present.  Cardiovascular: Normal rate, regular rhythm and normal heart sounds.  No murmur heard. Pulmonary/Chest: No respiratory distress. He has no wheezes. He has no rales.  Abdominal: Soft. Bowel sounds are normal. He exhibits no distension and no mass. There is no tenderness. There is no rebound and no guarding.  Musculoskeletal: He exhibits no edema.  Lymphadenopathy:    He has no cervical adenopathy.  Neurological: He is alert and oriented to person, place, and time. He displays normal reflexes. No cranial nerve deficit.  Skin: No rash noted.  Psychiatric: He has a normal mood and affect.       Assessment:     Physical exam.  Patient was diagnosed couple years ago with prostate cancer and has finished his hormonal therapy.  The following health maintenance issues were addressed    Plan:     -Flu vaccine already given -Other immunizations up-to-date. -Colonoscopy up-to-date -Obtain screening labs.  We will not get PSA since this is followed through urology.  Eulas Post MD [facility]

## 2018-06-15 ENCOUNTER — Telehealth: Payer: Self-pay | Admitting: Emergency Medicine

## 2018-06-15 NOTE — Telephone Encounter (Signed)
Called and spoke with patient, he is aware that per RB he is to have a follow up CT scan in July 2020. Patient verbalized understanding. Nothing further needed.

## 2018-07-14 ENCOUNTER — Telehealth: Payer: Self-pay | Admitting: Emergency Medicine

## 2018-07-14 MED ORDER — DOXYCYCLINE HYCLATE 100 MG PO TABS: 100.0000 mg | ORAL_TABLET | Freq: Two times a day (BID) | ORAL | 0 refills | Status: DC

## 2018-07-14 NOTE — Telephone Encounter (Signed)
Please make sure he is taking Mucinex 600 mg twice a day. Please have him use an over-the-counter decongestant like Tylenol Cold and flu or TheraFlu as needed for symptom control Please asked him to take doxycycline 100 mg twice daily for 7 days He needs to call us next week to report how he is doing.  In particular he needs to let us know if his mucus changes color, increases in volume.

## 2018-07-14 NOTE — Telephone Encounter (Signed)
Called and spoke with patients wife, she is aware and verbalized understanding. Nothing further needed. Medications sent in.

## 2018-07-14 NOTE — Telephone Encounter (Signed)
Called and spoke with Patient.  He has been feeling bad for the past 2 days.  He has been running a low grade fever, up to 100. Denies any SHOB. He has a productive cough, with thick white mucus.  He has been taking Ibuprofen to keep his fever down.  He declined appointment with a NP today.  He is requesting a prescription to be sent to [place] pharmacy.    Will route to Dr. Lamonte Sakai

## 2018-07-19 ENCOUNTER — Telehealth: Payer: Self-pay | Admitting: Emergency Medicine

## 2018-07-19 NOTE — Telephone Encounter (Signed)
Called pt and spoke with pt's wife Bethena Roys who wanted to know if there was a decongestant pt could take. I referred back to phone message from 12/13 and stated to Bethena Roys that [place] stated pt could take decongestant of tylenol cold and flu or theraflu.   Bethena Roys expressed understanding. Nothing further needed.

## 2018-10-04 NOTE — Progress Notes (Signed)
@Patient  ID: Eric Brock, male    DOB: Feb 01, 1941, 78 y.o.   MRN: [id number]  Chief Complaint  Patient presents with  . Follow-up    allergies bothering him some - nasal drainage and cough    Referring provider: Eulas Post, MD  HPI:  78 year old male former smoker followed in our office for bronchiectasis  PMH: Allergic rhinitis Smoker/ Smoking History: Former smoker.  Quit 1977.  12-pack-year smoking history. Maintenance: None Pt of: Dr. Lamonte Sakai  10/05/2018  - Visit   78 year old male former smoker presenting to our office today for a 10-month follow-up.  Patient reports he has been doing okay.  Patient does have a few telephone notes from exacerbations requiring antibiotics.  Overall is been a stable 6 months for him.  He is maintained on Mucinex twice daily.  He uses flutter valve occasionally whenever he feels sick he does not use this regularly.  He reports his been sometime since he used his flutter valve.  He does have typically to episodes of coughing daily -once in the morning and once in the evening both of which he feels like he is able to adequately produce sputum and feels better afterwards.  Patient does endorse that he has some allergy-like symptoms today.  With some nasal congestion.  He is maintained on Allegra as well as Flonase.  Patient has already placed order for planned high-resolution CT for July/2020 to further evaluate bronchiectasis.   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  Imaging: No results found.    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) the mild to moderate cylindrical and varicoid bronchiectasis are associated tree-in-bud opacities in the mid lungs and are stable, mildly worsened postinfectious scarring in the lingula, stable postinfectious scarring in the right midlung, including stable near complete right middle lobe volume loss due to scarring   01/17/2014-respiratory sputum culture sinus did not grow any predominant organism, normal oropharyngeal flora >>>AFB negative      SINUSITIS, MAXILLARY, CHRONIC    Qualifier: Diagnosis of  By: Arnoldo Morale MD, John E       Chronic cough      No Known Allergies  Immunization History  Administered Date(s) Administered  . Influenza Split 05/03/2011, 04/04/2012, 04/02/2013, 04/02/2014  . Influenza Whole 05/03/2007, 05/13/2010  . Influenza, High Dose Seasonal PF 05/15/2015, 04/21/2016, 06/02/2017, 05/04/2018  . Influenza,inj,Quad PF,6+ Mos 03/20/2014  . Pneumococcal Conjugate-13 03/20/2014  . Pneumococcal Polysaccharide-23 08/02/2002, 08/02/2005, 02/25/2010  . Td 08/02/2005  . Tdap 07/27/2016  . Varicella 08/02/2009   Up-to-date  Past Medical History:  Diagnosis Date  . Allergic rhinitis   . Arthritis   . Bronchiectasis pulmologist-  dr Lamonte Sakai ([place])--  per lov note  03-22-2016 stable   intermittant--  per pt last bout yr ago 2016   . Eczema   . Elevated PSA   . History of colon polyps    1999  . History of concussion    2005 and 2010 with mild cerebral bleed resolved -no residual    . History of non-Hodgkin's lymphoma oncologist-  dr Benay Spice--  pt in remission   dx 03/ 2000  ,  Stage IA,  scalp/forehead ,  post chemo/radioation therapy  . Hyperlipidemia    diet controlled, no med  . Lower urinary tract symptoms (LUTS)   . Prostate cancer [facility]) urologist-  dr Alyson Ingles  oncologist-  dr Alen Blew and dr Tammi Klippel   cT2b N0 M0, Gleason 4+3,  PSA 4.2,  vol 35.0cc--  plan IMRT w/ gold seeds and ADT  . Wears glasses     Tobacco History: Social History   Tobacco Use  Smoking Status Former Smoker  . Packs/day: 1.00  . Years: 12.00  . Pack years: 12.00  . Types: Cigarettes  . Last attempt to quit: 08/03/1975  . Years since quitting: 43.2  Smokeless Tobacco Never Used   Counseling given: Not Answered  Continue to not smoke  Outpatient Encounter Medications as of 10/05/2018  Medication Sig  . Avanafil (STENDRA) 200 MG TABS Take 1 tablet by mouth once a week.  . Desoximetasone (TOPICORT) 0.25 % ointment Apply 1 application topically 2 (two) times daily.  Marland Kitchen doxycycline (VIBRA-TABS) 100 MG tablet Take 1 tablet (100 mg total) by mouth 2 (two) times daily.  . fexofenadine (ALLEGRA) 180 MG tablet Take 180 mg by mouth every morning.  . fluticasone (FLONASE) 50 MCG/ACT nasal spray SPRAY TWO SPRAYS IN EACH NOSTRIL DAILY AS NEEDED  . GuaiFENesin (MUCINEX PO) Take 1,200 mg by mouth 2 (two) times daily. Extended release  . ibuprofen (ADVIL,MOTRIN) 200 MG tablet Take 200 mg by mouth every 6 (six) hours as needed.   . Multiple Vitamins-Minerals (CENTRUM ADULTS PO) Take 1 tablet by mouth daily.  Marland Kitchen Respiratory Therapy Supplies (FLUTTER) DEVI 1 Device by Does not apply route as needed.   No facility-administered encounter medications on file as of 10/05/2018.      Review of Systems  Review of Systems  Constitutional: Negative for activity change, chills, fatigue, fever and unexpected weight change.  HENT: Positive for congestion, postnasal drip and rhinorrhea. Negative for sinus  pressure, sinus pain and sore throat.   Eyes: Negative.   Respiratory: Positive for cough (Baseline). Negative for shortness of breath and wheezing.   Cardiovascular: Negative for chest pain and palpitations.  Gastrointestinal: Negative for diarrhea, nausea and vomiting.  Endocrine: Negative.   Musculoskeletal: Negative.   Skin: Negative.   Neurological: Negative for dizziness and headaches.  Psychiatric/Behavioral: Negative.  Negative for dysphoric mood. The patient is not nervous/anxious.   All other systems reviewed and are negative.    Physical Exam  BP 132/74 (BP Location: Right Arm, Patient Position: Sitting, Cuff Size: Normal)   Pulse 61   Ht 5' 10.5" (1.791 m)   Wt 192 lb 6.4 oz (87.3 kg)   SpO2 96%   BMI 27.22 kg/m   Wt Readings from Last 5 Encounters:  10/05/18 192 lb 6.4 oz (87.3 kg)  06/14/18 186 lb 11.2 oz (84.7 kg)  04/07/18 186 lb 12.8 oz (84.7 kg)  03/13/18 187 lb (84.8 kg)  11/08/17 187 lb 6.4 oz (85 kg)    Physical Exam  Constitutional: He is oriented to person, place, and time  and well-developed, well-nourished, and in no distress. No distress.  HENT:  Head: Normocephalic and atraumatic.  Right Ear: Hearing, tympanic membrane, external ear and ear canal normal.  Left Ear: Hearing, tympanic membrane, external ear and ear canal normal.  Nose: Mucosal edema and rhinorrhea present. Right sinus exhibits no maxillary sinus tenderness and no frontal sinus tenderness. Left sinus exhibits no maxillary sinus tenderness and no frontal sinus tenderness.  Mouth/Throat: Uvula is midline and oropharynx is clear and moist. No oropharyngeal exudate.  Eyes: Pupils are equal, round, and reactive to light.  Neck: Normal range of motion. Neck supple.  Cardiovascular: Normal rate, regular rhythm and normal heart sounds.  Pulmonary/Chest: Effort normal and breath sounds normal. No accessory muscle usage. No respiratory distress. He has no decreased breath sounds. He has no  wheezes. He has no rhonchi. He has no rales.  +few scattered squeaks   Musculoskeletal: Normal range of motion.  Lymphadenopathy:    He has no cervical adenopathy.  Neurological: He is alert and oriented to person, place, and time. Gait normal.  Skin: Skin is warm and dry. He is not diaphoretic.  Psychiatric: Mood, memory, affect and judgment normal.  Nursing note and vitals reviewed.     Lab Results:  CBC    Component Value Date/Time   WBC 7.6 06/14/2018 0836   RBC 4.70 06/14/2018 0836   HGB 15.4 06/14/2018 0836   HCT 45.2 06/14/2018 0836   PLT 221.0 06/14/2018 0836   MCV 96.1 06/14/2018 0836   MCHC 34.2 06/14/2018 0836   RDW 12.9 06/14/2018 0836   LYMPHSABS 1.3 06/14/2018 0836   MONOABS 0.5 06/14/2018 0836   EOSABS 0.3 06/14/2018 0836   BASOSABS 0.1 06/14/2018 0836    BMET    Component Value Date/Time   NA 141 06/14/2018 0836   K 4.6 06/14/2018 0836   CL 105 06/14/2018 0836   CO2 30 06/14/2018 0836   GLUCOSE 106 (H) 06/14/2018 0836   BUN 17 06/14/2018 0836   CREATININE 0.97 06/14/2018 0836   CALCIUM 9.9 06/14/2018 0836   GFRNONAA 81.08 02/05/2010 0844   GFRAA 97 12/07/2007 1005    BNP No results found for: BNP  ProBNP No results found for: PROBNP    Assessment & Plan:     BRONCHIECTASIS Assessment: 78 year old male former smoker July/2018 high-resolution CT shows mild to moderate cylindrical and varicoid bronchiectasis associated with tree-in-bud opacities in mid lungs, suspected MAI Last respiratory sputum sample was 2015 which did not grow out any predominant organism, AFB negative Lungs clear to auscultation today with a few scattered squeaks Patient using flutter valve as needed whenever he is feeling ill Patient maintained on Mucinex twice daily  Plan: Continue with planned July/2020 CT to evaluate bronchiectasis Follow-up with Dr. Lamonte Sakai in August/2020 Start flutter valve use at least daily 10 breaths, can increase to 2 times daily 10  breaths each time if you feel clinical relief Continue Mucinex as prescribed Continue Flonase Continue Allegra Sputum sample cup provided today for the patient to take home >>> If patient feels clinically ill then he can produce a sputum sample cup and contact our office Continue daily physical activity such as racquetball Continue to eat whole nutritious foods Continue to hydrate well   Allergic rhinitis Assessment: Slight AR flare on exam today Maintained on Flonase and daily Allegra  Plan: Continue [place], NP 10/05/2018   This appointment was 30 min long with over 50% of  the time in direct face-to-face patient care, assessment, plan of care, and follow-up.

## 2018-10-05 ENCOUNTER — Encounter: Payer: Self-pay | Admitting: Pulmonary Disease

## 2018-10-05 ENCOUNTER — Ambulatory Visit: Payer: Medicare Other | Admitting: Pulmonary Disease

## 2018-10-05 VITALS — BP 132/74 | HR 61 | Ht 70.5 in | Wt 192.4 lb

## 2018-10-05 DIAGNOSIS — J301 Allergic rhinitis due to pollen: Secondary | ICD-10-CM | POA: Diagnosis not present

## 2018-10-05 DIAGNOSIS — J479 Bronchiectasis, uncomplicated: Secondary | ICD-10-CM | POA: Diagnosis not present

## 2018-10-05 NOTE — Assessment & Plan Note (Signed)
Assessment: Slight AR flare on exam today Maintained on Flonase and daily Allegra  Plan: Continue Allegra  Continue Flonase

## 2018-10-05 NOTE — Patient Instructions (Signed)
Bronchiectasis: This is the medical term which indicates that you have damage, dilated airways making you more susceptible to respiratory infection. Use a flutter valve 10 breaths twice a day or 4 to 5 breaths 4-5 times a day to help clear mucus out Let Eric Brock know if you have cough with change in mucus color or fevers or chills.  At that point you would need an antibiotic. Maintain a healthy nutritious diet, eating whole foods Take your medications as prescribed   Continue Mucinex daily  Continue Flonase as prescribed  Continue Allegra as prescribed  We will provide a sputum cup  >>> When you are feeling ill please cough up a sample of sputum and bring this to our lab within 3 hours, if you are unable to bring to the lab within 3 hours and please patient refrigerator and coordinate with our office when to bring in the sample  Continue forward with planned CT high-res July/2020  Let Eric Brock plan follow-up with Dr. Lamonte Sakai in August/2020 to review CT  Present to our office sooner if you are having acute symptoms or concerns   It is flu season:   >>> Best ways to protect herself from the flu: Receive the yearly flu vaccine, practice good hand hygiene washing with soap and also using hand sanitizer when available, eat a nutritious meals, get adequate rest, hydrate appropriately   Please contact the office if your symptoms worsen or you have concerns that you are not improving.   Thank you for choosing [facility] for your healthcare, and for allowing Eric Brock to partner with you on your healthcare journey. I am thankful to be able to provide care to you today.   Wyn Quaker FNP-C       Bronchiectasis  Bronchiectasis is a condition in which the airways in the lungs (bronchi) are damaged and widened. The condition makes it hard for the lungs to get rid of mucus, and it causes mucus to gather in the bronchi. This condition often leads to lung infections, which can make the condition  worse. What are the causes? You can be born with this condition or you can develop it later in life. Common causes of this condition include:  Cystic fibrosis.  Repeated lung infections, such as pneumonia or tuberculosis.  An object or other blockage in the lungs.  Breathing in fluid, food, or other objects (aspiration).  A problem with the immune system and lung structure that is present at birth (congenital). Sometimes the cause is not known. What are the signs or symptoms? Common symptoms of this condition include:  A daily cough that brings up mucus and lasts for more than 3 weeks.  Lung infections that happen often.  Shortness of breath and wheezing.  Weakness and fatigue. How is this diagnosed? This condition is diagnosed with tests, such as:  Chest X-rays or CT scans. These are done to check for changes in the lungs.  Breathing tests. These are done to check how well your lungs are working.  A test of a sample of your saliva (sputum culture). This test is done to check for infection.  Blood tests and other tests. These are done to check for related diseases or causes. How is this treated? Treatment for this condition depends on the severity of the illness and its cause. Treatment may include:  Medicines that loosen mucus so it can be coughed up (expectorants).  Medicines that relax the muscles of the bronchi (bronchodilators).  Antibiotic medicines to prevent or  treat infection.  Physical therapy to help clear mucus from the lungs. Techniques may include: ? Postural drainage. This is when you sit or lie in certain positions so that mucus can drain by gravity. ? Chest percussion. This involves tapping the chest or back with a cupped hand. ? Chest vibration. For this therapy, a hand or special equipment vibrates your chest and back.  Surgery to remove the affected part of the lung. This may be done in severe cases. Follow these instructions at  home: Medicines  Take over-the-counter and prescription medicines only as told by your health care provider.  If you were prescribed an antibiotic medicine, take it as told by your health care provider. Do not stop taking the antibiotic even if you start to feel better.  Avoid taking sedatives and antihistamines unless your health care provider tells you to take them. These medicines tend to thicken the mucus in the lungs. Managing symptoms  Perform breathing exercises or techniques to clear your lungs as told by your health care provider.  Consider using a cold steam vaporizer or humidifier in your room or home to help loosen secretions.  If you have a cough that gets worse at night, try sleeping in a semi-upright position. General instructions  Get plenty of rest.  Drink enough fluid to keep your urine clear or pale yellow.  Stay inside when pollution and ozone levels are high.  Stay up to date with vaccinations and immunizations.  Avoid cigarette smoke and other lung irritants.  Do not use any products that contain nicotine or tobacco, such as cigarettes and e-cigarettes. If you need help quitting, ask your health care provider.  Keep all follow-up visits as told by your health care provider. This is important. Contact a health care provider if:  You cough up more sputum than before and the sputum is yellow or green in color.  You have a fever.  You cannot control your cough and are losing sleep. Get help right away if:  You cough up blood.  You have chest pain.  You have increasing shortness of breath.  You have pain that gets worse or is not controlled with medicines.  You have a fever and your symptoms suddenly get worse. Summary  Bronchiectasis is a condition in which the airways in the lungs (bronchi) are damaged and widened. The condition makes it hard for the lungs to get rid of mucus, and it causes mucus to gather in the bronchi.  Treatment usually  includes therapy to help clear mucus from the lungs.  Stay up to date with vaccinations and immunizations. This information is not intended to replace advice given to you by your health care provider. Make sure you discuss any questions you have with your health care provider. Document Released: 05/16/2007 Document Revised: 08/23/2016 Document Reviewed: 08/23/2016 Elsevier Interactive Patient Education  2019 [employer].

## 2018-10-05 NOTE — Assessment & Plan Note (Addendum)
Assessment: 78 year old male former smoker July/2018 high-resolution CT shows mild to moderate cylindrical and varicoid bronchiectasis associated with tree-in-bud opacities in mid lungs, suspected MAI Last respiratory sputum sample was 2015 which did not grow out any predominant organism, AFB negative Lungs clear to auscultation today with a few scattered squeaks Patient using flutter valve as needed whenever he is feeling ill Patient maintained on Mucinex twice daily  Plan: Continue with planned July/2020 CT to evaluate bronchiectasis Follow-up with Dr. Lamonte Sakai in August/2020 Start flutter valve use at least daily 10 breaths, can increase to 2 times daily 10 breaths each time if you feel clinical relief Continue Mucinex as prescribed Continue Flonase Continue Allegra Sputum sample cup provided today for the patient to take home >>> If patient feels clinically ill then he can produce a sputum sample cup and contact our office Continue daily physical activity such as racquetball Continue to eat whole nutritious foods Continue to hydrate well

## 2018-10-17 ENCOUNTER — Telehealth: Payer: Self-pay | Admitting: Family Medicine

## 2018-10-17 NOTE — Telephone Encounter (Signed)
Copied from [place] [phone] [phone]. Topic: Quick Communication - Rx Refill/Question >> Oct 17, 2018 10:05 AM Bea Graff, NT wrote: Medication: Desoximetasone (TOPICORT) 0.25 % ointment  Has the patient contacted their pharmacy? Yes.   (Agent: If no, request that the patient contact the pharmacy for the refill.) (Agent: If yes, when and what did the pharmacy advise?)  Preferred Pharmacy (with phone number or street name): [facility] [address], [place] - [address] [phone] (Phone) [phone] (Fax)    Agent: Please be advised that RX refills may take up to 3 business days. We ask that you follow-up with your pharmacy.

## 2018-10-18 ENCOUNTER — Other Ambulatory Visit: Payer: Self-pay

## 2018-10-18 MED ORDER — DESOXIMETASONE 0.25 % EX OINT: 1.0000 "application " | TOPICAL_OINTMENT | Freq: Two times a day (BID) | CUTANEOUS | 2 refills | Status: DC

## 2018-10-18 NOTE — Telephone Encounter (Signed)
Last OV 06/14/18, No future OV  Last filled in 2017  OK to fill?

## 2018-10-18 NOTE — Telephone Encounter (Signed)
This prescription has been sent to the pharmacy for the patient. 

## 2018-10-18 NOTE — Telephone Encounter (Signed)
Refill OK

## 2018-10-31 ENCOUNTER — Telehealth: Payer: Self-pay | Admitting: Family Medicine

## 2018-10-31 NOTE — Telephone Encounter (Signed)
Pt left vm stating he had not heard from office or pharmacy about his desoximetasone. Looking in epic this was called in on 10/18/18. If someone could reach out to pt and give him an update

## 2018-10-31 NOTE — Telephone Encounter (Signed)
Called patient and spoke to his wife and let her know that this prescription was sent to the [place] at [facility] on 10/18/18. Advised them to contact pharmacy for refill. If any complications to let us know and I can resend the prescription. Patients wife verbalized an understanding.

## 2018-12-28 ENCOUNTER — Encounter: Payer: Self-pay | Admitting: Internal Medicine

## 2018-12-28 ENCOUNTER — Ambulatory Visit (INDEPENDENT_AMBULATORY_CARE_PROVIDER_SITE_OTHER): Payer: Medicare Other | Admitting: Internal Medicine

## 2018-12-28 ENCOUNTER — Other Ambulatory Visit: Payer: Self-pay

## 2018-12-28 DIAGNOSIS — B309 Viral conjunctivitis, unspecified: Secondary | ICD-10-CM | POA: Diagnosis not present

## 2018-12-28 DIAGNOSIS — R6889 Other general symptoms and signs: Secondary | ICD-10-CM

## 2018-12-28 NOTE — Progress Notes (Signed)
Virtual Visit via Video Note  I connected with@ on 12/28/18 at 11:00 AM EDT by a video enabled telemedicine application and verified that I am speaking with the correct person using two identifiers. Location patient: home Location provider:home office Persons participating in the virtual visit: patient, provider  [place] national recommendations  regarding COVID 19 pandemic   video visit is advised over in office visit for this patient.  Patient aware  of the limitations of evaluation and management by telemedicine and  availability of in person appointments. and agreed to proceed.   HPI: Eric Brock presents for video visit  For 1-2 days of right  Eye itching   ? Some clear tearing  No vision change pain lower  Lid more red went to [facility] and bought  Ketotifen drops 3 x per day and some  Relief but still itching    Taking zyrtec   Has some allergy s  Underlying bronchiectases.  No pus or lid swelling  No eye diseaes neededing intervnetion.  No uri cold sx. Works outside a good bit   No specific FB otherwise   ROS: See pertinent positives and negatives per HPI. No current  Coughing  Systemic sx  Wife has allergy sx   With eye sx but no infection contact  Past Medical History:  Diagnosis Date  . Allergic rhinitis   . Arthritis   . Bronchiectasis pulmologist-  dr Lamonte Sakai ([place])--  per lov note 03-22-2016 stable   intermittant--  per pt last bout yr ago 2016   . Eczema   . Elevated PSA   . History of colon polyps    1999  . History of concussion    2005 and 2010 with mild cerebral bleed resolved -no residual  . History of non-Hodgkin's lymphoma oncologist-  dr Benay Spice--  pt in remission   dx 03/ 2000  ,  Stage IA,  scalp/forehead ,  post chemo/radioation therapy  . Hyperlipidemia    diet controlled, no med  . Lower urinary tract symptoms (LUTS)   . Prostate cancer [facility]) urologist-  dr Alyson Ingles  oncologist-  dr Alen Blew and dr Tammi Klippel   cT2b N0 M0, Gleason 4+3,  PSA 4.2,  vol  35.0cc--  plan IMRT w/ gold seeds and ADT  . Wears glasses     Past Surgical History:  Procedure Laterality Date  . CATARACT EXTRACTION W/ INTRAOCULAR LENS  IMPLANT, BILATERAL  2013  . CHOLECYSTECTOMY  1994  . COLONOSCOPY  last one 07-14-2015  . GOLD SEED IMPLANT N/A 04/16/2016   Procedure: GOLD SEED IMPLANT;  Surgeon: Cleon Gustin, MD;  Location: [facility];  Service: Urology;  Laterality: N/A;  . PROSTATE BIOPSY N/A 01/12/2016   Procedure: BIOPSY TRANSRECTAL ULTRASONIC PROSTATE (TUBP);  Surgeon: Cleon Gustin, MD;  Location: [facility];  Service: Urology;  Laterality: N/A;  . UMBILICAL HERNIA REPAIR  1992    Family History  Problem Relation Age of Onset  . Heart disease Father   . Cancer Brother        prostate  . Brain cancer Mother   . Colon cancer Neg Hx   . Colon polyps Neg Hx   . Esophageal cancer Neg Hx   . Rectal cancer Neg Hx   . Stomach cancer Neg Hx     Social History   Tobacco Use  . Smoking status: Former Smoker    Packs/day: 1.00    Years: 12.00    Pack years: 12.00  Types: Cigarettes    Last attempt to quit: 08/03/1975    Years since quitting: 43.4  . Smokeless tobacco: Never Used  Substance Use Topics  . Alcohol use: Yes    Alcohol/week: 7.0 standard drinks    Types: 7 Glasses of wine per week    Comment: 1 glass of wine daily  . Drug use: No      Current Outpatient Medications:  .  Avanafil (STENDRA) 200 MG TABS, Take 1 tablet by mouth once a week., Disp: , Rfl:  .  Desoximetasone (TOPICORT) 0.25 % ointment, Apply 1 application topically 2 (two) times daily., Disp: 30 g, Rfl: 2 .  doxycycline (VIBRA-TABS) 100 MG tablet, Take 1 tablet (100 mg total) by mouth 2 (two) times daily., Disp: 14 tablet, Rfl: 0 .  fexofenadine (ALLEGRA) 180 MG tablet, Take 180 mg by mouth every morning., Disp: , Rfl:  .  fluticasone (FLONASE) 50 MCG/ACT nasal spray, SPRAY TWO SPRAYS IN EACH NOSTRIL DAILY AS NEEDED, Disp: 16 g,  Rfl: 2 .  GuaiFENesin (MUCINEX PO), Take 1,200 mg by mouth 2 (two) times daily. Extended release, Disp: , Rfl:  .  ibuprofen (ADVIL,MOTRIN) 200 MG tablet, Take 200 mg by mouth every 6 (six) hours as needed. , Disp: , Rfl:  .  Multiple Vitamins-Minerals (CENTRUM ADULTS PO), Take 1 tablet by mouth daily., Disp: , Rfl:  .  Respiratory Therapy Supplies (FLUTTER) DEVI, 1 Device by Does not apply route as needed., Disp: 1 each, Rfl: 0  EXAM: BP Readings from Last 3 Encounters:  10/05/18 132/74  06/14/18 128/76  04/07/18 138/72    VITALS per patient if applicable:  GENERAL: alert, oriented, appears well and in no acute distress  HEENT: atraumatic, bulbar conjunttiva clear slight rendess lower inner lid  Right no edema  Nl gaze , no obvious abnormalities on inspection of external nose and ears  NECK: normal movements of the head and neck  LUNGS: on inspection no signs of respiratory distress, breathing rate appears normal, no obvious gross SOB, gasping or wheezing  CV: no obvious cyanosis PSYCH/NEURO: pleasant and cooperative, no obvious depression or anxiety, speech and thought processing grossly intact   ASSESSMENT AND PLAN:  Discussed the following assessment and plan:  Itchy eye - poss allergic vs other see text  Unilateral eye itching  Poss allergic vs irritative   No sign bacterial infection  Stay on the ketotofitin  Add cool compresses  And eye moisturizer  Also    Expectant management.  If fever pain vision changes infection sx etc contact us  Ok to try anithistamine bid   In interim if helps  Counseled.   Expectant management and discussion of plan and treatment with opportunity to ask questions and all were answered. The patient agreed with the plan and demonstrated an understanding of the instructions.   Advised to call back or seek an in-person evaluation if worsening  or having  further concerns    Shanon Ace, MD

## 2019-01-05 DIAGNOSIS — B309 Viral conjunctivitis, unspecified: Secondary | ICD-10-CM | POA: Diagnosis not present

## 2019-01-19 DIAGNOSIS — L239 Allergic contact dermatitis, unspecified cause: Secondary | ICD-10-CM | POA: Diagnosis not present

## 2019-01-23 DIAGNOSIS — L239 Allergic contact dermatitis, unspecified cause: Secondary | ICD-10-CM | POA: Diagnosis not present

## 2019-01-30 DIAGNOSIS — L239 Allergic contact dermatitis, unspecified cause: Secondary | ICD-10-CM | POA: Diagnosis not present

## 2019-02-20 DIAGNOSIS — L239 Allergic contact dermatitis, unspecified cause: Secondary | ICD-10-CM | POA: Diagnosis not present

## 2019-03-01 ENCOUNTER — Other Ambulatory Visit: Payer: Self-pay

## 2019-03-01 ENCOUNTER — Ambulatory Visit (INDEPENDENT_AMBULATORY_CARE_PROVIDER_SITE_OTHER)
Admission: RE | Admit: 2019-03-01 | Discharge: 2019-03-01 | Disposition: A | Discharge status: Home / Self Care | Payer: Medicare Other | Source: Ambulatory Visit | Attending: Emergency Medicine | Admitting: Emergency Medicine

## 2019-03-01 DIAGNOSIS — J9811 Atelectasis: Secondary | ICD-10-CM | POA: Diagnosis not present

## 2019-03-01 DIAGNOSIS — J471 Bronchiectasis with (acute) exacerbation: Secondary | ICD-10-CM | POA: Diagnosis not present

## 2019-03-01 DIAGNOSIS — J479 Bronchiectasis, uncomplicated: Secondary | ICD-10-CM | POA: Diagnosis not present

## 2019-03-27 ENCOUNTER — Telehealth: Payer: Self-pay | Admitting: Emergency Medicine

## 2019-03-27 NOTE — Telephone Encounter (Signed)
RB is actually in the office today, so I will be routing this message to him.  RB, please advise with your recommendations for this pt. Thank you.

## 2019-03-27 NOTE — Telephone Encounter (Signed)
Primary Pulmonologist: RB Last office visit and with whom: 10/05/2018 w/ BPM What do we see them for (pulmonary problems): Bronchiectasis, seasonal allergic rhinitis due to pollen  Reason for call: Pt states he feels as if he is approaching an bronchiectasis exacerbation. He reports feeling lethargic, having a nonproductive cough with occasional clear mucus and is waking up in the middle of the night cough. Medication wise he is taking Mucinex and Flonase as directed in addition to Equate OTC cold & flu medication. He denies fever/chills/muscle aches and states he just feels "tense" and "like he feels when he gets the flu."    In the last month, have you been in contact with someone who was confirmed or suspected to have Conoravirus / COVID-19?  No  Do you have any of the following symptoms developed in the last 30 days? Fever: No Cough: Yes, nonproductive w/ occasional clear mucus Shortness of breath: No  When did your symptoms start?  1 week ago  If the patient has a fever, what is the last reading?  (use n/a if patient denies fever)  N/A . IF THE PATIENT STATES THEY DO NOT OWN A THERMOMETER, THEY MUST GO AND PURCHASE ONE When did the fever start?: N/A Have you taken any medication to suppress a fever (ie Ibuprofen, Aleve, Tylenol)?: N/A  I let pt know that RB is not currently in the office today; therefore, I would get this message routed to [place], who he saw last. Pt expressed understanding.   Aaron Edelman, I noticed in your last office notes you wanted pt to follow-up w/ RB regarding his CT scan performed on 03/01/2019 in August 2020. It is now 03/27/2019 and pt does not have a prior or future appt to go over these results. Let me know if you would like to create a telephone or MyChart video visit for this with you or RB depending on when his schedule is established again. Otherwise, please advise with your recommendations for this pt. Thank you.

## 2019-03-27 NOTE — Telephone Encounter (Signed)
Called the patient and made him aware of Dr. Agustina Caroli recommendation. The patient has been scheduled for a telephone visit with Dr. Lamonte Sakai tomorrow at 2:30. Nothing further needed at this time.

## 2019-03-27 NOTE — Telephone Encounter (Signed)
Thank you - I think he needs a dedicated phone visit tomorrow to discuss his sx, decide whether he needs SARS CoV2 testing. Difficult to characterize this as a straightforward bronchiectasis exacerbation since he is not bringing up mucous. He may need more than just standard abx treatment.

## 2019-03-28 ENCOUNTER — Other Ambulatory Visit: Payer: Self-pay | Admitting: *Deleted

## 2019-03-28 ENCOUNTER — Other Ambulatory Visit: Payer: Self-pay

## 2019-03-28 ENCOUNTER — Encounter: Payer: Self-pay | Admitting: Emergency Medicine

## 2019-03-28 ENCOUNTER — Ambulatory Visit (INDEPENDENT_AMBULATORY_CARE_PROVIDER_SITE_OTHER): Payer: Medicare Other | Admitting: Emergency Medicine

## 2019-03-28 DIAGNOSIS — R6889 Other general symptoms and signs: Secondary | ICD-10-CM

## 2019-03-28 DIAGNOSIS — J09X2 Influenza due to identified novel influenza A virus with other respiratory manifestations: Secondary | ICD-10-CM

## 2019-03-28 MED ORDER — DOXYCYCLINE HYCLATE 100 MG PO TABS: 100.0000 mg | ORAL_TABLET | Freq: Two times a day (BID) | ORAL | 0 refills | Status: DC

## 2019-03-28 NOTE — Progress Notes (Signed)
Virtual Visit via Telephone Note  I connected with Eric Brock on 03/28/19 at  2:30 PM EDT by telephone and verified that I am speaking with the correct person using two identifiers.  Location: Patient: Home Provider: Office   I discussed the limitations, risks, security and privacy concerns of performing an evaluation and management service by telephone and the availability of in person appointments. I also discussed with the patient that there may be a patient responsible charge related to this service. The patient expressed understanding and agreed to proceed.   History of Present Illness: 78 year old man followed for bronchiectasis, allergic rhinitis, chronic cough.  His most recent chest CT was 03/01/2019, reviewed by me, which was overall stable with some medial right middle lobe and lingular bronchiectasis with atelectatic change.  Question mucus impaction as well. He has some green color to his sputum.    Observations/Objective: Eric Brock has been experiencing congestion, nasal drainage beginning 1 week ago. Started to have cough. He has used Nyquil. He hasn't had a fever. No sick contacts. He has had some aches, fatigue. He does have a decent appetite.   He was treated for pink-eye in late June, required systemic steroids for 4 weeks.  He has been in isolation, wears mask, etc.  Currently managed on Zyrtec, Flonase which he uses only as needed, flutter valve for which he uses only occasionally.   Assessment and Plan: Increased congestion, some low-grade viral symptoms.  Unclear to me whether this may be a viral process versus increased effects of allergy disease.  He is showing signs consistent with his early exacerbations. -I think he needs COVID-19 testing now.  We discussed the quarantine and isolation rules until his results come back -Restart his Flonase nasal spray every day until this illness is completed -Uses flutter valve -Continue Zyrtec -Doxycycline 100 twice  daily for 7 days.  Follow Up Instructions: 2 months    I discussed the assessment and treatment plan with the patient. The patient was provided an opportunity to ask questions and all were answered. The patient agreed with the plan and demonstrated an understanding of the instructions.   The patient was advised to call back or seek an in-person evaluation if the symptoms worsen or if the condition fails to improve as anticipated.  I provided 22 minutes of non-face-to-face time during this encounter.   Collene Gobble, MD

## 2019-03-29 ENCOUNTER — Telehealth: Payer: Self-pay | Admitting: Emergency Medicine

## 2019-03-29 LAB — NOVEL CORONAVIRUS, NAA: SARS-CoV-2, NAA: NOT DETECTED

## 2019-03-29 NOTE — Telephone Encounter (Signed)
lmtcb for pt.  

## 2019-03-29 NOTE — Telephone Encounter (Signed)
Patient is returning phone call.  Patient phone number is [phone] home or [phone] cell.

## 2019-03-29 NOTE — Telephone Encounter (Signed)
Spoke with patient. He wanted to know if his COVID test results were back since he had received a MyChart message this morning. Advised him that his results came back negative. He verbalized understanding.   However, he wanted to know that since the results were negative, does this change his plan.   Plan made during his last visit:   Increased congestion, some low-grade viral symptoms.  Unclear to me whether this may be a viral process versus increased effects of allergy disease.  He is showing signs consistent with his early exacerbations. -I think he needs COVID-19 testing now.  We discussed the quarantine and isolation rules until his results come back -Restart his Flonase nasal spray every day until this illness is completed -Uses flutter valve -Continue Zyrtec -Doxycycline 100 twice daily for 7 days.  RB, please advise. Thanks!

## 2019-03-29 NOTE — Telephone Encounter (Signed)
Called and spoke to pt. Informed him of the recs per RB. Pt verbalized understanding and denied any further questions or concerns at this time.   

## 2019-03-29 NOTE — Telephone Encounter (Signed)
No, I think he should still take the doxycycline as we had planned. He doesn't have to worry about quarantining.

## 2019-05-21 DIAGNOSIS — L239 Allergic contact dermatitis, unspecified cause: Secondary | ICD-10-CM | POA: Diagnosis not present

## 2019-05-21 DIAGNOSIS — H18519 Endothelial corneal dystrophy, unspecified eye: Secondary | ICD-10-CM | POA: Diagnosis not present

## 2019-05-28 DIAGNOSIS — J3081 Allergic rhinitis due to animal (cat) (dog) hair and dander: Secondary | ICD-10-CM | POA: Diagnosis not present

## 2019-05-28 DIAGNOSIS — H1045 Other chronic allergic conjunctivitis: Secondary | ICD-10-CM | POA: Diagnosis not present

## 2019-05-28 DIAGNOSIS — J3089 Other allergic rhinitis: Secondary | ICD-10-CM | POA: Diagnosis not present

## 2019-05-28 DIAGNOSIS — J301 Allergic rhinitis due to pollen: Secondary | ICD-10-CM | POA: Diagnosis not present

## 2019-05-31 ENCOUNTER — Ambulatory Visit: Payer: Medicare Other

## 2019-06-05 DIAGNOSIS — J3081 Allergic rhinitis due to animal (cat) (dog) hair and dander: Secondary | ICD-10-CM | POA: Diagnosis not present

## 2019-06-05 DIAGNOSIS — J301 Allergic rhinitis due to pollen: Secondary | ICD-10-CM | POA: Diagnosis not present

## 2019-06-05 DIAGNOSIS — J3089 Other allergic rhinitis: Secondary | ICD-10-CM | POA: Diagnosis not present

## 2019-07-24 ENCOUNTER — Encounter: Payer: Self-pay | Admitting: Family Medicine

## 2019-08-08 ENCOUNTER — Encounter: Payer: Self-pay | Admitting: Family Medicine

## 2019-08-10 ENCOUNTER — Encounter: Payer: Self-pay | Admitting: Family Medicine

## 2019-08-10 ENCOUNTER — Ambulatory Visit (INDEPENDENT_AMBULATORY_CARE_PROVIDER_SITE_OTHER): Payer: Medicare Other | Admitting: Family Medicine

## 2019-08-10 ENCOUNTER — Other Ambulatory Visit: Payer: Self-pay

## 2019-08-10 VITALS — BP 120/64 | HR 57 | Temp 98.0°F | Ht 70.5 in | Wt 184.7 lb

## 2019-08-10 DIAGNOSIS — L570 Actinic keratosis: Secondary | ICD-10-CM

## 2019-08-10 NOTE — Patient Instructions (Signed)
Actinic Keratosis °An actinic keratosis is a precancerous growth on the skin. If there is more than one growth, the condition is called actinic keratoses. Actinic keratoses appear most often on areas of skin that get a lot of sun exposure, including the scalp, face, ears, lips, upper back, forearms, and the backs of the hands. °If left untreated, these growths may develop into a skin cancer called squamous cell carcinoma. It is important to have all these growths checked by a health care provider to determine the best treatment approach. °What are the causes? °Actinic keratoses are caused by getting too much ultraviolet (UV) radiation from the sun or other UV light sources. °What increases the risk? °You are more likely to develop this condition if you: °· Have light-colored skin and blue eyes. °· Have blond or red hair. °· Spend a lot of time in the sun. °· Do not protect your skin from the sun when outdoors. °· Are an older person. The risk of developing an actinic keratosis increases with age. °What are the signs or symptoms? °Actinic keratoses feel like scaly, rough spots of skin. Symptoms of this condition include growths that may: °· Be as small as a pinhead or as big as a quarter. °· Itch, hurt, or feel sensitive. °· Be skin-colored, light tan, dark tan, pink, or a combination of any of these colors. In most cases, the growths become red. °· Have a small piece of pink or gray skin (skin tag) growing from them. °It may be easier to notice actinic keratoses by feeling them, rather than seeing them. Sometimes, actinic keratoses disappear, but many reappear a few days to a few weeks later. °How is this diagnosed? °This condition is usually diagnosed with a physical exam. °· A tissue sample may be removed from the actinic keratosis and examined under a microscope (biopsy). °How is this treated? °If needed, this condition may be treated by: °· Scraping off the actinic keratosis (curettage). °· Freezing the actinic  keratosis with liquid nitrogen (cryosurgery). This causes the growth to eventually fall off the skin. °· Applying medicated creams or gels to destroy the cells in the growth. °· Applying chemicals to the actinic keratosis to make the outer layers of skin peel off (chemical peel). °· Using photodynamic therapy. In this procedure, medicated cream is applied to the actinic keratosis. This cream increases your skin's sensitivity to light. Then, a strong light is aimed at the actinic keratosis to destroy cells in the growth. °Follow these instructions at home: °Skin care °· Apply cool, wet cloths (cool compresses) to the affected areas. °· Do not scratch your skin. °· Check your skin regularly for any growths, especially growths that: °? Start to itch or bleed. °? Change in size, shape, or color. °Caring for the treated area °· Keep the treated area clean and dry as told by your health care provider. °· Do not apply any medicine, cream, or lotion to the treated area unless your health care provider tells you to do that. °· Do not pick at blisters or try to break them open. This can cause infection and scarring. °· If you have red or irritated skin after treatment, follow instructions from your health care provider about how to take care of the treated area. Make sure you: °? Wash your hands with soap and water before you change your bandage (dressing). If soap and water are not available, use hand sanitizer. °? Change your dressing as told by your health care provider. °· If   you have red or irritated skin after treatment, check your treated area every day for signs of infection. Check for: °? Redness, swelling, or pain. °? Fluid or blood. °? Warmth. °? Pus or a bad smell. °General instructions °· Take or apply over-the-counter and prescription medicines only as told by your health care provider. °· Return to your normal activities as told by your health care provider. Ask your health care provider what activities are  safe for you. °· Have a skin exam done every year by a health care provider who is a skin specialist (dermatologist). °· Keep all follow-up visits as told by your health care provider. This is important. °Lifestyle °· Do not use any products that contain nicotine or tobacco, such as cigarettes and e-cigarettes. If you need help quitting, ask your health care provider. °· Take steps to protect your skin from the sun. °? Try to avoid the sun between 10:00 a.m. and 4:00 p.m. This is when the UV light is the strongest. °? Use a sunscreen or sunblock with SPF 30 (sun protection factor 30) or greater. °? Apply sunscreen before you are exposed to sunlight and reapply as often as directed by the instructions on the sunscreen container. °? Always wear sunglasses that have UV protection, and always wear a hat and clothing to protect your skin from sunlight. °? When possible, avoid medicines that increase your sensitivity to sunlight. °? Do not use tanning beds or other indoor tanning devices. °Contact a health care provider if: °· You notice any changes or new growths on your skin. °· You have swelling, pain, or more redness around your treated area. °· You have fluid or blood coming from your treated area. °· Your treated area feels warm to the touch. °· You have pus or a bad smell coming from your treated area. °· You have a fever. °· You have a blister that becomes large and painful. °Summary °· An actinic keratosis is a precancerous growth on the skin. If there is more than one growth, the condition is called actinic keratoses. In some cases, if left untreated, these growths can develop into skin cancer. °· Check your skin regularly for any growths, especially growths that start to itch or bleed, or change in size, shape, or color. °· Take steps to protect your skin from the sun. °· Contact a health care provider if you notice any changes or new growths on your skin. °· Keep all follow-up visits as told by your health  care provider. This is important. °This information is not intended to replace advice given to you by your health care provider. Make sure you discuss any questions you have with your health care provider. °Document Revised: 11/29/2017 Document Reviewed: 11/29/2017 °Elsevier Patient Education © 2020 [employer] ° °

## 2019-08-10 NOTE — Telephone Encounter (Signed)
Spoke with the pt and informed him of the message below.  Appt scheduled for today to arrive at 1:45pm.

## 2019-08-10 NOTE — Progress Notes (Signed)
Subjective:     Patient ID: Eric Brock, male   DOB: 1940/08/21, 79 y.o.   MRN: [id number]  HPI   Eric Brock had sent a message earlier this week that he had a "crusty spot "left scalp about 1/8 of an inch diameter.  He said this was somewhat sensitive and there was slight discomfort.  This has been noted about 6 months ago.  No bleeding.  Does use occasional sunblock but not consistently.  He also has a slightly scaly area near the mid forehead as well.  Given the fact this has been present for several months we recommended an office evaluation to further assess  Past Medical History:  Diagnosis Date  . Allergic rhinitis   . Arthritis   . Bronchiectasis pulmologist-  dr Lamonte Sakai ([place])--  per lov note 03-22-2016 stable   intermittant--  per pt last bout yr ago 2016   . Eczema   . Elevated PSA   . History of colon polyps    1999  . History of concussion    2005 and 2010 with mild cerebral bleed resolved -no residual  . History of non-Hodgkin's lymphoma oncologist-  dr Benay Spice--  pt in remission   dx 03/ 2000  ,  Stage IA,  scalp/forehead ,  post chemo/radioation therapy  . Hyperlipidemia    diet controlled, no med  . Lower urinary tract symptoms (LUTS)   . Prostate cancer [facility]) urologist-  dr Alyson Ingles  oncologist-  dr Alen Blew and dr Tammi Klippel   cT2b N0 M0, Gleason 4+3,  PSA 4.2,  vol 35.0cc--  plan IMRT w/ gold seeds and ADT  . Wears glasses    Past Surgical History:  Procedure Laterality Date  . CATARACT EXTRACTION W/ INTRAOCULAR LENS  IMPLANT, BILATERAL  2013  . CHOLECYSTECTOMY  1994  . COLONOSCOPY  last one 07-14-2015  . GOLD SEED IMPLANT N/A 04/16/2016   Procedure: GOLD SEED IMPLANT;  Surgeon: Cleon Gustin, MD;  Location: [facility];  Service: Urology;  Laterality: N/A;  . PROSTATE BIOPSY N/A 01/12/2016   Procedure: BIOPSY TRANSRECTAL ULTRASONIC PROSTATE (TUBP);  Surgeon: Cleon Gustin, MD;  Location: [facility];  Service: Urology;   Laterality: N/A;  . [place]    reports that he quit smoking about 44 years ago. His smoking use included cigarettes. He has a 12.00 pack-year smoking history. He has never used smokeless tobacco. He reports current alcohol use of about 7.0 standard drinks of alcohol per week. He reports that he does not use drugs. family history includes Brain cancer in his mother; Cancer in his brother; Heart disease in his father. Allergies  Allergen Reactions  . Pollen Extract-Tree Extract [Pollen Extract]     Weed allergies also     Review of Systems  Constitutional: Negative for appetite change and unexpected weight change.  Respiratory: Negative for cough and shortness of breath.   Cardiovascular: Negative for chest pain.       Objective:   Physical Exam Vitals reviewed.  Constitutional:      Appearance: Normal appearance.  Cardiovascular:     Rate and Rhythm: Normal rate and regular rhythm.  Pulmonary:     Effort: Pulmonary effort is normal.     Breath sounds: Normal breath sounds.  Skin:    Comments: On the left parietal scalp he has approximately 2 mm elevated hyperkeratotic nonulcerative skin lesion.  Near the mid frontal area he has area which is about 3 x 4  cm with slightly erythematous base and scaly surface with some whitish scale  Neurological:     Mental Status: He is alert.        Assessment:     Actinic keratoses involving the scalp.  We explained that these can be precancerous and can lead to squamous cell.    Plan:     -We discussed risk and benefits of treatment with liquid nitrogen.  We discussed risks including blistering, redness, pain, low risk for scarring.  Patient consented.  These were treated with liquid nitrogen without difficulty.  Keep clean with soap and water.  Touch base if these or not resolving next couple weeks  -Also discussed sun protection with good hat and/ or sunblock  Eulas Post MD [facility] at  [facility]

## 2019-08-16 ENCOUNTER — Ambulatory Visit: Payer: Medicare Other | Attending: Internal Medicine

## 2019-08-16 DIAGNOSIS — R6889 Other general symptoms and signs: Secondary | ICD-10-CM | POA: Diagnosis not present

## 2019-08-16 DIAGNOSIS — Z23 Encounter for immunization: Secondary | ICD-10-CM | POA: Insufficient documentation

## 2019-08-16 NOTE — Progress Notes (Signed)
   Covid-19 Vaccination Clinic  Name:  [name]    MRN: [id number] DOB: 02-Oct-1940  08/16/2019  Eric Brock was observed post Covid-19 immunization for 15 minutes without incidence. He was provided with Vaccine Information Sheet and instruction to access the V-Safe system.   Eric Brock was instructed to call 911 with any severe reactions post vaccine: Marland Kitchen Difficulty breathing  . Swelling of your face and throat  . A fast heartbeat  . A bad rash all over your body  . Dizziness and weakness    Immunizations Administered    Name Date Dose VIS Date Route   Pfizer COVID-19 Vaccine 08/16/2019 12:05 PM 0.3 mL 07/13/2019 Intramuscular   Manufacturer: [place]   Lot: [id number]   [place]: [id number]

## 2019-09-06 ENCOUNTER — Ambulatory Visit: Payer: Medicare Other | Attending: Internal Medicine

## 2019-09-06 DIAGNOSIS — Z23 Encounter for immunization: Secondary | ICD-10-CM

## 2019-09-06 NOTE — Progress Notes (Signed)
   Covid-19 Vaccination Clinic  Name:  Eric Brock    MRN: [id number] DOB: 07-01-1941  09/06/2019  Mr. Eric Brock was observed post Covid-19 immunization for 15 minutes without incidence. He was provided with Vaccine Information Sheet and instruction to access the V-Safe system.   Mr. Eric Brock was instructed to call 911 with any severe reactions post vaccine: Marland Kitchen Difficulty breathing  . Swelling of your face and throat  . A fast heartbeat  . A bad rash all over your body  . Dizziness and weakness    Immunizations Administered    Name Date Dose VIS Date Route   Pfizer COVID-19 Vaccine 09/06/2019  1:14 PM 0.3 mL 07/13/2019 Intramuscular   Manufacturer: [place]   Lot: [id number]   [place]: [id number]

## 2019-09-10 DIAGNOSIS — R6889 Other general symptoms and signs: Secondary | ICD-10-CM | POA: Diagnosis not present

## 2019-09-28 DIAGNOSIS — J301 Allergic rhinitis due to pollen: Secondary | ICD-10-CM | POA: Diagnosis not present

## 2019-09-28 DIAGNOSIS — J3089 Other allergic rhinitis: Secondary | ICD-10-CM | POA: Diagnosis not present

## 2019-09-28 DIAGNOSIS — J3081 Allergic rhinitis due to animal (cat) (dog) hair and dander: Secondary | ICD-10-CM | POA: Diagnosis not present

## 2019-10-01 DIAGNOSIS — J3089 Other allergic rhinitis: Secondary | ICD-10-CM | POA: Diagnosis not present

## 2019-10-01 DIAGNOSIS — J3081 Allergic rhinitis due to animal (cat) (dog) hair and dander: Secondary | ICD-10-CM | POA: Diagnosis not present

## 2019-10-01 DIAGNOSIS — J301 Allergic rhinitis due to pollen: Secondary | ICD-10-CM | POA: Diagnosis not present

## 2019-10-08 ENCOUNTER — Telehealth: Payer: Self-pay | Admitting: Emergency Medicine

## 2019-10-08 MED ORDER — DOXYCYCLINE HYCLATE 100 MG PO TABS: 100.0000 mg | ORAL_TABLET | Freq: Two times a day (BID) | ORAL | 0 refills | Status: DC

## 2019-10-08 NOTE — Telephone Encounter (Signed)
Called and spoke with pt letting him know that we were going to send doxy rx to pharmacy for him. Stated to him after this, if no better we would need him to schedule OV. Pt verbalized understanding. rx sent to pharmacy for pt. Nothing further needed.

## 2019-10-08 NOTE — Telephone Encounter (Signed)
Believe we should treat him with doxycycline 100 mg twice daily for 7 days.  If he continues to have symptoms, is not improving them I would like for him to set up an office visit either with me or APP.  Thanks

## 2019-10-08 NOTE — Telephone Encounter (Signed)
Spoke with the pt  He is c/o cough with thick, clear sputum x 3 days  He also c/o temp of 99 today, no chills or body aches Minimal wheezing but not having any increased SOB or chest tightness  Please advise thanks

## 2019-10-09 DIAGNOSIS — J3081 Allergic rhinitis due to animal (cat) (dog) hair and dander: Secondary | ICD-10-CM | POA: Diagnosis not present

## 2019-10-09 DIAGNOSIS — J301 Allergic rhinitis due to pollen: Secondary | ICD-10-CM | POA: Diagnosis not present

## 2019-10-09 DIAGNOSIS — J3089 Other allergic rhinitis: Secondary | ICD-10-CM | POA: Diagnosis not present

## 2019-10-15 DIAGNOSIS — J3089 Other allergic rhinitis: Secondary | ICD-10-CM | POA: Diagnosis not present

## 2019-10-15 DIAGNOSIS — J3081 Allergic rhinitis due to animal (cat) (dog) hair and dander: Secondary | ICD-10-CM | POA: Diagnosis not present

## 2019-10-15 DIAGNOSIS — J301 Allergic rhinitis due to pollen: Secondary | ICD-10-CM | POA: Diagnosis not present

## 2019-10-19 DIAGNOSIS — J301 Allergic rhinitis due to pollen: Secondary | ICD-10-CM | POA: Diagnosis not present

## 2019-10-19 DIAGNOSIS — J3081 Allergic rhinitis due to animal (cat) (dog) hair and dander: Secondary | ICD-10-CM | POA: Diagnosis not present

## 2019-10-26 DIAGNOSIS — J3089 Other allergic rhinitis: Secondary | ICD-10-CM | POA: Diagnosis not present

## 2019-10-26 DIAGNOSIS — J301 Allergic rhinitis due to pollen: Secondary | ICD-10-CM | POA: Diagnosis not present

## 2019-10-26 DIAGNOSIS — J3081 Allergic rhinitis due to animal (cat) (dog) hair and dander: Secondary | ICD-10-CM | POA: Diagnosis not present

## 2019-10-30 DIAGNOSIS — J3081 Allergic rhinitis due to animal (cat) (dog) hair and dander: Secondary | ICD-10-CM | POA: Diagnosis not present

## 2019-10-30 DIAGNOSIS — J3089 Other allergic rhinitis: Secondary | ICD-10-CM | POA: Diagnosis not present

## 2019-10-30 DIAGNOSIS — J301 Allergic rhinitis due to pollen: Secondary | ICD-10-CM | POA: Diagnosis not present

## 2019-11-01 DIAGNOSIS — J3089 Other allergic rhinitis: Secondary | ICD-10-CM | POA: Diagnosis not present

## 2019-11-01 DIAGNOSIS — J3081 Allergic rhinitis due to animal (cat) (dog) hair and dander: Secondary | ICD-10-CM | POA: Diagnosis not present

## 2019-11-01 DIAGNOSIS — J301 Allergic rhinitis due to pollen: Secondary | ICD-10-CM | POA: Diagnosis not present

## 2019-11-05 DIAGNOSIS — J301 Allergic rhinitis due to pollen: Secondary | ICD-10-CM | POA: Diagnosis not present

## 2019-11-05 DIAGNOSIS — J3081 Allergic rhinitis due to animal (cat) (dog) hair and dander: Secondary | ICD-10-CM | POA: Diagnosis not present

## 2019-11-05 DIAGNOSIS — J3089 Other allergic rhinitis: Secondary | ICD-10-CM | POA: Diagnosis not present

## 2019-11-09 ENCOUNTER — Telehealth: Payer: Self-pay | Admitting: Family Medicine

## 2019-11-09 MED ORDER — DESOXIMETASONE 0.25 % EX OINT: 1.0000 "application " | TOPICAL_OINTMENT | Freq: Two times a day (BID) | CUTANEOUS | 2 refills | Status: AC

## 2019-11-09 NOTE — Telephone Encounter (Signed)
[name]: [place]

## 2019-11-09 NOTE — Telephone Encounter (Signed)
Refill has been sent in.  

## 2019-11-12 ENCOUNTER — Telehealth: Payer: Self-pay

## 2019-11-12 NOTE — Telephone Encounter (Signed)
PA for deximethasone ointment has been approved

## 2019-11-13 DIAGNOSIS — J301 Allergic rhinitis due to pollen: Secondary | ICD-10-CM | POA: Diagnosis not present

## 2019-11-13 DIAGNOSIS — J3089 Other allergic rhinitis: Secondary | ICD-10-CM | POA: Diagnosis not present

## 2019-11-13 DIAGNOSIS — J3081 Allergic rhinitis due to animal (cat) (dog) hair and dander: Secondary | ICD-10-CM | POA: Diagnosis not present

## 2019-11-15 DIAGNOSIS — J3089 Other allergic rhinitis: Secondary | ICD-10-CM | POA: Diagnosis not present

## 2019-11-15 DIAGNOSIS — J301 Allergic rhinitis due to pollen: Secondary | ICD-10-CM | POA: Diagnosis not present

## 2019-11-20 DIAGNOSIS — J3081 Allergic rhinitis due to animal (cat) (dog) hair and dander: Secondary | ICD-10-CM | POA: Diagnosis not present

## 2019-11-20 DIAGNOSIS — J3089 Other allergic rhinitis: Secondary | ICD-10-CM | POA: Diagnosis not present

## 2019-11-20 DIAGNOSIS — J301 Allergic rhinitis due to pollen: Secondary | ICD-10-CM | POA: Diagnosis not present

## 2019-11-22 DIAGNOSIS — J3089 Other allergic rhinitis: Secondary | ICD-10-CM | POA: Diagnosis not present

## 2019-11-22 DIAGNOSIS — J301 Allergic rhinitis due to pollen: Secondary | ICD-10-CM | POA: Diagnosis not present

## 2019-11-22 DIAGNOSIS — J3081 Allergic rhinitis due to animal (cat) (dog) hair and dander: Secondary | ICD-10-CM | POA: Diagnosis not present

## 2019-11-26 DIAGNOSIS — H1045 Other chronic allergic conjunctivitis: Secondary | ICD-10-CM | POA: Diagnosis not present

## 2019-11-26 DIAGNOSIS — J301 Allergic rhinitis due to pollen: Secondary | ICD-10-CM | POA: Diagnosis not present

## 2019-11-26 DIAGNOSIS — J3089 Other allergic rhinitis: Secondary | ICD-10-CM | POA: Diagnosis not present

## 2019-11-26 DIAGNOSIS — J479 Bronchiectasis, uncomplicated: Secondary | ICD-10-CM | POA: Diagnosis not present

## 2019-11-26 DIAGNOSIS — J3081 Allergic rhinitis due to animal (cat) (dog) hair and dander: Secondary | ICD-10-CM | POA: Diagnosis not present

## 2019-11-29 DIAGNOSIS — J301 Allergic rhinitis due to pollen: Secondary | ICD-10-CM | POA: Diagnosis not present

## 2019-11-29 DIAGNOSIS — J3081 Allergic rhinitis due to animal (cat) (dog) hair and dander: Secondary | ICD-10-CM | POA: Diagnosis not present

## 2019-11-29 DIAGNOSIS — J3089 Other allergic rhinitis: Secondary | ICD-10-CM | POA: Diagnosis not present

## 2019-12-03 DIAGNOSIS — J301 Allergic rhinitis due to pollen: Secondary | ICD-10-CM | POA: Diagnosis not present

## 2019-12-03 DIAGNOSIS — J3089 Other allergic rhinitis: Secondary | ICD-10-CM | POA: Diagnosis not present

## 2019-12-03 DIAGNOSIS — J3081 Allergic rhinitis due to animal (cat) (dog) hair and dander: Secondary | ICD-10-CM | POA: Diagnosis not present

## 2019-12-05 DIAGNOSIS — J3081 Allergic rhinitis due to animal (cat) (dog) hair and dander: Secondary | ICD-10-CM | POA: Diagnosis not present

## 2019-12-05 DIAGNOSIS — J301 Allergic rhinitis due to pollen: Secondary | ICD-10-CM | POA: Diagnosis not present

## 2019-12-05 DIAGNOSIS — J3089 Other allergic rhinitis: Secondary | ICD-10-CM | POA: Diagnosis not present

## 2019-12-11 DIAGNOSIS — J479 Bronchiectasis, uncomplicated: Secondary | ICD-10-CM | POA: Diagnosis not present

## 2019-12-11 DIAGNOSIS — H1045 Other chronic allergic conjunctivitis: Secondary | ICD-10-CM | POA: Diagnosis not present

## 2019-12-11 DIAGNOSIS — J301 Allergic rhinitis due to pollen: Secondary | ICD-10-CM | POA: Diagnosis not present

## 2019-12-11 DIAGNOSIS — J3089 Other allergic rhinitis: Secondary | ICD-10-CM | POA: Diagnosis not present

## 2019-12-11 DIAGNOSIS — J3081 Allergic rhinitis due to animal (cat) (dog) hair and dander: Secondary | ICD-10-CM | POA: Diagnosis not present

## 2020-01-07 DIAGNOSIS — J3081 Allergic rhinitis due to animal (cat) (dog) hair and dander: Secondary | ICD-10-CM | POA: Diagnosis not present

## 2020-01-07 DIAGNOSIS — J301 Allergic rhinitis due to pollen: Secondary | ICD-10-CM | POA: Diagnosis not present

## 2020-01-07 DIAGNOSIS — J3089 Other allergic rhinitis: Secondary | ICD-10-CM | POA: Diagnosis not present

## 2020-01-21 DIAGNOSIS — J3081 Allergic rhinitis due to animal (cat) (dog) hair and dander: Secondary | ICD-10-CM | POA: Diagnosis not present

## 2020-01-21 DIAGNOSIS — J3089 Other allergic rhinitis: Secondary | ICD-10-CM | POA: Diagnosis not present

## 2020-01-21 DIAGNOSIS — J301 Allergic rhinitis due to pollen: Secondary | ICD-10-CM | POA: Diagnosis not present

## 2020-01-28 DIAGNOSIS — J3089 Other allergic rhinitis: Secondary | ICD-10-CM | POA: Diagnosis not present

## 2020-01-28 DIAGNOSIS — J301 Allergic rhinitis due to pollen: Secondary | ICD-10-CM | POA: Diagnosis not present

## 2020-01-28 DIAGNOSIS — J3081 Allergic rhinitis due to animal (cat) (dog) hair and dander: Secondary | ICD-10-CM | POA: Diagnosis not present

## 2020-02-05 DIAGNOSIS — J3089 Other allergic rhinitis: Secondary | ICD-10-CM | POA: Diagnosis not present

## 2020-02-05 DIAGNOSIS — J301 Allergic rhinitis due to pollen: Secondary | ICD-10-CM | POA: Diagnosis not present

## 2020-02-05 DIAGNOSIS — J3081 Allergic rhinitis due to animal (cat) (dog) hair and dander: Secondary | ICD-10-CM | POA: Diagnosis not present

## 2020-02-11 DIAGNOSIS — J3081 Allergic rhinitis due to animal (cat) (dog) hair and dander: Secondary | ICD-10-CM | POA: Diagnosis not present

## 2020-02-11 DIAGNOSIS — J3089 Other allergic rhinitis: Secondary | ICD-10-CM | POA: Diagnosis not present

## 2020-02-11 DIAGNOSIS — J301 Allergic rhinitis due to pollen: Secondary | ICD-10-CM | POA: Diagnosis not present

## 2020-02-21 DIAGNOSIS — J3089 Other allergic rhinitis: Secondary | ICD-10-CM | POA: Diagnosis not present

## 2020-02-21 DIAGNOSIS — J301 Allergic rhinitis due to pollen: Secondary | ICD-10-CM | POA: Diagnosis not present

## 2020-02-21 DIAGNOSIS — J3081 Allergic rhinitis due to animal (cat) (dog) hair and dander: Secondary | ICD-10-CM | POA: Diagnosis not present

## 2020-02-28 DIAGNOSIS — J301 Allergic rhinitis due to pollen: Secondary | ICD-10-CM | POA: Diagnosis not present

## 2020-02-28 DIAGNOSIS — J3089 Other allergic rhinitis: Secondary | ICD-10-CM | POA: Diagnosis not present

## 2020-02-28 DIAGNOSIS — J3081 Allergic rhinitis due to animal (cat) (dog) hair and dander: Secondary | ICD-10-CM | POA: Diagnosis not present

## 2020-03-04 DIAGNOSIS — J301 Allergic rhinitis due to pollen: Secondary | ICD-10-CM | POA: Diagnosis not present

## 2020-03-04 DIAGNOSIS — J3081 Allergic rhinitis due to animal (cat) (dog) hair and dander: Secondary | ICD-10-CM | POA: Diagnosis not present

## 2020-03-04 DIAGNOSIS — J3089 Other allergic rhinitis: Secondary | ICD-10-CM | POA: Diagnosis not present

## 2020-03-11 DIAGNOSIS — J3089 Other allergic rhinitis: Secondary | ICD-10-CM | POA: Diagnosis not present

## 2020-03-11 DIAGNOSIS — J301 Allergic rhinitis due to pollen: Secondary | ICD-10-CM | POA: Diagnosis not present

## 2020-03-11 DIAGNOSIS — J3081 Allergic rhinitis due to animal (cat) (dog) hair and dander: Secondary | ICD-10-CM | POA: Diagnosis not present

## 2020-03-28 DIAGNOSIS — J3081 Allergic rhinitis due to animal (cat) (dog) hair and dander: Secondary | ICD-10-CM | POA: Diagnosis not present

## 2020-03-28 DIAGNOSIS — J301 Allergic rhinitis due to pollen: Secondary | ICD-10-CM | POA: Diagnosis not present

## 2020-03-28 DIAGNOSIS — J3089 Other allergic rhinitis: Secondary | ICD-10-CM | POA: Diagnosis not present

## 2020-04-02 DIAGNOSIS — J301 Allergic rhinitis due to pollen: Secondary | ICD-10-CM | POA: Diagnosis not present

## 2020-04-02 DIAGNOSIS — J3089 Other allergic rhinitis: Secondary | ICD-10-CM | POA: Diagnosis not present

## 2020-04-02 DIAGNOSIS — J3081 Allergic rhinitis due to animal (cat) (dog) hair and dander: Secondary | ICD-10-CM | POA: Diagnosis not present

## 2020-04-08 DIAGNOSIS — J3089 Other allergic rhinitis: Secondary | ICD-10-CM | POA: Diagnosis not present

## 2020-04-08 DIAGNOSIS — J3081 Allergic rhinitis due to animal (cat) (dog) hair and dander: Secondary | ICD-10-CM | POA: Diagnosis not present

## 2020-04-08 DIAGNOSIS — J301 Allergic rhinitis due to pollen: Secondary | ICD-10-CM | POA: Diagnosis not present

## 2020-04-09 ENCOUNTER — Telehealth: Payer: Self-pay | Admitting: Family Medicine

## 2020-04-09 NOTE — Progress Notes (Signed)
  Chronic Care Management   Note  04/09/2020 Name: ERIQ HUFFORD MRN: [id number] DOB: 1941/01/10  QAIS JOWERS is a 79 y.o. year old male who is a primary care patient of Burchette, Alinda Sierras, MD. I reached out to Amedeo Gory by phone today in response to a referral sent by Mr. DARTANION TEO PCP, Eulas Post, MD.   Mr. Melchior was given information about Chronic Care Management services today including:  1. CCM service includes personalized support from designated clinical staff supervised by his physician, including individualized plan of care and coordination with other care providers 2. 24/7 contact phone numbers for assistance for urgent and routine care needs. 3. Service will only be billed when office clinical staff spend 20 minutes or more in a month to coordinate care. 4. Only one practitioner may furnish and bill the service in a calendar month. 5. The patient may stop CCM services at any time (effective at the end of the month) by phone call to the office staff.   Patient agreed to services and verbal consent obtained.   Follow up plan:   Carley Perdue [employer] Scheduler

## 2020-04-14 DIAGNOSIS — J3089 Other allergic rhinitis: Secondary | ICD-10-CM | POA: Diagnosis not present

## 2020-04-14 DIAGNOSIS — J3081 Allergic rhinitis due to animal (cat) (dog) hair and dander: Secondary | ICD-10-CM | POA: Diagnosis not present

## 2020-04-14 DIAGNOSIS — J301 Allergic rhinitis due to pollen: Secondary | ICD-10-CM | POA: Diagnosis not present

## 2020-04-24 DIAGNOSIS — J3089 Other allergic rhinitis: Secondary | ICD-10-CM | POA: Diagnosis not present

## 2020-04-24 DIAGNOSIS — J3081 Allergic rhinitis due to animal (cat) (dog) hair and dander: Secondary | ICD-10-CM | POA: Diagnosis not present

## 2020-04-28 DIAGNOSIS — J301 Allergic rhinitis due to pollen: Secondary | ICD-10-CM | POA: Diagnosis not present

## 2020-04-28 DIAGNOSIS — J3081 Allergic rhinitis due to animal (cat) (dog) hair and dander: Secondary | ICD-10-CM | POA: Diagnosis not present

## 2020-04-28 DIAGNOSIS — J3089 Other allergic rhinitis: Secondary | ICD-10-CM | POA: Diagnosis not present

## 2020-05-05 DIAGNOSIS — J301 Allergic rhinitis due to pollen: Secondary | ICD-10-CM | POA: Diagnosis not present

## 2020-05-05 DIAGNOSIS — J3089 Other allergic rhinitis: Secondary | ICD-10-CM | POA: Diagnosis not present

## 2020-05-05 DIAGNOSIS — J3081 Allergic rhinitis due to animal (cat) (dog) hair and dander: Secondary | ICD-10-CM | POA: Diagnosis not present

## 2020-05-06 ENCOUNTER — Ambulatory Visit: Payer: Medicare Other | Attending: Internal Medicine

## 2020-05-06 ENCOUNTER — Other Ambulatory Visit (HOSPITAL_BASED_OUTPATIENT_CLINIC_OR_DEPARTMENT_OTHER): Payer: Self-pay | Admitting: Internal Medicine

## 2020-05-06 DIAGNOSIS — Z23 Encounter for immunization: Secondary | ICD-10-CM

## 2020-05-06 MED FILL — FLUAD QUADRIVALENT 0.5 ML P: 0.5 | 1 days supply | Qty: 1 | Fill #0

## 2020-05-06 NOTE — Progress Notes (Signed)
   Covid-19 Vaccination Clinic  Name:  Eric Brock    MRN: [id number] DOB: Jul 31, 1941  05/06/2020  Eric Brock was observed post Covid-19 immunization for 15 minutes without incident. He was provided with Vaccine Information Sheet and instruction to access the V-Safe system. Vaccinated by Sabra Heck  Eric Brock was instructed to call 911 with any severe reactions post vaccine: Marland Kitchen Difficulty breathing  . Swelling of face and throat  . A fast heartbeat  . A bad rash all over body  . Dizziness and weakness

## 2020-05-07 ENCOUNTER — Telehealth: Payer: Self-pay | Admitting: Family Medicine

## 2020-05-07 NOTE — Telephone Encounter (Signed)
Spoke with patient spouse she request to call back next week 05/12/20

## 2020-05-13 ENCOUNTER — Telehealth: Payer: Self-pay | Admitting: Family Medicine

## 2020-05-13 DIAGNOSIS — J3089 Other allergic rhinitis: Secondary | ICD-10-CM | POA: Diagnosis not present

## 2020-05-13 DIAGNOSIS — J3081 Allergic rhinitis due to animal (cat) (dog) hair and dander: Secondary | ICD-10-CM | POA: Diagnosis not present

## 2020-05-13 DIAGNOSIS — J301 Allergic rhinitis due to pollen: Secondary | ICD-10-CM | POA: Diagnosis not present

## 2020-05-13 NOTE — Telephone Encounter (Signed)
Spoke with patient wife he was not in and she said to call back around 1:45pm

## 2020-05-22 DIAGNOSIS — J301 Allergic rhinitis due to pollen: Secondary | ICD-10-CM | POA: Diagnosis not present

## 2020-05-22 DIAGNOSIS — J3089 Other allergic rhinitis: Secondary | ICD-10-CM | POA: Diagnosis not present

## 2020-05-22 DIAGNOSIS — J3081 Allergic rhinitis due to animal (cat) (dog) hair and dander: Secondary | ICD-10-CM | POA: Diagnosis not present

## 2020-05-23 ENCOUNTER — Ambulatory Visit: Payer: Medicare Other

## 2020-05-26 DIAGNOSIS — J3081 Allergic rhinitis due to animal (cat) (dog) hair and dander: Secondary | ICD-10-CM | POA: Diagnosis not present

## 2020-05-26 DIAGNOSIS — J3089 Other allergic rhinitis: Secondary | ICD-10-CM | POA: Diagnosis not present

## 2020-05-26 DIAGNOSIS — J479 Bronchiectasis, uncomplicated: Secondary | ICD-10-CM | POA: Diagnosis not present

## 2020-05-26 DIAGNOSIS — J301 Allergic rhinitis due to pollen: Secondary | ICD-10-CM | POA: Diagnosis not present

## 2020-05-26 DIAGNOSIS — H1045 Other chronic allergic conjunctivitis: Secondary | ICD-10-CM | POA: Diagnosis not present

## 2020-05-29 DIAGNOSIS — J301 Allergic rhinitis due to pollen: Secondary | ICD-10-CM | POA: Diagnosis not present

## 2020-06-05 ENCOUNTER — Encounter: Payer: Self-pay | Admitting: Emergency Medicine

## 2020-06-05 ENCOUNTER — Other Ambulatory Visit: Payer: Self-pay

## 2020-06-05 ENCOUNTER — Ambulatory Visit: Payer: Medicare Other | Admitting: Emergency Medicine

## 2020-06-05 DIAGNOSIS — J479 Bronchiectasis, uncomplicated: Secondary | ICD-10-CM

## 2020-06-05 DIAGNOSIS — J301 Allergic rhinitis due to pollen: Secondary | ICD-10-CM | POA: Diagnosis not present

## 2020-06-05 DIAGNOSIS — J3081 Allergic rhinitis due to animal (cat) (dog) hair and dander: Secondary | ICD-10-CM | POA: Diagnosis not present

## 2020-06-05 DIAGNOSIS — J3089 Other allergic rhinitis: Secondary | ICD-10-CM | POA: Diagnosis not present

## 2020-06-05 NOTE — Progress Notes (Signed)
Subjective:    Patient ID: Eric Brock, male    DOB: [birth year], 79 y.o.   MRN: [id number]  HPI 79 year old man who follows up for his history of bronchiectasis, chronic cough, allergic rhinitis.  Past medical history non-Hodgkin's lymphoma, prostate CA. He had exacerbation consistent with acute bronchitis in March 2021, treated with doxycycline. Today reports that he has been doing very well, overall sx have been better since he started immunotherapy.   On Allegra, fluticasone nasal spray as needed. He has been on allergy shots for the last year - has made a huge impact on his allergy sx. Barely any mucous, no flares. Clears some clear mucous most days.  He has a flutter valve, uses rarely because the mucous now clears easily.   Most recent imaging CT chest 03/01/2019 that showed medial bronchiectasis and atelectatic change in the right middle lobe and lingula, some evidence of mucoid impaction, no nodules.  COVID and flu vaccine up to date.    Review of Systems As per HPI  Past Medical History:  Diagnosis Date  . Allergic rhinitis   . Arthritis   . Bronchiectasis pulmologist-  dr Lamonte Sakai ([place])--  per lov note 03-22-2016 stable   intermittant--  per pt last bout yr ago 2016   . Eczema   . Elevated PSA   . History of colon polyps    1999  . History of concussion    2005 and 2010 with mild cerebral bleed resolved -no residual  . History of non-Hodgkin's lymphoma oncologist-  dr Benay Spice--  pt in remission   dx 03/ 2000  ,  Stage IA,  scalp/forehead ,  post chemo/radioation therapy  . Hyperlipidemia    diet controlled, no med  . Lower urinary tract symptoms (LUTS)   . Prostate cancer [facility]) urologist-  dr Alyson Ingles  oncologist-  dr Alen Blew and dr Tammi Klippel   cT2b N0 M0, Gleason 4+3,  PSA 4.2,  vol 35.0cc--  plan IMRT w/ gold seeds and ADT  . Wears glasses      Family History  Problem Relation Age of Onset  . Heart disease Father   . Cancer Brother        prostate  .  Brain cancer Mother   . Colon cancer Neg Hx   . Colon polyps Neg Hx   . Esophageal cancer Neg Hx   . Rectal cancer Neg Hx   . Stomach cancer Neg Hx      Social History   Socioeconomic History  . Marital status: Married    Spouse name: Not on file  . Number of children: Not on file  . Years of education: Not on file  . Highest education level: Not on file  Occupational History  . Occupation: retired    Fish farm manager: RETIRED  Tobacco Use  . Smoking status: Former Smoker    Packs/day: 1.00    Years: 12.00    Pack years: 12.00    Types: Cigarettes    Quit date: 08/03/1975    Years since quitting: 44.8  . Smokeless tobacco: Never Used  Vaping Use  . Vaping Use: Never used  Substance and Sexual Activity  . Alcohol use: Yes    Alcohol/week: 7.0 standard drinks    Types: 7 Glasses of wine per week    Comment: 1 glass of wine daily  . Drug use: No  . Sexual activity: Yes  Other Topics Concern  . Not on file  Social History Narrative  . Not on  file   Social Determinants of Health   Financial Resource Strain:   . Difficulty of Paying Living Expenses: Not on file  Food Insecurity:   . Worried About Charity fundraiser in the Last Year: Not on file  . Ran Out of Food in the Last Year: Not on file  Transportation Needs:   . Lack of Transportation (Medical): Not on file  . Lack of Transportation (Non-Medical): Not on file  Physical Activity:   . Days of Exercise per Week: Not on file  . Minutes of Exercise per Session: Not on file  Stress:   . Feeling of Stress : Not on file  Social Connections:   . Frequency of Communication with Friends and Family: Not on file  . Frequency of Social Gatherings with Friends and Family: Not on file  . Attends Religious Services: Not on file  . Active Member of Clubs or Organizations: Not on file  . Attends Archivist Meetings: Not on file  . Marital Status: Not on file  Intimate Partner Violence:   . Fear of Current or  Ex-Partner: Not on file  . Emotionally Abused: Not on file  . Physically Abused: Not on file  . Sexually Abused: Not on file     Allergies  Allergen Reactions  . Pollen Extract-Tree Extract [Pollen Extract]     Weed allergies also     Outpatient Medications Prior to Visit  Medication Sig Dispense Refill  . Avanafil (STENDRA) 200 MG TABS Take 1 tablet by mouth once a week.    . Desoximetasone (TOPICORT) 0.25 % ointment Apply 1 application topically 2 (two) times daily. 30 g 2  . EPINEPHrine 0.3 mg/0.3 mL IJ SOAJ injection Inject into the muscle.    . fexofenadine (ALLEGRA) 180 MG tablet Take 180 mg by mouth every morning.    . fluticasone (FLONASE) 50 MCG/ACT nasal spray SPRAY TWO SPRAYS IN EACH NOSTRIL DAILY AS NEEDED 16 g 2  . GuaiFENesin (MUCINEX PO) Take 1,200 mg by mouth 2 (two) times daily. Extended release    . ibuprofen (ADVIL,MOTRIN) 200 MG tablet Take 200 mg by mouth every 6 (six) hours as needed.     . mometasone (ELOCON) 0.1 % cream Apply topically.    . Multiple Vitamins-Minerals (CENTRUM ADULTS PO) Take 1 tablet by mouth daily.    Marland Kitchen Respiratory Therapy Supplies (FLUTTER) DEVI 1 Device by Does not apply route as needed. 1 each 0  . doxycycline (VIBRA-TABS) 100 MG tablet Take 1 tablet (100 mg total) by mouth 2 (two) times daily. 14 tablet 0  . amoxicillin (AMOXIL) 250 MG capsule Take by mouth.     No facility-administered medications prior to visit.          Objective:   Physical Exam Vitals:   06/05/20 1403  BP: 134/72  Pulse: (!) 56  Temp: 97.7 F (36.5 C)  SpO2: 98%  Weight: 187 lb 12.8 oz (85.2 kg)  Height: 5' 10.5" (1.791 m)   Gen: Pleasant, well-nourished, in no distress,  normal affect  ENT: No lesions,  mouth clear,  oropharynx clear, no postnasal drip  Neck: No JVD, no stridor  Lungs: No use of accessory muscles, no crackles or wheezing on normal respiration, no wheeze on forced expiration  Cardiovascular: RRR, heart sounds normal, no murmur  or gallops, no peripheral edema  Musculoskeletal: No deformities, no cyanosis or clubbing  Neuro: alert, awake, non focal  Skin: Warm, no lesions or rash  Assessment & Plan:  BRONCHIECTASIS Doing very well.  His last exacerbation/bronchitis was March 2021.  His mucus burden, cough have improved largely due to the addition of his immunotherapy.  He gets allergy shots weekly.  He is no longer requiring maintenance Allegra or fluticasone, has these available to use if he needs them.  Not clear that we need to rush to repeat his CT scan of the chest given his clinical stability.  Use your flutter valve if you need it to help clear your secretions. Continue your Mucinex as you have been taking it. We will follow-up in July 2022.  At that time we can talk about whether we need to repeat your CT scan of the chest depending on how your symptoms have been doing. COVID-19 and flu vaccines are both up-to-date.  Allergic rhinitis Please continue getting your allergy shots (immunotherapy) as you have been doing. Use your Allegra and fluticasone nasal spray as needed for any increase in your allergy symptoms, congestion, coughing.  Baltazar Apo, MD, PhD 06/05/2020, 2:16 PM [facility] [phone] or if no answer [phone]

## 2020-06-05 NOTE — Assessment & Plan Note (Addendum)
Doing very well.  His last exacerbation/bronchitis was March 2021.  His mucus burden, cough have improved largely due to the addition of his immunotherapy.  He gets allergy shots weekly.  He is no longer requiring maintenance Allegra or fluticasone, has these available to use if he needs them.  Not clear that we need to rush to repeat his CT scan of the chest given his clinical stability.  Use your flutter valve if you need it to help clear your secretions. Continue your Mucinex as you have been taking it. We will follow-up in July 2022.  At that time we can talk about whether we need to repeat your CT scan of the chest depending on how your symptoms have been doing. COVID-19 and flu vaccines are both up-to-date.

## 2020-06-05 NOTE — Assessment & Plan Note (Signed)
Please continue getting your allergy shots (immunotherapy) as you have been doing. Use your Allegra and fluticasone nasal spray as needed for any increase in your allergy symptoms, congestion, coughing.

## 2020-06-05 NOTE — Patient Instructions (Addendum)
Please continue getting your allergy shots (immunotherapy) as you have been doing. Use your Allegra and fluticasone nasal spray as needed for any increase in your allergy symptoms, congestion, coughing. Use your flutter valve if you need it to help clear your secretions. Continue your Mucinex as you have been taking it. We will follow-up in July 2022.  At that time we can talk about whether we need to repeat your CT scan of the chest depending on how your symptoms have been doing. COVID-19 and flu vaccines are both up-to-date.

## 2020-06-10 DIAGNOSIS — J301 Allergic rhinitis due to pollen: Secondary | ICD-10-CM | POA: Diagnosis not present

## 2020-06-10 DIAGNOSIS — J3089 Other allergic rhinitis: Secondary | ICD-10-CM | POA: Diagnosis not present

## 2020-06-10 DIAGNOSIS — J3081 Allergic rhinitis due to animal (cat) (dog) hair and dander: Secondary | ICD-10-CM | POA: Diagnosis not present

## 2020-06-17 DIAGNOSIS — J3081 Allergic rhinitis due to animal (cat) (dog) hair and dander: Secondary | ICD-10-CM | POA: Diagnosis not present

## 2020-06-17 DIAGNOSIS — J3089 Other allergic rhinitis: Secondary | ICD-10-CM | POA: Diagnosis not present

## 2020-06-17 DIAGNOSIS — J301 Allergic rhinitis due to pollen: Secondary | ICD-10-CM | POA: Diagnosis not present

## 2020-06-30 DIAGNOSIS — Z961 Presence of intraocular lens: Secondary | ICD-10-CM | POA: Diagnosis not present

## 2020-06-30 DIAGNOSIS — J3089 Other allergic rhinitis: Secondary | ICD-10-CM | POA: Diagnosis not present

## 2020-06-30 DIAGNOSIS — J3081 Allergic rhinitis due to animal (cat) (dog) hair and dander: Secondary | ICD-10-CM | POA: Diagnosis not present

## 2020-06-30 DIAGNOSIS — J301 Allergic rhinitis due to pollen: Secondary | ICD-10-CM | POA: Diagnosis not present

## 2020-07-08 ENCOUNTER — Ambulatory Visit: Payer: Medicare Other | Admitting: Family Medicine

## 2020-07-08 DIAGNOSIS — J3089 Other allergic rhinitis: Secondary | ICD-10-CM | POA: Diagnosis not present

## 2020-07-08 DIAGNOSIS — J301 Allergic rhinitis due to pollen: Secondary | ICD-10-CM | POA: Diagnosis not present

## 2020-07-08 DIAGNOSIS — J3081 Allergic rhinitis due to animal (cat) (dog) hair and dander: Secondary | ICD-10-CM | POA: Diagnosis not present

## 2020-07-11 ENCOUNTER — Ambulatory Visit (INDEPENDENT_AMBULATORY_CARE_PROVIDER_SITE_OTHER): Payer: Medicare Other | Admitting: Family Medicine

## 2020-07-11 ENCOUNTER — Encounter: Payer: Self-pay | Admitting: Family Medicine

## 2020-07-11 ENCOUNTER — Other Ambulatory Visit: Payer: Self-pay

## 2020-07-11 VITALS — BP 126/74 | HR 74 | Temp 98.1°F | Ht 70.5 in | Wt 183.0 lb

## 2020-07-11 DIAGNOSIS — Z0000 Encounter for general adult medical examination without abnormal findings: Secondary | ICD-10-CM | POA: Diagnosis not present

## 2020-07-11 MED ORDER — SILDENAFIL CITRATE 100 MG PO TABS: 50.0000 mg | ORAL_TABLET | Freq: Every day | ORAL | 11 refills | Status: DC | PRN

## 2020-07-11 NOTE — Patient Instructions (Signed)
Consider Shingrix vaccine at some point this year.  

## 2020-07-11 NOTE — Progress Notes (Signed)
Established Patient Office Visit  Subjective:  Patient ID: Eric Brock, male    DOB: 1940/12/31  Age: 79 y.o. MRN: [id number]  CC: No chief complaint on file.   HPI [name] presents for physical exam.  Chronic problems include history of bronchiectasis, osteoarthritis, history of prostate cancer, history of non-Hodgkin's lymphoma, history of mild hyperlipidemia.  He is still followed by urologist yearly.  Also has remote history of subdural hematoma following an injury years ago.  He does relate some erectile dysfunction.  He states he is able to have some hardening but not enough to penetrate.  He was tried years ago by urologist on Viagra without much success.  He would like to consider trying this once again.  He has had previous radiation seed implants.  He is aware that it is possible that no medications will help.  Health maintenance reviewed  -Flu vaccine already given -Pneumonia vaccines complete -Tetanus due 2027 -Covid vaccines already given -Zostavax.  He has not had Shingrix and is considering this.  Social history-married.  2 sons.  4 grandchildren.  Retired from [employer] age 3.  Quit smoking around 1977.  No regular alcohol.  Family history.  His mother had some type of brain tumor.  Father had coronary disease in his 25s.  He has a brother with prostate cancer.  Past Medical History:  Diagnosis Date  . Allergic rhinitis   . Arthritis   . Bronchiectasis pulmologist-  dr Lamonte Sakai ([place])--  per lov note 03-22-2016 stable   intermittant--  per pt last bout yr ago 2016   . Eczema   . Elevated PSA   . History of colon polyps    1999  . History of concussion    2005 and 2010 with mild cerebral bleed resolved -no residual  . History of non-Hodgkin's lymphoma oncologist-  dr Benay Spice--  pt in remission   dx 03/ 2000  ,  Stage IA,  scalp/forehead ,  post chemo/radioation therapy  . Hyperlipidemia    diet controlled, no med  . Lower urinary  tract symptoms (LUTS)   . Prostate cancer [facility]) urologist-  dr Alyson Ingles  oncologist-  dr Alen Blew and dr Tammi Klippel   cT2b N0 M0, Gleason 4+3,  PSA 4.2,  vol 35.0cc--  plan IMRT w/ gold seeds and ADT  . Wears glasses     Past Surgical History:  Procedure Laterality Date  . CATARACT EXTRACTION W/ INTRAOCULAR LENS  IMPLANT, BILATERAL  2013  . CHOLECYSTECTOMY  1994  . COLONOSCOPY  last one 07-14-2015  . GOLD SEED IMPLANT N/A 04/16/2016   Procedure: GOLD SEED IMPLANT;  Surgeon: Cleon Gustin, MD;  Location: [facility];  Service: Urology;  Laterality: N/A;  . PROSTATE BIOPSY N/A 01/12/2016   Procedure: BIOPSY TRANSRECTAL ULTRASONIC PROSTATE (TUBP);  Surgeon: Cleon Gustin, MD;  Location: [facility];  Service: Urology;  Laterality: N/A;  . UMBILICAL HERNIA REPAIR  1992    Family History  Problem Relation Age of Onset  . Heart disease Father 68  . Cancer Brother        prostate  . Brain cancer Mother   . Colon cancer Neg Hx   . Colon polyps Neg Hx   . Esophageal cancer Neg Hx   . Rectal cancer Neg Hx   . Stomach cancer Neg Hx     Social History   Socioeconomic History  . Marital status: Married    Spouse name: Not on file  .  Number of children: Not on file  . Years of education: Not on file  . Highest education level: Not on file  Occupational History  . Occupation: retired    Fish farm manager: RETIRED  Tobacco Use  . Smoking status: Former Smoker    Packs/day: 1.00    Years: 12.00    Pack years: 12.00    Types: Cigarettes    Quit date: 08/03/1975    Years since quitting: 44.9  . Smokeless tobacco: Never Used  Vaping Use  . Vaping Use: Never used  Substance and Sexual Activity  . Alcohol use: Yes    Alcohol/week: 7.0 standard drinks    Types: 7 Glasses of wine per week    Comment: 1 glass of wine daily  . Drug use: No  . Sexual activity: Yes  Other Topics Concern  . Not on file  Social History Narrative  . Not on file   Social  Determinants of Health   Financial Resource Strain: Not on file  Food Insecurity: Not on file  Transportation Needs: Not on file  Physical Activity: Not on file  Stress: Not on file  Social Connections: Not on file  Intimate Partner Violence: Not on file    Outpatient Medications Prior to Visit  Medication Sig Dispense Refill  . Avanafil 200 MG TABS Take 1 tablet by mouth once a week.    . Desoximetasone (TOPICORT) 0.25 % ointment Apply 1 application topically 2 (two) times daily. 30 g 2  . EPINEPHrine 0.3 mg/0.3 mL IJ SOAJ injection Inject into the muscle.    . fexofenadine (ALLEGRA) 180 MG tablet Take 180 mg by mouth every morning.    . fluticasone (FLONASE) 50 MCG/ACT nasal spray SPRAY TWO SPRAYS IN EACH NOSTRIL DAILY AS NEEDED 16 g 2  . GuaiFENesin (MUCINEX PO) Take 1,200 mg by mouth 2 (two) times daily. Extended release    . ibuprofen (ADVIL,MOTRIN) 200 MG tablet Take 200 mg by mouth every 6 (six) hours as needed.    . mometasone (ELOCON) 0.1 % cream Apply topically.    . Multiple Vitamins-Minerals (CENTRUM ADULTS PO) Take 1 tablet by mouth daily.    Marland Kitchen Respiratory Therapy Supplies (FLUTTER) DEVI 1 Device by Does not apply route as needed. 1 each 0  . amoxicillin (AMOXIL) 250 MG capsule Take by mouth.     No facility-administered medications prior to visit.    Allergies  Allergen Reactions  . Pollen Extract-Tree Extract [Pollen Extract]     Weed allergies also    ROS Review of Systems  Constitutional: Negative for activity change, appetite change, fatigue and fever.  HENT: Negative for congestion, ear pain and trouble swallowing.   Eyes: Negative for pain and visual disturbance.  Respiratory: Negative for cough, shortness of breath and wheezing.   Cardiovascular: Negative for chest pain and palpitations.  Gastrointestinal: Negative for abdominal distention, abdominal pain, blood in stool, constipation, diarrhea, nausea, rectal pain and vomiting.  Genitourinary: Negative  for dysuria, hematuria and testicular pain.  Musculoskeletal: Negative for arthralgias and joint swelling.  Skin: Negative for rash.  Neurological: Negative for dizziness, syncope and headaches.  Hematological: Negative for adenopathy.  Psychiatric/Behavioral: Negative for confusion and dysphoric mood.      Objective:    Physical Exam Constitutional:      General: He is not in acute distress.    Appearance: He is well-developed and well-nourished.  HENT:     Head: Normocephalic and atraumatic.     Right Ear: External ear normal.  Left Ear: External ear normal.     Mouth/Throat:     Mouth: Oropharynx is clear and moist.  Eyes:     Extraocular Movements: EOM normal.     Conjunctiva/sclera: Conjunctivae normal.     Pupils: Pupils are equal, round, and reactive to light.  Neck:     Thyroid: No thyromegaly.  Cardiovascular:     Rate and Rhythm: Normal rate and regular rhythm.     Heart sounds: Normal heart sounds. No murmur heard.   Pulmonary:     Effort: No respiratory distress.     Breath sounds: No wheezing or rales.  Abdominal:     General: Bowel sounds are normal. There is no distension.     Palpations: Abdomen is soft.     Tenderness: There is no abdominal tenderness. There is no guarding or rebound.     Hernia: A hernia is present.     Comments: He has very small umbilical hernia and also ventral hernia which is soft and nontender  Musculoskeletal:        General: No edema.     Cervical back: Normal range of motion and neck supple.     Right lower leg: No edema.     Left lower leg: No edema.  Lymphadenopathy:     Cervical: No cervical adenopathy.  Skin:    Findings: No rash.  Neurological:     Mental Status: He is alert and oriented to person, place, and time.     Cranial Nerves: No cranial nerve deficit.     Deep Tendon Reflexes: Reflexes normal.  Psychiatric:        Mood and Affect: Mood and affect normal.     BP 126/74 (BP Location: Left Arm, Patient  Position: Sitting, Cuff Size: Normal)   Pulse 74   Temp 98.1 F (36.7 C) (Oral)   Ht 5' 10.5" (1.791 m)   Wt 183 lb (83 kg)   SpO2 98%   BMI 25.89 kg/m  Wt Readings from Last 3 Encounters:  07/11/20 183 lb (83 kg)  06/05/20 187 lb 12.8 oz (85.2 kg)  08/10/19 184 lb 11.2 oz (83.8 kg)     Health Maintenance Due  Topic Date Due  . Hepatitis C Screening  Never done    There are no preventive care reminders to display for this patient.  Lab Results  Component Value Date   TSH 1.93 06/14/2018   Lab Results  Component Value Date   WBC 7.6 06/14/2018   HGB 15.4 06/14/2018   HCT 45.2 06/14/2018   MCV 96.1 06/14/2018   PLT 221.0 06/14/2018   Lab Results  Component Value Date   NA 141 06/14/2018   K 4.6 06/14/2018   CO2 30 06/14/2018   GLUCOSE 106 (H) 06/14/2018   BUN 17 06/14/2018   CREATININE 0.97 06/14/2018   BILITOT 0.7 06/14/2018   ALKPHOS 58 06/14/2018   AST 17 06/14/2018   ALT 19 06/14/2018   PROT 7.0 06/14/2018   ALBUMIN 4.7 06/14/2018   CALCIUM 9.9 06/14/2018   GFR 80.11 06/14/2018   Lab Results  Component Value Date   CHOL 222 (H) 06/14/2018   Lab Results  Component Value Date   HDL 54.10 06/14/2018   Lab Results  Component Value Date   LDLCALC 150 (H) 06/14/2018   Lab Results  Component Value Date   TRIG 90.0 06/14/2018   Lab Results  Component Value Date   CHOLHDL 4 06/14/2018   Lab Results  Component Value  Date   HGBA1C 5.6 09/23/2015      Assessment & Plan:   Problem List Items Addressed This Visit   None   Visit Diagnoses    Physical exam    -  Primary   Relevant Orders   Basic metabolic panel   Lipid panel   CBC with Differential/Platelet   TSH   Hepatic function panel    -Immunizations up-to-date -We discussed Shingrix vaccine and he will check on insurance coverage -Continue regular exercise habits -Obtain follow-up labs.  We did not get PSA since he is getting this through urology -Patient requesting repeat trial  of Viagra.  Prescription sent.  Meds ordered this encounter  Medications  . sildenafil (VIAGRA) 100 MG tablet    Sig: Take 0.5-1 tablets (50-100 mg total) by mouth daily as needed for erectile dysfunction.    Dispense:  6 tablet    Refill:  11    Follow-up: No follow-ups on file.    Carolann Littler, MD

## 2020-07-12 LAB — CBC WITH DIFFERENTIAL/PLATELET
Absolute Monocytes: 564 cells/uL (ref 200–950)
Basophils Absolute: 64 cells/uL (ref 0–200)
Basophils Relative: 0.7 %
Eosinophils Absolute: 373 cells/uL (ref 15–500)
Eosinophils Relative: 4.1 %
HCT: 42.3 % (ref 38.5–50.0)
Hemoglobin: 14.6 g/dL (ref 13.2–17.1)
Lymphs Abs: 1966 cells/uL (ref 850–3900)
MCH: 33 pg (ref 27.0–33.0)
MCHC: 34.5 g/dL (ref 32.0–36.0)
MCV: 95.5 fL (ref 80.0–100.0)
MPV: 10.9 fL (ref 7.5–12.5)
Monocytes Relative: 6.2 %
Neutro Abs: 6133 cells/uL (ref 1500–7800)
Neutrophils Relative %: 67.4 %
Platelets: 237 10*3/uL (ref 140–400)
RBC: 4.43 10*6/uL (ref 4.20–5.80)
RDW: 11.8 % (ref 11.0–15.0)
Total Lymphocyte: 21.6 %
WBC: 9.1 10*3/uL (ref 3.8–10.8)

## 2020-07-12 LAB — HEPATIC FUNCTION PANEL
AG Ratio: 2.1 (calc) (ref 1.0–2.5)
ALT: 20 U/L (ref 9–46)
AST: 18 U/L (ref 10–35)
Albumin: 4.5 g/dL (ref 3.6–5.1)
Alkaline phosphatase (APISO): 59 U/L (ref 35–144)
Bilirubin, Direct: 0.1 mg/dL (ref 0.0–0.2)
Globulin: 2.1 g/dL (calc) (ref 1.9–3.7)
Indirect Bilirubin: 0.4 mg/dL (calc) (ref 0.2–1.2)
Total Bilirubin: 0.5 mg/dL (ref 0.2–1.2)
Total Protein: 6.6 g/dL (ref 6.1–8.1)

## 2020-07-12 LAB — LIPID PANEL
Cholesterol: 204 mg/dL — ABNORMAL HIGH (ref <200)
HDL: 50 mg/dL (ref >=40)
LDL Cholesterol (Calc): 129 mg/dL (calc) — ABNORMAL HIGH
Non-HDL Cholesterol (Calc): 154 mg/dL (calc) — ABNORMAL HIGH (ref <130)
Total CHOL/HDL Ratio: 4.1 (calc) (ref <5.0)
Triglycerides: 141 mg/dL (ref <150)

## 2020-07-12 LAB — BASIC METABOLIC PANEL
BUN: 20 mg/dL (ref 7–25)
CO2: 29 mmol/L (ref 20–32)
Calcium: 10 mg/dL (ref 8.6–10.3)
Chloride: 104 mmol/L (ref 98–110)
Creat: 1.18 mg/dL (ref 0.70–1.18)
Glucose, Bld: 82 mg/dL (ref 65–99)
Potassium: 5.1 mmol/L (ref 3.5–5.3)
Sodium: 141 mmol/L (ref 135–146)

## 2020-07-12 LAB — TSH: TSH: 2.63 mIU/L (ref 0.40–4.50)

## 2020-07-15 DIAGNOSIS — J3081 Allergic rhinitis due to animal (cat) (dog) hair and dander: Secondary | ICD-10-CM | POA: Diagnosis not present

## 2020-07-15 DIAGNOSIS — J301 Allergic rhinitis due to pollen: Secondary | ICD-10-CM | POA: Diagnosis not present

## 2020-07-15 DIAGNOSIS — J3089 Other allergic rhinitis: Secondary | ICD-10-CM | POA: Diagnosis not present

## 2020-07-21 ENCOUNTER — Telehealth: Payer: Self-pay | Admitting: Emergency Medicine

## 2020-07-21 MED ORDER — LEVOFLOXACIN 500 MG PO TABS: 500.0000 mg | ORAL_TABLET | Freq: Every day | ORAL | 0 refills | Status: DC

## 2020-07-21 NOTE — Telephone Encounter (Signed)
Primary Pulmonologist: Byrum Last office visit and with whom: 06/05/20 with Byrum What do we see them for (pulmonary problems): bronchiectasis Last OV assessment/plan: Assessment & Plan Note by Collene Gobble, MD at 06/05/2020 2:14 PM  Author: Collene Gobble, MD Author Type: Physician Filed: 06/05/2020 2:16 PM  Note Status: Bernell List: Cosign Not Required Encounter Date: 06/05/2020  Problem: BRONCHIECTASIS  Editor: Collene Gobble, MD (Physician)      Prior Versions: 1. Collene Gobble, MD (Physician) at 06/05/2020 2:14 PM - Written    Doing very well.  His last exacerbation/bronchitis was March 2021.  His mucus burden, cough have improved largely due to the addition of his immunotherapy.  He gets allergy shots weekly.  He is no longer requiring maintenance Allegra or fluticasone, has these available to use if he needs them.  Not clear that we need to rush to repeat his CT scan of the chest given his clinical stability.  Use your flutter valve if you need it to help clear your secretions. Continue your Mucinex as you have been taking it. We will follow-up in July 2022.  At that time we can talk about whether we need to repeat your CT scan of the chest depending on how your symptoms have been doing. COVID-19 and flu vaccines are both up-to-date.       Assessment & Plan Note by Collene Gobble, MD at 06/05/2020 2:14 PM  Author: Collene Gobble, MD Author Type: Physician Filed: 06/05/2020 2:14 PM  Note Status: Written Cosign: Cosign Not Required Encounter Date: 06/05/2020  Problem: Allergic rhinitis  Editor: Collene Gobble, MD (Physician)               Please continue getting your allergy shots (immunotherapy) as you have been doing. Use your Allegra and fluticasone nasal spray as needed for any increase in your allergy symptoms, congestion, coughing.       Patient Instructions by Collene Gobble, MD at 06/05/2020 2:00 PM  Author: Collene Gobble, MD Author Type: Physician  Filed: 06/05/2020 2:14 PM  Note Status: Addendum Cosign: Cosign Not Required Encounter Date: 06/05/2020  Editor: Collene Gobble, MD (Physician)      Prior Versions: 1. Collene Gobble, MD (Physician) at 06/05/2020 2:12 PM - Signed    Please continue getting your allergy shots (immunotherapy) as you have been doing. Use your Allegra and fluticasone nasal spray as needed for any increase in your allergy symptoms, congestion, coughing. Use your flutter valve if you need it to help clear your secretions. Continue your Mucinex as you have been taking it. We will follow-up in July 2022.  At that time we can talk about whether we need to repeat your CT scan of the chest depending on how your symptoms have been doing. COVID-19 and flu vaccines are both up-to-date.       Was appointment offered to patient (explain)?  Pt requesting meds   Reason for call: Called and spoke with pt who states he feels like he is having a flare up of his bronchiectasis as 2 days ago, pt began to have complaints with productive cough coughing up green phlegm, wheezing, and also a low grade temp. Pt stated yesterday 12/19 his temp was highest 99.1 and he took tylenol which did help with the temp. Pt has not checked temp yet today 12/20 but states that he does not feel like he is running a fever.  Pt has been using his flutter valve and also  has been taking mucinex.Temp highest 99.1 which was yesterday 12/19. Pt has been taking ibuprofen which has helped. Pt has not checked temp yet today 12/20 but states that he does not feel like he is now running any fever.  Pt is requesting meds to be prescribed to help with his symptoms (abx). Dr. Lamonte Sakai, please advise.  Allergies  Allergen Reactions  . Pollen Extract-Tree Extract [Pollen Extract]     Weed allergies also    Immunization History  Administered Date(s) Administered  . Fluad Quad(high Dose 65+) 05/06/2020  . Influenza Split 05/03/2011, 04/04/2012, 04/02/2013,  04/02/2014  . Influenza Whole 05/03/2007, 05/13/2010  . Influenza, High Dose Seasonal PF 05/15/2015, 04/21/2016, 06/02/2017, 05/04/2018, 04/27/2019, 05/28/2019, 11/26/2019, 05/26/2020  . Influenza,inj,Quad PF,6+ Mos 03/20/2014  . PFIZER SARS-COV-2 Vaccination 08/16/2019, 09/06/2019, 05/06/2020  . Pneumococcal Conjugate-13 03/20/2014  . Pneumococcal Polysaccharide-23 08/02/2002, 08/02/2005, 02/25/2010, 05/28/2019, 11/26/2019  . Td 08/02/2005  . Tdap 07/27/2016  . Varicella 08/02/2009

## 2020-07-21 NOTE — Telephone Encounter (Signed)
Please call in levaquin 500mg  qd x 7 days.  Have him call if no better

## 2020-07-21 NOTE — Telephone Encounter (Signed)
Called and spoke with patient letting him know that Dr. Lamonte Sakai would like to send in RX for levaquin for the next 7 days. Advised patient to be sure to take it in its entirety and to call if he is not feeling any better. Patient expressed understanding. RX sent to preferred pharmacy. Nothing further needed at this time.

## 2020-07-28 DIAGNOSIS — J3089 Other allergic rhinitis: Secondary | ICD-10-CM | POA: Diagnosis not present

## 2020-07-28 DIAGNOSIS — J3081 Allergic rhinitis due to animal (cat) (dog) hair and dander: Secondary | ICD-10-CM | POA: Diagnosis not present

## 2020-07-28 DIAGNOSIS — J301 Allergic rhinitis due to pollen: Secondary | ICD-10-CM | POA: Diagnosis not present

## 2020-07-31 ENCOUNTER — Encounter: Payer: Self-pay | Admitting: Family Medicine

## 2020-08-06 DIAGNOSIS — J3081 Allergic rhinitis due to animal (cat) (dog) hair and dander: Secondary | ICD-10-CM | POA: Diagnosis not present

## 2020-08-06 DIAGNOSIS — J301 Allergic rhinitis due to pollen: Secondary | ICD-10-CM | POA: Diagnosis not present

## 2020-08-06 DIAGNOSIS — J3089 Other allergic rhinitis: Secondary | ICD-10-CM | POA: Diagnosis not present

## 2020-08-12 DIAGNOSIS — J3089 Other allergic rhinitis: Secondary | ICD-10-CM | POA: Diagnosis not present

## 2020-08-12 DIAGNOSIS — J3081 Allergic rhinitis due to animal (cat) (dog) hair and dander: Secondary | ICD-10-CM | POA: Diagnosis not present

## 2020-08-12 DIAGNOSIS — J301 Allergic rhinitis due to pollen: Secondary | ICD-10-CM | POA: Diagnosis not present

## 2020-08-28 DIAGNOSIS — J301 Allergic rhinitis due to pollen: Secondary | ICD-10-CM | POA: Diagnosis not present

## 2020-08-28 DIAGNOSIS — J3089 Other allergic rhinitis: Secondary | ICD-10-CM | POA: Diagnosis not present

## 2020-08-28 DIAGNOSIS — J3081 Allergic rhinitis due to animal (cat) (dog) hair and dander: Secondary | ICD-10-CM | POA: Diagnosis not present

## 2020-09-12 DIAGNOSIS — J3081 Allergic rhinitis due to animal (cat) (dog) hair and dander: Secondary | ICD-10-CM | POA: Diagnosis not present

## 2020-09-12 DIAGNOSIS — J301 Allergic rhinitis due to pollen: Secondary | ICD-10-CM | POA: Diagnosis not present

## 2020-09-12 DIAGNOSIS — J3089 Other allergic rhinitis: Secondary | ICD-10-CM | POA: Diagnosis not present

## 2020-09-19 DIAGNOSIS — J301 Allergic rhinitis due to pollen: Secondary | ICD-10-CM | POA: Diagnosis not present

## 2020-09-19 DIAGNOSIS — J3089 Other allergic rhinitis: Secondary | ICD-10-CM | POA: Diagnosis not present

## 2020-09-19 DIAGNOSIS — J3081 Allergic rhinitis due to animal (cat) (dog) hair and dander: Secondary | ICD-10-CM | POA: Diagnosis not present

## 2020-09-25 DIAGNOSIS — J301 Allergic rhinitis due to pollen: Secondary | ICD-10-CM | POA: Diagnosis not present

## 2020-09-25 DIAGNOSIS — J3089 Other allergic rhinitis: Secondary | ICD-10-CM | POA: Diagnosis not present

## 2020-09-25 DIAGNOSIS — J3081 Allergic rhinitis due to animal (cat) (dog) hair and dander: Secondary | ICD-10-CM | POA: Diagnosis not present

## 2020-09-25 DIAGNOSIS — M25561 Pain in right knee: Secondary | ICD-10-CM | POA: Diagnosis not present

## 2020-09-25 DIAGNOSIS — M25562 Pain in left knee: Secondary | ICD-10-CM | POA: Diagnosis not present

## 2020-09-25 DIAGNOSIS — M160 Bilateral primary osteoarthritis of hip: Secondary | ICD-10-CM | POA: Diagnosis not present

## 2020-09-25 DIAGNOSIS — M170 Bilateral primary osteoarthritis of knee: Secondary | ICD-10-CM | POA: Diagnosis not present

## 2020-10-02 DIAGNOSIS — J3089 Other allergic rhinitis: Secondary | ICD-10-CM | POA: Diagnosis not present

## 2020-10-02 DIAGNOSIS — J301 Allergic rhinitis due to pollen: Secondary | ICD-10-CM | POA: Diagnosis not present

## 2020-10-02 DIAGNOSIS — J3081 Allergic rhinitis due to animal (cat) (dog) hair and dander: Secondary | ICD-10-CM | POA: Diagnosis not present

## 2020-10-06 DIAGNOSIS — J3081 Allergic rhinitis due to animal (cat) (dog) hair and dander: Secondary | ICD-10-CM | POA: Diagnosis not present

## 2020-10-06 DIAGNOSIS — J301 Allergic rhinitis due to pollen: Secondary | ICD-10-CM | POA: Diagnosis not present

## 2020-10-06 DIAGNOSIS — J3089 Other allergic rhinitis: Secondary | ICD-10-CM | POA: Diagnosis not present

## 2020-10-16 DIAGNOSIS — J301 Allergic rhinitis due to pollen: Secondary | ICD-10-CM | POA: Diagnosis not present

## 2020-10-16 DIAGNOSIS — J3081 Allergic rhinitis due to animal (cat) (dog) hair and dander: Secondary | ICD-10-CM | POA: Diagnosis not present

## 2020-10-16 DIAGNOSIS — J3089 Other allergic rhinitis: Secondary | ICD-10-CM | POA: Diagnosis not present

## 2020-10-24 DIAGNOSIS — J3081 Allergic rhinitis due to animal (cat) (dog) hair and dander: Secondary | ICD-10-CM | POA: Diagnosis not present

## 2020-10-24 DIAGNOSIS — J3089 Other allergic rhinitis: Secondary | ICD-10-CM | POA: Diagnosis not present

## 2020-10-24 DIAGNOSIS — J301 Allergic rhinitis due to pollen: Secondary | ICD-10-CM | POA: Diagnosis not present

## 2020-10-28 DIAGNOSIS — M160 Bilateral primary osteoarthritis of hip: Secondary | ICD-10-CM | POA: Diagnosis not present

## 2020-10-28 DIAGNOSIS — M25551 Pain in right hip: Secondary | ICD-10-CM | POA: Diagnosis not present

## 2020-10-28 DIAGNOSIS — M25552 Pain in left hip: Secondary | ICD-10-CM | POA: Diagnosis not present

## 2020-10-28 DIAGNOSIS — M170 Bilateral primary osteoarthritis of knee: Secondary | ICD-10-CM | POA: Diagnosis not present

## 2020-10-29 DIAGNOSIS — J3081 Allergic rhinitis due to animal (cat) (dog) hair and dander: Secondary | ICD-10-CM | POA: Diagnosis not present

## 2020-10-29 DIAGNOSIS — J3089 Other allergic rhinitis: Secondary | ICD-10-CM | POA: Diagnosis not present

## 2020-10-29 DIAGNOSIS — J301 Allergic rhinitis due to pollen: Secondary | ICD-10-CM | POA: Diagnosis not present

## 2020-11-06 DIAGNOSIS — J3089 Other allergic rhinitis: Secondary | ICD-10-CM | POA: Diagnosis not present

## 2020-11-06 DIAGNOSIS — J301 Allergic rhinitis due to pollen: Secondary | ICD-10-CM | POA: Diagnosis not present

## 2020-11-06 DIAGNOSIS — J3081 Allergic rhinitis due to animal (cat) (dog) hair and dander: Secondary | ICD-10-CM | POA: Diagnosis not present

## 2020-11-13 DIAGNOSIS — J3089 Other allergic rhinitis: Secondary | ICD-10-CM | POA: Diagnosis not present

## 2020-11-13 DIAGNOSIS — J3081 Allergic rhinitis due to animal (cat) (dog) hair and dander: Secondary | ICD-10-CM | POA: Diagnosis not present

## 2020-11-13 DIAGNOSIS — J301 Allergic rhinitis due to pollen: Secondary | ICD-10-CM | POA: Diagnosis not present

## 2020-11-14 ENCOUNTER — Ambulatory Visit: Payer: Medicare Other | Attending: Internal Medicine

## 2020-11-14 DIAGNOSIS — Z23 Encounter for immunization: Secondary | ICD-10-CM

## 2020-11-14 NOTE — Progress Notes (Signed)
   Covid-19 Vaccination Clinic  Name:  BARACK NICODEMUS    MRN: [id number] DOB: [birth year]  11/14/2020  Mr. Rog was observed post Covid-19 immunization for 15 minutes without incident. He was provided with Vaccine Information Sheet and instruction to access the V-Safe system.   Mr. Gullion was instructed to call 911 with any severe reactions post vaccine: Marland Kitchen Difficulty breathing  . Swelling of face and throat  . A fast heartbeat  . A bad rash all over body  . Dizziness and weakness   Immunizations Administered    Name Date Dose VIS Date Route   PFIZER Comrnaty(Gray TOP) Covid-19 Vaccine 11/14/2020 12:40 PM 0.3 mL 07/10/2020 Intramuscular   Manufacturer: [employer]   Lot: [id number]   NDC: [phone]

## 2020-11-20 ENCOUNTER — Other Ambulatory Visit (HOSPITAL_BASED_OUTPATIENT_CLINIC_OR_DEPARTMENT_OTHER): Payer: Self-pay

## 2020-11-20 MED ORDER — PFIZER-BIONT COVID-19 VAC-TRIS 30 MCG/0.3ML IM SUSP
INTRAMUSCULAR | 0 refills | Status: DC
  Filled 2020-11-20: qty 0.3, 1d supply, fill #0

## 2020-11-21 DIAGNOSIS — J3081 Allergic rhinitis due to animal (cat) (dog) hair and dander: Secondary | ICD-10-CM | POA: Diagnosis not present

## 2020-11-21 DIAGNOSIS — J301 Allergic rhinitis due to pollen: Secondary | ICD-10-CM | POA: Diagnosis not present

## 2020-11-21 DIAGNOSIS — J3089 Other allergic rhinitis: Secondary | ICD-10-CM | POA: Diagnosis not present

## 2020-12-01 DIAGNOSIS — J301 Allergic rhinitis due to pollen: Secondary | ICD-10-CM | POA: Diagnosis not present

## 2020-12-06 DIAGNOSIS — J3081 Allergic rhinitis due to animal (cat) (dog) hair and dander: Secondary | ICD-10-CM | POA: Diagnosis not present

## 2020-12-06 DIAGNOSIS — J3089 Other allergic rhinitis: Secondary | ICD-10-CM | POA: Diagnosis not present

## 2020-12-06 DIAGNOSIS — J301 Allergic rhinitis due to pollen: Secondary | ICD-10-CM | POA: Diagnosis not present

## 2020-12-08 DIAGNOSIS — J3081 Allergic rhinitis due to animal (cat) (dog) hair and dander: Secondary | ICD-10-CM | POA: Diagnosis not present

## 2020-12-08 DIAGNOSIS — J3089 Other allergic rhinitis: Secondary | ICD-10-CM | POA: Diagnosis not present

## 2020-12-12 DIAGNOSIS — J3089 Other allergic rhinitis: Secondary | ICD-10-CM | POA: Diagnosis not present

## 2020-12-12 DIAGNOSIS — J3081 Allergic rhinitis due to animal (cat) (dog) hair and dander: Secondary | ICD-10-CM | POA: Diagnosis not present

## 2020-12-12 DIAGNOSIS — J301 Allergic rhinitis due to pollen: Secondary | ICD-10-CM | POA: Diagnosis not present

## 2020-12-17 DIAGNOSIS — J3081 Allergic rhinitis due to animal (cat) (dog) hair and dander: Secondary | ICD-10-CM | POA: Diagnosis not present

## 2020-12-17 DIAGNOSIS — J301 Allergic rhinitis due to pollen: Secondary | ICD-10-CM | POA: Diagnosis not present

## 2020-12-17 DIAGNOSIS — J3089 Other allergic rhinitis: Secondary | ICD-10-CM | POA: Diagnosis not present

## 2020-12-22 DIAGNOSIS — J3081 Allergic rhinitis due to animal (cat) (dog) hair and dander: Secondary | ICD-10-CM | POA: Diagnosis not present

## 2020-12-22 DIAGNOSIS — J3089 Other allergic rhinitis: Secondary | ICD-10-CM | POA: Diagnosis not present

## 2020-12-22 DIAGNOSIS — J301 Allergic rhinitis due to pollen: Secondary | ICD-10-CM | POA: Diagnosis not present

## 2020-12-30 DIAGNOSIS — J3081 Allergic rhinitis due to animal (cat) (dog) hair and dander: Secondary | ICD-10-CM | POA: Diagnosis not present

## 2020-12-30 DIAGNOSIS — J301 Allergic rhinitis due to pollen: Secondary | ICD-10-CM | POA: Diagnosis not present

## 2020-12-30 DIAGNOSIS — J3089 Other allergic rhinitis: Secondary | ICD-10-CM | POA: Diagnosis not present

## 2021-01-06 DIAGNOSIS — J3081 Allergic rhinitis due to animal (cat) (dog) hair and dander: Secondary | ICD-10-CM | POA: Diagnosis not present

## 2021-01-06 DIAGNOSIS — J3089 Other allergic rhinitis: Secondary | ICD-10-CM | POA: Diagnosis not present

## 2021-01-06 DIAGNOSIS — J301 Allergic rhinitis due to pollen: Secondary | ICD-10-CM | POA: Diagnosis not present

## 2021-01-14 DIAGNOSIS — J3081 Allergic rhinitis due to animal (cat) (dog) hair and dander: Secondary | ICD-10-CM | POA: Diagnosis not present

## 2021-01-14 DIAGNOSIS — J301 Allergic rhinitis due to pollen: Secondary | ICD-10-CM | POA: Diagnosis not present

## 2021-01-14 DIAGNOSIS — J3089 Other allergic rhinitis: Secondary | ICD-10-CM | POA: Diagnosis not present

## 2021-01-26 DIAGNOSIS — J3081 Allergic rhinitis due to animal (cat) (dog) hair and dander: Secondary | ICD-10-CM | POA: Diagnosis not present

## 2021-01-26 DIAGNOSIS — J3089 Other allergic rhinitis: Secondary | ICD-10-CM | POA: Diagnosis not present

## 2021-01-26 DIAGNOSIS — J301 Allergic rhinitis due to pollen: Secondary | ICD-10-CM | POA: Diagnosis not present

## 2021-01-30 DIAGNOSIS — J301 Allergic rhinitis due to pollen: Secondary | ICD-10-CM | POA: Diagnosis not present

## 2021-01-30 DIAGNOSIS — J3081 Allergic rhinitis due to animal (cat) (dog) hair and dander: Secondary | ICD-10-CM | POA: Diagnosis not present

## 2021-01-30 DIAGNOSIS — J3089 Other allergic rhinitis: Secondary | ICD-10-CM | POA: Diagnosis not present

## 2021-02-04 DIAGNOSIS — J3089 Other allergic rhinitis: Secondary | ICD-10-CM | POA: Diagnosis not present

## 2021-02-04 DIAGNOSIS — J3081 Allergic rhinitis due to animal (cat) (dog) hair and dander: Secondary | ICD-10-CM | POA: Diagnosis not present

## 2021-02-04 DIAGNOSIS — J301 Allergic rhinitis due to pollen: Secondary | ICD-10-CM | POA: Diagnosis not present

## 2021-02-10 DIAGNOSIS — J3081 Allergic rhinitis due to animal (cat) (dog) hair and dander: Secondary | ICD-10-CM | POA: Diagnosis not present

## 2021-02-10 DIAGNOSIS — J3089 Other allergic rhinitis: Secondary | ICD-10-CM | POA: Diagnosis not present

## 2021-02-10 DIAGNOSIS — J301 Allergic rhinitis due to pollen: Secondary | ICD-10-CM | POA: Diagnosis not present

## 2021-02-23 DIAGNOSIS — J301 Allergic rhinitis due to pollen: Secondary | ICD-10-CM | POA: Diagnosis not present

## 2021-02-23 DIAGNOSIS — J3089 Other allergic rhinitis: Secondary | ICD-10-CM | POA: Diagnosis not present

## 2021-02-23 DIAGNOSIS — J3081 Allergic rhinitis due to animal (cat) (dog) hair and dander: Secondary | ICD-10-CM | POA: Diagnosis not present

## 2021-03-02 DIAGNOSIS — J3081 Allergic rhinitis due to animal (cat) (dog) hair and dander: Secondary | ICD-10-CM | POA: Diagnosis not present

## 2021-03-02 DIAGNOSIS — J301 Allergic rhinitis due to pollen: Secondary | ICD-10-CM | POA: Diagnosis not present

## 2021-03-02 DIAGNOSIS — J3089 Other allergic rhinitis: Secondary | ICD-10-CM | POA: Diagnosis not present

## 2021-03-10 DIAGNOSIS — J301 Allergic rhinitis due to pollen: Secondary | ICD-10-CM | POA: Diagnosis not present

## 2021-03-10 DIAGNOSIS — J3081 Allergic rhinitis due to animal (cat) (dog) hair and dander: Secondary | ICD-10-CM | POA: Diagnosis not present

## 2021-03-10 DIAGNOSIS — J3089 Other allergic rhinitis: Secondary | ICD-10-CM | POA: Diagnosis not present

## 2021-03-12 ENCOUNTER — Other Ambulatory Visit: Payer: Self-pay

## 2021-03-12 ENCOUNTER — Ambulatory Visit: Payer: Medicare Other | Admitting: Emergency Medicine

## 2021-03-12 ENCOUNTER — Encounter: Payer: Self-pay | Admitting: Emergency Medicine

## 2021-03-12 DIAGNOSIS — J479 Bronchiectasis, uncomplicated: Secondary | ICD-10-CM | POA: Diagnosis not present

## 2021-03-12 DIAGNOSIS — J301 Allergic rhinitis due to pollen: Secondary | ICD-10-CM | POA: Diagnosis not present

## 2021-03-12 NOTE — Patient Instructions (Addendum)
Please continue your allergy shots as you have been getting them. Use your flutter valve when you need it for mucus clearance We will hold off on checking a CT scan of the chest for now.  We can determine the timing of this depending on how your symptoms evolve Follow Dr. Lamonte Sakai in 1 year or sooner if you have any problems.

## 2021-03-12 NOTE — Assessment & Plan Note (Signed)
.    Continue immunotherapy.  He is doing great on this.

## 2021-03-12 NOTE — Progress Notes (Signed)
Subjective:    Patient ID: Eric Brock, male    DOB: 13-Jan-1941, 80 y.o.   MRN: [id number]  HPI 80 year old man who follows up for his history of bronchiectasis, chronic cough, allergic rhinitis.  Past medical history non-Hodgkin's lymphoma, prostate CA. He had exacerbation consistent with acute bronchitis in March 2021, treated with doxycycline. Today reports that he has been doing very well, overall sx have been better since he started immunotherapy.   On Allegra, fluticasone nasal spray as needed. He has been on allergy shots for the last year - has made a huge impact on his allergy sx. Barely any mucous, no flares. Clears some clear mucous most days.  He has a flutter valve, uses rarely because the mucous now clears easily.   Most recent imaging CT chest 03/01/2019 that showed medial bronchiectasis and atelectatic change in the right middle lobe and lingula, some evidence of mucoid impaction, no nodules.  COVID and flu vaccine up to date.   ROV 03/12/21 --pleasant 80 year old gentleman with a history of non-Hodgkin's lymphoma and prostate cancer.  We follow him for bronchiectasis, severe allergic rhinitis and associated chronic cough.  He is on immunotherapy and has benefited significantly.  Has been able to stop Allegra, fluticasone nasal spray.  His most recent CT scan of the chest was 01/2019. Today he reports that he is doing very well. Minimal congestion. Rare cough, therefore has not needed much flutter valve. He is active, limited some by knee pain.    Review of Systems As per HPI  Past Medical History:  Diagnosis Date   Allergic rhinitis    Arthritis    Bronchiectasis pulmologist-  dr Lamonte Sakai ([place])--  per lov note 03-22-2016 stable   intermittant--  per pt last bout yr ago 2016    Eczema    Elevated PSA    History of colon polyps    1999   History of concussion    2005 and 2010 with mild cerebral bleed resolved -no residual   History of non-Hodgkin's lymphoma  oncologist-  dr Benay Spice--  pt in remission   dx 03/ 2000  ,  Stage IA,  scalp/forehead ,  post chemo/radioation therapy   Hyperlipidemia    diet controlled, no med   Lower urinary tract symptoms (LUTS)    Prostate cancer [facility]) urologist-  dr Alyson Ingles  oncologist-  dr Alen Blew and dr manning   cT2b N0 M0, Gleason 4+3,  PSA 4.2,  vol 35.0cc--  plan IMRT w/ gold seeds and ADT   Wears glasses      Family History  Problem Relation Age of Onset   Heart disease Father 73   Cancer Brother        prostate   Brain cancer Mother    Colon cancer Neg Hx    Colon polyps Neg Hx    Esophageal cancer Neg Hx    Rectal cancer Neg Hx    Stomach cancer Neg Hx      Social History   Socioeconomic History   Marital status: Married    Spouse name: Not on file   Number of children: Not on file   Years of education: Not on file   Highest education level: Not on file  Occupational History   Occupation: retired    Fish farm manager: RETIRED  Tobacco Use   Smoking status: Former    Packs/day: 1.00    Years: 12.00    Pack years: 12.00    Types: Cigarettes    Start date:  51    Quit date: 08/03/1975    Years since quitting: 45.6   Smokeless tobacco: Never  Vaping Use   Vaping Use: Never used  Substance and Sexual Activity   Alcohol use: Yes    Alcohol/week: 7.0 standard drinks    Types: 7 Glasses of wine per week    Comment: 1 glass of wine daily   Drug use: No   Sexual activity: Yes  Other Topics Concern   Not on file  Social History Narrative   Not on file   Social Determinants of Health   Financial Resource Strain: Not on file  Food Insecurity: Not on file  Transportation Needs: Not on file  Physical Activity: Not on file  Stress: Not on file  Social Connections: Not on file  Intimate Partner Violence: Not on file     Allergies  Allergen Reactions   Pollen Extract-Tree Extract [Pollen Extract]     Weed allergies also     Outpatient Medications Prior to Visit  Medication Sig  Dispense Refill   Avanafil 200 MG TABS Take 1 tablet by mouth once a week.     COVID-19 mRNA Vac-TriS, Pfizer, (PFIZER-BIONT COVID-19 VAC-TRIS) SUSP injection Inject into the muscle. 0.3 mL 0   COVID-19 mRNA vaccine, Pfizer, 30 MCG/0.3ML injection AS DIRECTED .3 mL 0   Desoximetasone (TOPICORT) 0.25 % ointment Apply 1 application topically 2 (two) times daily. 30 g 2   EPINEPHrine 0.3 mg/0.3 mL IJ SOAJ injection Inject into the muscle.     fexofenadine (ALLEGRA) 180 MG tablet Take 180 mg by mouth every morning.     fluticasone (FLONASE) 50 MCG/ACT nasal spray SPRAY TWO SPRAYS IN EACH NOSTRIL DAILY AS NEEDED 16 g 2   GuaiFENesin (MUCINEX PO) Take 1,200 mg by mouth 2 (two) times daily. Extended release     ibuprofen (ADVIL,MOTRIN) 200 MG tablet Take 200 mg by mouth every 6 (six) hours as needed.     influenza vaccine adjuvanted (FLUAD) 0.5 ML injection TAKE AS DIRECTED .5 mL 0   levofloxacin (LEVAQUIN) 500 MG tablet Take 1 tablet (500 mg total) by mouth daily. 7 tablet 0   mometasone (ELOCON) 0.1 % cream Apply topically.     Multiple Vitamins-Minerals (CENTRUM ADULTS PO) Take 1 tablet by mouth daily.     Respiratory Therapy Supplies (FLUTTER) DEVI 1 Device by Does not apply route as needed. 1 each 0   sildenafil (VIAGRA) 100 MG tablet Take 0.5-1 tablets (50-100 mg total) by mouth daily as needed for erectile dysfunction. 6 tablet 11   No facility-administered medications prior to visit.          Objective:   Physical Exam Vitals:   03/12/21 1201  BP: (!) 154/68  Pulse: (!) 56  Temp: 98.1 F (36.7 C)  TempSrc: Oral  SpO2: 98%  Weight: 186 lb 12.8 oz (84.7 kg)  Height: '5\' 10"'$  (1.778 m)   Gen: Pleasant, well-nourished, in no distress,  normal affect  ENT: No lesions,  mouth clear,  oropharynx clear, no postnasal drip  Neck: No JVD, no stridor  Lungs: No use of accessory muscles, no crackles or wheezing on normal respiration, no wheeze on forced expiration  Cardiovascular: RRR,  heart sounds normal, no murmur or gallops, no peripheral edema  Musculoskeletal: No deformities, no cyanosis or clubbing  Neuro: alert, awake, non focal  Skin: Warm, no lesions or rash       Assessment & Plan:  BRONCHIECTASIS Overall doing very well since his allergic  rhinitis treatment was improved.  He is doing fantastic with immunotherapy.  His chest mucus burden is almost 0.  He rarely needs flutter valve.  I am going to hold off on a repeat CT scan of the chest for now.  We can determine the timing of this based on his clinical progression COVID-19 vaccine is up-to-date.  Discussed the timing of his next booster with him today.  Allergic rhinitis .  Continue immunotherapy.  He is doing great on this.  Baltazar Apo, MD, PhD 03/12/2021, 12:19 PM [facility] [phone] or if no answer [phone]

## 2021-03-12 NOTE — Assessment & Plan Note (Addendum)
Overall doing very well since his allergic rhinitis treatment was improved.  He is doing fantastic with immunotherapy.  His chest mucus burden is almost 0.  He rarely needs flutter valve.  I am going to hold off on a repeat CT scan of the chest for now.  We can determine the timing of this based on his clinical progression COVID-19 vaccine is up-to-date.  Discussed the timing of his next booster with him today.

## 2021-03-19 DIAGNOSIS — J3081 Allergic rhinitis due to animal (cat) (dog) hair and dander: Secondary | ICD-10-CM | POA: Diagnosis not present

## 2021-03-19 DIAGNOSIS — J3089 Other allergic rhinitis: Secondary | ICD-10-CM | POA: Diagnosis not present

## 2021-03-19 DIAGNOSIS — J301 Allergic rhinitis due to pollen: Secondary | ICD-10-CM | POA: Diagnosis not present

## 2021-03-23 DIAGNOSIS — J3089 Other allergic rhinitis: Secondary | ICD-10-CM | POA: Diagnosis not present

## 2021-03-23 DIAGNOSIS — J3081 Allergic rhinitis due to animal (cat) (dog) hair and dander: Secondary | ICD-10-CM | POA: Diagnosis not present

## 2021-03-23 DIAGNOSIS — J301 Allergic rhinitis due to pollen: Secondary | ICD-10-CM | POA: Diagnosis not present

## 2021-04-02 DIAGNOSIS — J301 Allergic rhinitis due to pollen: Secondary | ICD-10-CM | POA: Diagnosis not present

## 2021-04-02 DIAGNOSIS — J3089 Other allergic rhinitis: Secondary | ICD-10-CM | POA: Diagnosis not present

## 2021-04-02 DIAGNOSIS — J3081 Allergic rhinitis due to animal (cat) (dog) hair and dander: Secondary | ICD-10-CM | POA: Diagnosis not present

## 2021-04-06 ENCOUNTER — Encounter: Payer: Self-pay | Admitting: Family Medicine

## 2021-04-07 DIAGNOSIS — J3081 Allergic rhinitis due to animal (cat) (dog) hair and dander: Secondary | ICD-10-CM | POA: Diagnosis not present

## 2021-04-07 DIAGNOSIS — J3089 Other allergic rhinitis: Secondary | ICD-10-CM | POA: Diagnosis not present

## 2021-04-07 DIAGNOSIS — J301 Allergic rhinitis due to pollen: Secondary | ICD-10-CM | POA: Diagnosis not present

## 2021-04-08 ENCOUNTER — Other Ambulatory Visit: Payer: Self-pay

## 2021-04-08 ENCOUNTER — Ambulatory Visit (INDEPENDENT_AMBULATORY_CARE_PROVIDER_SITE_OTHER): Payer: Medicare Other | Admitting: Family Medicine

## 2021-04-08 VITALS — BP 140/70 | HR 62 | Temp 98.3°F | Wt 184.4 lb

## 2021-04-08 DIAGNOSIS — K439 Ventral hernia without obstruction or gangrene: Secondary | ICD-10-CM | POA: Diagnosis not present

## 2021-04-08 DIAGNOSIS — R21 Rash and other nonspecific skin eruption: Secondary | ICD-10-CM | POA: Diagnosis not present

## 2021-04-08 DIAGNOSIS — Z23 Encounter for immunization: Secondary | ICD-10-CM

## 2021-04-08 DIAGNOSIS — K429 Umbilical hernia without obstruction or gangrene: Secondary | ICD-10-CM | POA: Diagnosis not present

## 2021-04-08 MED ORDER — CICLOPIROX OLAMINE 0.77 % EX CREA: TOPICAL_CREAM | Freq: Two times a day (BID) | CUTANEOUS | 1 refills | Status: DC

## 2021-04-08 NOTE — Progress Notes (Signed)
Established Patient Office Visit  Subjective:  Patient ID: Eric Brock, male    DOB: 1940/12/18  Age: 80 y.o. MRN: [id number]  CC:  Chief Complaint  Patient presents with   Follow-up    Folow up on hernia    HPI Eric Brock presents for the following items.  He has history of prostate cancer, history of non-Hodgkin's lymphoma, history of hyperlipidemia.  His prostate cancer was treated with radiation therapy.  He has possible hernia mass anterior ventral abdomen.  Sometimes has a pressure sensation and this pushes out when he sits forward but no significant pain.  Not changing in size recently.  Also has small umbilical hernia but nontender.  He had read up about strangulation issues and had concerns whether this could develop strangulation. Appetite and weight stable.  No recent fever.  Has had prior history of umbilical hernia repair.  Second issue is groin rash which is slightly scaly in the right groin which he noticed couple weeks ago.  Minimally symptomatic.  Nonpainful.  Has not tried anything topically on this rash.  Past Medical History:  Diagnosis Date   Allergic rhinitis    Arthritis    Bronchiectasis pulmologist-  dr Lamonte Sakai ([place])--  per lov note 03-22-2016 stable   intermittant--  per pt last bout yr ago 2016    Eczema    Elevated PSA    History of colon polyps    1999   History of concussion    2005 and 2010 with mild cerebral bleed resolved -no residual   History of non-Hodgkin's lymphoma oncologist-  dr Benay Spice--  pt in remission   dx 03/ 2000  ,  Stage IA,  scalp/forehead ,  post chemo/radioation therapy   Hyperlipidemia    diet controlled, no med   Lower urinary tract symptoms (LUTS)    Prostate cancer [facility]) urologist-  dr Alyson Ingles  oncologist-  dr Alen Blew and dr manning   cT2b N0 M0, Gleason 4+3,  PSA 4.2,  vol 35.0cc--  plan IMRT w/ gold seeds and ADT   Wears glasses     Past Surgical History:  Procedure Laterality Date   CATARACT  EXTRACTION W/ INTRAOCULAR LENS  IMPLANT, BILATERAL  2013   CHOLECYSTECTOMY  1994   COLONOSCOPY  last one 07-14-2015   GOLD SEED IMPLANT N/A 04/16/2016   Procedure: GOLD SEED IMPLANT;  Surgeon: Cleon Gustin, MD;  Location[facility];  Service: Urology;  Laterality: N/A;   PROSTATE BIOPSY N/A 01/12/2016   Procedure: BIOPSY TRANSRECTAL ULTRASONIC PROSTATE (TUBP);  Surgeon: Cleon Gustin, MD;  Location: [facility];  Service: Urology;  Laterality: N/A;   UMBILICAL HERNIA REPAIR  1992    Family History  Problem Relation Age of Onset   Heart disease Father 23   Cancer Brother        prostate   Brain cancer Mother    Colon cancer Neg Hx    Colon polyps Neg Hx    Esophageal cancer Neg Hx    Rectal cancer Neg Hx    Stomach cancer Neg Hx     Social History   Socioeconomic History   Marital status: Married    Spouse name: Not on file   Number of children: Not on file   Years of education: Not on file   Highest education level: Not on file  Occupational History   Occupation: retired    Fish farm manager: RETIRED  Tobacco Use   Smoking status: Former  Packs/day: 1.00    Years: 12.00    Pack years: 12.00    Types: Cigarettes    Start date: 5    Quit date: 08/03/1975    Years since quitting: 45.7   Smokeless tobacco: Never  Vaping Use   Vaping Use: Never used  Substance and Sexual Activity   Alcohol use: Yes    Alcohol/week: 7.0 standard drinks    Types: 7 Glasses of wine per week    Comment: 1 glass of wine daily   Drug use: No   Sexual activity: Yes  Other Topics Concern   Not on file  Social History Narrative   Not on file   Social Determinants of Health   Financial Resource Strain: Not on file  Food Insecurity: Not on file  Transportation Needs: Not on file  Physical Activity: Not on file  Stress: Not on file  Social Connections: Not on file  Intimate Partner Violence: Not on file    Outpatient Medications Prior to Visit   Medication Sig Dispense Refill   Avanafil 200 MG TABS Take 1 tablet by mouth once a week.     COVID-19 mRNA Vac-TriS, Pfizer, (PFIZER-BIONT COVID-19 VAC-TRIS) SUSP injection Inject into the muscle. 0.3 mL 0   COVID-19 mRNA vaccine, Pfizer, 30 MCG/0.3ML injection AS DIRECTED .3 mL 0   Desoximetasone (TOPICORT) 0.25 % ointment Apply 1 application topically 2 (two) times daily. 30 g 2   EPINEPHrine 0.3 mg/0.3 mL IJ SOAJ injection Inject into the muscle.     fexofenadine (ALLEGRA) 180 MG tablet Take 180 mg by mouth every morning.     fluticasone (FLONASE) 50 MCG/ACT nasal spray SPRAY TWO SPRAYS IN EACH NOSTRIL DAILY AS NEEDED 16 g 2   GuaiFENesin (MUCINEX PO) Take 1,200 mg by mouth 2 (two) times daily. Extended release     ibuprofen (ADVIL,MOTRIN) 200 MG tablet Take 200 mg by mouth every 6 (six) hours as needed.     influenza vaccine adjuvanted (FLUAD) 0.5 ML injection TAKE AS DIRECTED .5 mL 0   levofloxacin (LEVAQUIN) 500 MG tablet Take 1 tablet (500 mg total) by mouth daily. 7 tablet 0   mometasone (ELOCON) 0.1 % cream Apply topically.     Multiple Vitamins-Minerals (CENTRUM ADULTS PO) Take 1 tablet by mouth daily.     Respiratory Therapy Supplies (FLUTTER) DEVI 1 Device by Does not apply route as needed. 1 each 0   sildenafil (VIAGRA) 100 MG tablet Take 0.5-1 tablets (50-100 mg total) by mouth daily as needed for erectile dysfunction. 6 tablet 11   No facility-administered medications prior to visit.    Allergies  Allergen Reactions   Pollen Extract-Tree Extract [Pollen Extract]     Weed allergies also    ROS Review of Systems  Constitutional:  Negative for appetite change, chills, fever and unexpected weight change.  Gastrointestinal:  Negative for abdominal pain, nausea and vomiting.  Skin:  Positive for rash.     Objective:    Physical Exam Vitals reviewed.  Constitutional:      Appearance: Normal appearance.  Cardiovascular:     Rate and Rhythm: Normal rate and regular  rhythm.  Pulmonary:     Effort: Pulmonary effort is normal.     Breath sounds: Normal breath sounds.  Abdominal:     Comments: Very small umbilical hernia which is soft and nontender.  He has ventral hernia which is soft and nontender.  No other abdominal masses palpated  Skin:    Findings: Rash present.  Comments: Somewhat linear area of rash right groin.  This is mildly erythematous base and scaly surface.  No pustules.  Neurological:     Mental Status: He is alert.    BP 140/70 (BP Location: Left Arm, Patient Position: Sitting, Cuff Size: Normal)   Pulse 62   Temp 98.3 F (36.8 C) (Oral)   Wt 184 lb 6.4 oz (83.6 kg)   SpO2 97%   BMI 26.46 kg/m  Wt Readings from Last 3 Encounters:  04/08/21 184 lb 6.4 oz (83.6 kg)  03/12/21 186 lb 12.8 oz (84.7 kg)  07/11/20 183 lb (83 kg)     Health Maintenance Due  Topic Date Due   Hepatitis C Screening  Never done   Zoster Vaccines- Shingrix (1 of 2) Never done   COVID-19 Vaccine (5 - Booster for Pfizer series) 03/16/2021    There are no preventive care reminders to display for this patient.  Lab Results  Component Value Date   TSH 2.63 07/11/2020   Lab Results  Component Value Date   WBC 9.1 07/11/2020   HGB 14.6 07/11/2020   HCT 42.3 07/11/2020   MCV 95.5 07/11/2020   PLT 237 07/11/2020   Lab Results  Component Value Date   NA 141 07/11/2020   K 5.1 07/11/2020   CO2 29 07/11/2020   GLUCOSE 82 07/11/2020   BUN 20 07/11/2020   CREATININE 1.18 07/11/2020   BILITOT 0.5 07/11/2020   ALKPHOS 58 06/14/2018   AST 18 07/11/2020   ALT 20 07/11/2020   PROT 6.6 07/11/2020   ALBUMIN 4.7 06/14/2018   CALCIUM 10.0 07/11/2020   GFR 80.11 06/14/2018   Lab Results  Component Value Date   CHOL 204 (H) 07/11/2020   Lab Results  Component Value Date   HDL 50 07/11/2020   Lab Results  Component Value Date   LDLCALC 129 (H) 07/11/2020   Lab Results  Component Value Date   TRIG 141 07/11/2020   Lab Results   Component Value Date   CHOLHDL 4.1 07/11/2020   Lab Results  Component Value Date   HGBA1C 5.6 09/23/2015      Assessment & Plan:   #1 small umbilical hernia.  Probable ventral hernia versus diastases recti anterior abdomen.  Basically asymptomatic at this time.  Reassurance.  We have not recommended surgical consultation unless this becomes painful or causes other concern.  We did review signs and symptoms of strangulation to watch out for  #2 right groin rash.  Suspect fungal. -Keep area dry as possible -Start Loprox cream twice daily and be in touch if this is not resolving in 2 to 3 weeks.  #3 health maintenance-patient also requesting flu vaccine.  This was given today.   Meds ordered this encounter  Medications   ciclopirox (LOPROX) 0.77 % cream    Sig: Apply topically 2 (two) times daily.    Dispense:  15 g    Refill:  1    Follow-up: No follow-ups on file.    Carolann Littler, MD

## 2021-04-15 DIAGNOSIS — R6889 Other general symptoms and signs: Secondary | ICD-10-CM | POA: Diagnosis not present

## 2021-04-27 DIAGNOSIS — J3089 Other allergic rhinitis: Secondary | ICD-10-CM | POA: Diagnosis not present

## 2021-04-27 DIAGNOSIS — J301 Allergic rhinitis due to pollen: Secondary | ICD-10-CM | POA: Diagnosis not present

## 2021-04-27 DIAGNOSIS — J3081 Allergic rhinitis due to animal (cat) (dog) hair and dander: Secondary | ICD-10-CM | POA: Diagnosis not present

## 2021-05-08 DIAGNOSIS — J3081 Allergic rhinitis due to animal (cat) (dog) hair and dander: Secondary | ICD-10-CM | POA: Diagnosis not present

## 2021-05-08 DIAGNOSIS — J3089 Other allergic rhinitis: Secondary | ICD-10-CM | POA: Diagnosis not present

## 2021-05-08 DIAGNOSIS — J301 Allergic rhinitis due to pollen: Secondary | ICD-10-CM | POA: Diagnosis not present

## 2021-05-15 DIAGNOSIS — J3089 Other allergic rhinitis: Secondary | ICD-10-CM | POA: Diagnosis not present

## 2021-05-15 DIAGNOSIS — J301 Allergic rhinitis due to pollen: Secondary | ICD-10-CM | POA: Diagnosis not present

## 2021-05-15 DIAGNOSIS — J3081 Allergic rhinitis due to animal (cat) (dog) hair and dander: Secondary | ICD-10-CM | POA: Diagnosis not present

## 2021-05-26 DIAGNOSIS — J3081 Allergic rhinitis due to animal (cat) (dog) hair and dander: Secondary | ICD-10-CM | POA: Diagnosis not present

## 2021-05-26 DIAGNOSIS — J301 Allergic rhinitis due to pollen: Secondary | ICD-10-CM | POA: Diagnosis not present

## 2021-05-26 DIAGNOSIS — J3089 Other allergic rhinitis: Secondary | ICD-10-CM | POA: Diagnosis not present

## 2021-05-26 DIAGNOSIS — H1045 Other chronic allergic conjunctivitis: Secondary | ICD-10-CM | POA: Diagnosis not present

## 2021-05-26 DIAGNOSIS — J479 Bronchiectasis, uncomplicated: Secondary | ICD-10-CM | POA: Diagnosis not present

## 2021-06-01 DIAGNOSIS — J3081 Allergic rhinitis due to animal (cat) (dog) hair and dander: Secondary | ICD-10-CM | POA: Diagnosis not present

## 2021-06-01 DIAGNOSIS — J301 Allergic rhinitis due to pollen: Secondary | ICD-10-CM | POA: Diagnosis not present

## 2021-06-01 DIAGNOSIS — J3089 Other allergic rhinitis: Secondary | ICD-10-CM | POA: Diagnosis not present

## 2021-06-11 DIAGNOSIS — J3081 Allergic rhinitis due to animal (cat) (dog) hair and dander: Secondary | ICD-10-CM | POA: Diagnosis not present

## 2021-06-11 DIAGNOSIS — J3089 Other allergic rhinitis: Secondary | ICD-10-CM | POA: Diagnosis not present

## 2021-06-19 DIAGNOSIS — J3089 Other allergic rhinitis: Secondary | ICD-10-CM | POA: Diagnosis not present

## 2021-06-19 DIAGNOSIS — J3081 Allergic rhinitis due to animal (cat) (dog) hair and dander: Secondary | ICD-10-CM | POA: Diagnosis not present

## 2021-06-19 DIAGNOSIS — J301 Allergic rhinitis due to pollen: Secondary | ICD-10-CM | POA: Diagnosis not present

## 2021-06-24 DIAGNOSIS — J3089 Other allergic rhinitis: Secondary | ICD-10-CM | POA: Diagnosis not present

## 2021-06-24 DIAGNOSIS — J3081 Allergic rhinitis due to animal (cat) (dog) hair and dander: Secondary | ICD-10-CM | POA: Diagnosis not present

## 2021-06-24 DIAGNOSIS — J301 Allergic rhinitis due to pollen: Secondary | ICD-10-CM | POA: Diagnosis not present

## 2021-06-29 ENCOUNTER — Telehealth: Payer: Self-pay | Admitting: Emergency Medicine

## 2021-06-29 MED ORDER — MOLNUPIRAVIR 200 MG PO CAPS: 4.0000 | ORAL_CAPSULE | Freq: Two times a day (BID) | ORAL | 0 refills | Status: AC

## 2021-06-29 NOTE — Telephone Encounter (Signed)
Called and spoke to patient about Tammy Parret's recommendation. Pt declined video visit at this time and stated that Levaquin usually helps him when feeling this way.   Tammy Parrett Please advise:

## 2021-06-29 NOTE — Telephone Encounter (Signed)
Called and spoke with patient. He verbalized understanding of TP's recommendations. He will keep the appt for tomorrow.   Nothing further needed at time of call.

## 2021-06-29 NOTE — Telephone Encounter (Signed)
Spoke with the pt  He is c/o increased chest congestion and cough x 3 days  He had temp last night of 100 and mild body aches  He is coughing up some grey colored sputum Denies any SOB or wheezing but he states he is "gurgling in chest"  He is taking mucinex bid, flonase, allergy injections  He states is fully vaccinated against covid and has had flu vacccine  He has not been tested for covid bc his home tests he had are expired  Please advise thanks!  Allergies  Allergen Reactions   Pollen Extract-Tree Extract [Pollen Extract]     Weed allergies also  ;ler

## 2021-06-29 NOTE — Telephone Encounter (Signed)
Recommend keeping appointment with Volanda Napoleon NP tomorrow. Do recommend antiviral pack as he has high risk with age and history of lung problems.  Begin Molnupiravir x 5 days .  (No recent lab work.  Has potential drug interactions for PAX )   Rx was sent to the pharmacy.  Continue with previous recommendations with Mucinex DM twice daily as needed for cough and congestion.  Tylenol for fever.  Fluids and rest. Keep office visit for video visit tomorrow as planned as additional medications may be indicated.

## 2021-06-29 NOTE — Telephone Encounter (Signed)
Patient needs to test for COVID.  Call back if positive Set up a video visit for tomorrow May use Mucinex DM twice daily as needed for cough and congestion Tylenol as needed for fever.  Fluids and rest.  If symptoms are not improving or worsen he can seek emergency room or urgent care Please contact office for sooner follow up if symptoms do not improve or worsen or seek emergency care

## 2021-06-29 NOTE — Telephone Encounter (Signed)
Pt called back and states that he took at home covid test and it it POS  He is scheduled for video visit with Beth at 4:30 pm tomorrow  He asks if he needs to start any abx or antiviral toniight

## 2021-06-30 ENCOUNTER — Other Ambulatory Visit: Payer: Self-pay

## 2021-06-30 ENCOUNTER — Telehealth (INDEPENDENT_AMBULATORY_CARE_PROVIDER_SITE_OTHER): Payer: Medicare Other | Admitting: Primary Care

## 2021-06-30 ENCOUNTER — Encounter: Payer: Self-pay | Admitting: Primary Care

## 2021-06-30 VITALS — Temp 99.9°F

## 2021-06-30 DIAGNOSIS — U071 COVID-19: Secondary | ICD-10-CM | POA: Diagnosis not present

## 2021-06-30 MED ORDER — ALBUTEROL SULFATE HFA 108 (90 BASE) MCG/ACT IN AERS: 2.0000 | INHALATION_SPRAY | Freq: Four times a day (QID) | RESPIRATORY_TRACT | 1 refills | Status: DC | PRN

## 2021-06-30 NOTE — Progress Notes (Signed)
Virtual Visit via Video Note  I connected with Eric Brock on 06/30/21 at  4:30 PM EST by a video enabled telemedicine application and verified that I am speaking with the correct person using two identifiers.  Location: Patient: Home Provider: Office   I discussed the limitations of evaluation and management by telemedicine and the availability of in person appointments. The patient expressed understanding and agreed to proceed.  History of Present Illness: 80 year old male, former smoker. PMH significant for bronchiectasis. Patient of Dr. Lamonte Sakai, last seen on 03/12/21.  06/30/2021- Interim hx  Patient contacted today for acute video visit. Patient develop cough on Sunday 11/27. Associated low grade temperature. He tested positive for covid the following day on 11/28 and was sent in [place] x 5 days. He has started antiviral yesterday, currently on day #2. His cough was slightly purulent but turned clear today. He has been taking mucinex twice daily as prescribed. He is not been using flutter valve. No other respiratory complaints or shortness of breath. Wife also has a cough but tested negative. They are planning on having company next week.   Observations/Objective:  - Appear well; No shortness of breath or wheezing. He is able to speak in full sentences  Imaging: 02/28/21 CT chest wo contrast >> Similar pattern of medial bronchiectasis and atelectasis in the medial segment of the RIGHT middle lobe and medial segment of the lingula. There is evidence of endobronchial filling defects within the lingula as well as the RIGHT lower lobe and RIGHT middle lobe bronchus suggesting a component mucoid impaction. No suspicious pulmonary nodules.  Assessment and Plan:  Covid-19: - Patient tested positive for COVID-19 on 06/29/21. He is on day #2 of antiviral, Molnupiravir 800mg  BID x 5 days. Appears stable. He has a productive cough with clear mucus. Denies shortness of breath symptoms.  Advised he take tylenol 650mg  every 4-6 hours as needed for fever, continue mucinex 600-1200mg  twice daily and use flutter valve to help with chest congestion. Sent in RX Albuterol hfa to have on hand in case he develops any shortness of breath/wheezing. He will notify our office he respiratory symptoms worsen or if sputum turns purulent.   Follow Up Instructions:   - As needed if symptoms worsen  I discussed the assessment and treatment plan with the patient. The patient was provided an opportunity to ask questions and all were answered. The patient agreed with the plan and demonstrated an understanding of the instructions.   The patient was advised to call back or seek an in-person evaluation if the symptoms worsen or if the condition fails to improve as anticipated.  I provided 22 minutes of non-face-to-face time during this encounter.   Martyn Ehrich, NP

## 2021-07-02 ENCOUNTER — Telehealth: Payer: Self-pay | Admitting: Primary Care

## 2021-07-02 MED ORDER — AMOXICILLIN-POT CLAVULANATE 875-125 MG PO TABS: 1.0000 | ORAL_TABLET | Freq: Two times a day (BID) | ORAL | 0 refills | Status: DC

## 2021-07-02 NOTE — Telephone Encounter (Signed)
Called and spoke with the pt  He will finish molnupiravir 07/03/21  Talking mucinex  He has started to cough up minimal green and brown sputum over the past 24 hours  He is asking if Beth feels he needs abx  His temp this morning was 99.4  No wheezing, chest tightness and he states SOB at his normal baseline  Please advise, thanks!  Allergies  Allergen Reactions   Pollen Extract-Tree Extract [Pollen Extract]     Weed allergies also

## 2021-07-02 NOTE — Telephone Encounter (Signed)
Call made to patient, confirmed DOB. Made aware of EW recommendations. Confirmed pharmacy.   Nothing further needed at this time.

## 2021-07-02 NOTE — Telephone Encounter (Signed)
Yes I agree, I have sent in Augmentin.

## 2021-07-16 DIAGNOSIS — H524 Presbyopia: Secondary | ICD-10-CM | POA: Diagnosis not present

## 2021-07-16 DIAGNOSIS — H35363 Drusen (degenerative) of macula, bilateral: Secondary | ICD-10-CM | POA: Diagnosis not present

## 2021-07-16 DIAGNOSIS — Z961 Presence of intraocular lens: Secondary | ICD-10-CM | POA: Diagnosis not present

## 2021-07-20 DIAGNOSIS — J3081 Allergic rhinitis due to animal (cat) (dog) hair and dander: Secondary | ICD-10-CM | POA: Diagnosis not present

## 2021-07-20 DIAGNOSIS — J301 Allergic rhinitis due to pollen: Secondary | ICD-10-CM | POA: Diagnosis not present

## 2021-07-20 DIAGNOSIS — J3089 Other allergic rhinitis: Secondary | ICD-10-CM | POA: Diagnosis not present

## 2021-07-30 DIAGNOSIS — J3089 Other allergic rhinitis: Secondary | ICD-10-CM | POA: Diagnosis not present

## 2021-07-30 DIAGNOSIS — J3081 Allergic rhinitis due to animal (cat) (dog) hair and dander: Secondary | ICD-10-CM | POA: Diagnosis not present

## 2021-07-30 DIAGNOSIS — J301 Allergic rhinitis due to pollen: Secondary | ICD-10-CM | POA: Diagnosis not present

## 2021-08-07 DIAGNOSIS — J3081 Allergic rhinitis due to animal (cat) (dog) hair and dander: Secondary | ICD-10-CM | POA: Diagnosis not present

## 2021-08-07 DIAGNOSIS — J301 Allergic rhinitis due to pollen: Secondary | ICD-10-CM | POA: Diagnosis not present

## 2021-08-07 DIAGNOSIS — J3089 Other allergic rhinitis: Secondary | ICD-10-CM | POA: Diagnosis not present

## 2021-08-14 DIAGNOSIS — J3081 Allergic rhinitis due to animal (cat) (dog) hair and dander: Secondary | ICD-10-CM | POA: Diagnosis not present

## 2021-08-14 DIAGNOSIS — J301 Allergic rhinitis due to pollen: Secondary | ICD-10-CM | POA: Diagnosis not present

## 2021-08-14 DIAGNOSIS — J3089 Other allergic rhinitis: Secondary | ICD-10-CM | POA: Diagnosis not present

## 2021-08-28 DIAGNOSIS — J3081 Allergic rhinitis due to animal (cat) (dog) hair and dander: Secondary | ICD-10-CM | POA: Diagnosis not present

## 2021-08-28 DIAGNOSIS — J3089 Other allergic rhinitis: Secondary | ICD-10-CM | POA: Diagnosis not present

## 2021-08-28 DIAGNOSIS — J301 Allergic rhinitis due to pollen: Secondary | ICD-10-CM | POA: Diagnosis not present

## 2021-09-01 DIAGNOSIS — J3089 Other allergic rhinitis: Secondary | ICD-10-CM | POA: Diagnosis not present

## 2021-09-01 DIAGNOSIS — J301 Allergic rhinitis due to pollen: Secondary | ICD-10-CM | POA: Diagnosis not present

## 2021-09-11 DIAGNOSIS — J301 Allergic rhinitis due to pollen: Secondary | ICD-10-CM | POA: Diagnosis not present

## 2021-09-11 DIAGNOSIS — J3081 Allergic rhinitis due to animal (cat) (dog) hair and dander: Secondary | ICD-10-CM | POA: Diagnosis not present

## 2021-09-11 DIAGNOSIS — J3089 Other allergic rhinitis: Secondary | ICD-10-CM | POA: Diagnosis not present

## 2021-09-15 DIAGNOSIS — J3081 Allergic rhinitis due to animal (cat) (dog) hair and dander: Secondary | ICD-10-CM | POA: Diagnosis not present

## 2021-09-15 DIAGNOSIS — J301 Allergic rhinitis due to pollen: Secondary | ICD-10-CM | POA: Diagnosis not present

## 2021-09-15 DIAGNOSIS — J3089 Other allergic rhinitis: Secondary | ICD-10-CM | POA: Diagnosis not present

## 2021-09-22 DIAGNOSIS — J3081 Allergic rhinitis due to animal (cat) (dog) hair and dander: Secondary | ICD-10-CM | POA: Diagnosis not present

## 2021-09-22 DIAGNOSIS — J301 Allergic rhinitis due to pollen: Secondary | ICD-10-CM | POA: Diagnosis not present

## 2021-09-22 DIAGNOSIS — J3089 Other allergic rhinitis: Secondary | ICD-10-CM | POA: Diagnosis not present

## 2021-09-30 ENCOUNTER — Encounter: Payer: Self-pay | Admitting: Family Medicine

## 2021-09-30 ENCOUNTER — Ambulatory Visit (INDEPENDENT_AMBULATORY_CARE_PROVIDER_SITE_OTHER): Payer: PPO | Admitting: Family Medicine

## 2021-09-30 VITALS — BP 120/70 | HR 80 | Temp 97.6°F | Ht 69.0 in | Wt 187.7 lb

## 2021-09-30 DIAGNOSIS — Z1159 Encounter for screening for other viral diseases: Secondary | ICD-10-CM | POA: Diagnosis not present

## 2021-09-30 DIAGNOSIS — L570 Actinic keratosis: Secondary | ICD-10-CM

## 2021-09-30 DIAGNOSIS — Z0000 Encounter for general adult medical examination without abnormal findings: Secondary | ICD-10-CM | POA: Diagnosis not present

## 2021-09-30 LAB — HEPATIC FUNCTION PANEL
ALT: 16 U/L (ref 0–53)
AST: 17 U/L (ref 0–37)
Albumin: 4.2 g/dL (ref 3.5–5.2)
Alkaline Phosphatase: 55 U/L (ref 39–117)
Bilirubin, Direct: 0.1 mg/dL (ref 0.0–0.3)
Total Bilirubin: 0.6 mg/dL (ref 0.2–1.2)
Total Protein: 7.3 g/dL (ref 6.0–8.3)

## 2021-09-30 LAB — CBC WITH DIFFERENTIAL/PLATELET
Basophils Absolute: 0.1 10*3/uL (ref 0.0–0.1)
Basophils Relative: 0.9 % (ref 0.0–3.0)
Eosinophils Absolute: 0.5 10*3/uL (ref 0.0–0.7)
Eosinophils Relative: 6.7 % — ABNORMAL HIGH (ref 0.0–5.0)
HCT: 44.4 % (ref 39.0–52.0)
Hemoglobin: 14.9 g/dL (ref 13.0–17.0)
Lymphocytes Relative: 18.9 % (ref 12.0–46.0)
Lymphs Abs: 1.4 10*3/uL (ref 0.7–4.0)
MCHC: 33.5 g/dL (ref 30.0–36.0)
MCV: 95.7 fl (ref 78.0–100.0)
Monocytes Absolute: 0.4 10*3/uL (ref 0.1–1.0)
Monocytes Relative: 5.8 % (ref 3.0–12.0)
Neutro Abs: 5 10*3/uL (ref 1.4–7.7)
Neutrophils Relative %: 67.7 % (ref 43.0–77.0)
Platelets: 221 10*3/uL (ref 150.0–400.0)
RBC: 4.64 Mil/uL (ref 4.22–5.81)
RDW: 13.3 % (ref 11.5–15.5)
WBC: 7.4 10*3/uL (ref 4.0–10.5)

## 2021-09-30 LAB — LIPID PANEL
Cholesterol: 197 mg/dL (ref 0–200)
HDL: 44.7 mg/dL (ref >39.00)
LDL Cholesterol: 131 mg/dL — ABNORMAL HIGH (ref 0–99)
NonHDL: 152.65
Total CHOL/HDL Ratio: 4
Triglycerides: 108 mg/dL (ref 0.0–149.0)
VLDL: 21.6 mg/dL (ref 0.0–40.0)

## 2021-09-30 LAB — BASIC METABOLIC PANEL
BUN: 18 mg/dL (ref 6–23)
CO2: 28 mEq/L (ref 19–32)
Calcium: 9.7 mg/dL (ref 8.4–10.5)
Chloride: 104 mEq/L (ref 96–112)
Creatinine, Ser: 1.04 mg/dL (ref 0.40–1.50)
GFR: 68.7 mL/min (ref >60.00)
Glucose, Bld: 97 mg/dL (ref 70–99)
Potassium: 4.7 mEq/L (ref 3.5–5.1)
Sodium: 139 mEq/L (ref 135–145)

## 2021-09-30 LAB — TSH: TSH: 2.96 u[IU]/mL (ref 0.35–5.50)

## 2021-09-30 MED ORDER — CICLOPIROX OLAMINE 0.77 % EX CREA: TOPICAL_CREAM | Freq: Two times a day (BID) | CUTANEOUS | 1 refills | Status: DC

## 2021-09-30 NOTE — Patient Instructions (Signed)
Consider Shingrix vaccine and check in insurance coverage.   ?

## 2021-09-30 NOTE — Progress Notes (Signed)
Established Patient Office Visit  Subjective:  Patient ID: Eric Brock, male    DOB: Jul 11, 1941  Age: 81 y.o. MRN: [id number]  CC:  Chief Complaint  Patient presents with   Annual Exam    HPI Eric Brock presents for annual exam.  He has history of prostate cancer.  Had multiple radiation treatments years ago and had just 1 hormone injection but did not tolerate.  He also has remote history of non-Hodgkin's lymphoma several years ago.  Quit smoking way back in 1977.  Question bronchiectasis but no ongoing pulmonary symptoms.  Generally doing well.  He retired at age 44.  Health maintenance reviewed  -No history of hepatitis C screening but low risk -Tetanus up-to-date -Flu vaccine already given -Pneumonia vaccines complete -Aged out of further colonoscopies -Gets annual PSA through his urologist  Social history-retired from [employer] age 50.  He is married.  He has 2 sons.  4 grandchildren.  Quit smoking 1977.  No regular alcohol use.  Family history-mother had a brain tumor.  Unknown type.  Father had coronary disease in his 51s.  Brother with prostate cancer.  His brother actually died recently of complications of metastatic prostate cancer.  Past Medical History:  Diagnosis Date   Allergic rhinitis    Arthritis    Bronchiectasis pulmologist-  dr Lamonte Sakai ([place])--  per lov note 03-22-2016 stable   intermittant--  per pt last bout yr ago 2016    Eczema    Elevated PSA    History of colon polyps    1999   History of concussion    2005 and 2010 with mild cerebral bleed resolved -no residual   History of non-Hodgkin's lymphoma oncologist-  dr Benay Spice--  pt in remission   dx 03/ 2000  ,  Stage IA,  scalp/forehead ,  post chemo/radioation therapy   Hyperlipidemia    diet controlled, no med   Lower urinary tract symptoms (LUTS)    Prostate cancer [facility]) urologist-  dr Alyson Ingles  oncologist-  dr Alen Blew and dr manning   cT2b N0 M0, Gleason 4+3,  PSA 4.2,   vol 35.0cc--  plan IMRT w/ gold seeds and ADT   Wears glasses     Past Surgical History:  Procedure Laterality Date   CATARACT EXTRACTION W/ INTRAOCULAR LENS  IMPLANT, BILATERAL  2013   CHOLECYSTECTOMY  1994   COLONOSCOPY  last one 07-14-2015   GOLD SEED IMPLANT N/A 04/16/2016   Procedure: GOLD SEED IMPLANT;  Surgeon: Cleon Gustin, MD;  Location: [facility];  Service: Urology;  Laterality: N/A;   PROSTATE BIOPSY N/A 01/12/2016   Procedure: BIOPSY TRANSRECTAL ULTRASONIC PROSTATE (TUBP);  Surgeon: Cleon Gustin, MD;  Location: [facility];  Service: Urology;  Laterality: N/A;   UMBILICAL HERNIA REPAIR  1992    Family History  Problem Relation Age of Onset   Heart disease Father 49   Cancer Brother        prostate   Brain cancer Mother    Colon cancer Neg Hx    Colon polyps Neg Hx    Esophageal cancer Neg Hx    Rectal cancer Neg Hx    Stomach cancer Neg Hx     Social History   Socioeconomic History   Marital status: Married    Spouse name: Not on file   Number of children: Not on file   Years of education: Not on file   Highest education level: Not on file  Occupational History   Occupation: retired    Fish farm manager: RETIRED  Tobacco Use   Smoking status: Former    Packs/day: 1.00    Years: 12.00    Pack years: 12.00    Types: Cigarettes    Start date: 1962    Quit date: 08/03/1975    Years since quitting: 46.1   Smokeless tobacco: Never  Vaping Use   Vaping Use: Never used  Substance and Sexual Activity   Alcohol use: Yes    Alcohol/week: 7.0 standard drinks    Types: 7 Glasses of wine per week    Comment: 1 glass of wine daily   Drug use: No   Sexual activity: Yes  Other Topics Concern   Not on file  Social History Narrative   Not on file   Social Determinants of Health   Financial Resource Strain: Not on file  Food Insecurity: Not on file  Transportation Needs: Not on file  Physical Activity: Not on file  Stress:  Not on file  Social Connections: Not on file  Intimate Partner Violence: Not on file    Outpatient Medications Prior to Visit  Medication Sig Dispense Refill   albuterol (VENTOLIN HFA) 108 (90 Base) MCG/ACT inhaler Inhale 2 puffs into the lungs every 6 (six) hours as needed for wheezing or shortness of breath. 8 g 1   Desoximetasone (TOPICORT) 0.25 % ointment Apply 1 application topically 2 (two) times daily. 30 g 2   EPINEPHrine 0.3 mg/0.3 mL IJ SOAJ injection Inject into the muscle.     fexofenadine (ALLEGRA) 180 MG tablet Take 180 mg by mouth every morning.     fluticasone (FLONASE) 50 MCG/ACT nasal spray SPRAY TWO SPRAYS IN EACH NOSTRIL DAILY AS NEEDED 16 g 2   GuaiFENesin (MUCINEX PO) Take 1,200 mg by mouth 2 (two) times daily. Extended release     ibuprofen (ADVIL,MOTRIN) 200 MG tablet Take 200 mg by mouth every 6 (six) hours as needed.     mometasone (ELOCON) 0.1 % cream Apply topically.     Multiple Vitamins-Minerals (CENTRUM ADULTS PO) Take 1 tablet by mouth daily.     Respiratory Therapy Supplies (FLUTTER) DEVI 1 Device by Does not apply route as needed. 1 each 0   sildenafil (VIAGRA) 100 MG tablet Take 0.5-1 tablets (50-100 mg total) by mouth daily as needed for erectile dysfunction. 6 tablet 11   amoxicillin-clavulanate (AUGMENTIN) 875-125 MG tablet Take 1 tablet by mouth 2 (two) times daily. 10 tablet 0   ciclopirox (LOPROX) 0.77 % cream Apply topically 2 (two) times daily. 15 g 1   COVID-19 mRNA Vac-TriS, Pfizer, (PFIZER-BIONT COVID-19 VAC-TRIS) SUSP injection Inject into the muscle. 0.3 mL 0   Avanafil 200 MG TABS Take 1 tablet by mouth once a week. (Patient not taking: Reported on 09/30/2021)     No facility-administered medications prior to visit.    Allergies  Allergen Reactions   Pollen Extract-Tree Extract [Pollen Extract]     Weed allergies also    ROS Review of Systems  Constitutional:  Negative for activity change, appetite change, fatigue and fever.  HENT:   Negative for congestion, ear pain and trouble swallowing.   Eyes:  Negative for pain and visual disturbance.  Respiratory:  Negative for cough, shortness of breath and wheezing.   Cardiovascular:  Negative for chest pain and palpitations.  Gastrointestinal:  Negative for abdominal distention, abdominal pain, blood in stool, constipation, diarrhea, nausea, rectal pain and vomiting.  Genitourinary:  Negative for dysuria, hematuria  and testicular pain.  Musculoskeletal:  Negative for arthralgias and joint swelling.  Skin:  Negative for rash.  Neurological:  Negative for dizziness, syncope and headaches.  Hematological:  Negative for adenopathy.  Psychiatric/Behavioral:  Negative for confusion and dysphoric mood.      Objective:    Physical Exam Constitutional:      General: He is not in acute distress.    Appearance: He is well-developed.  HENT:     Head: Normocephalic and atraumatic.     Right Ear: External ear normal.     Left Ear: External ear normal.  Eyes:     Conjunctiva/sclera: Conjunctivae normal.     Pupils: Pupils are equal, round, and reactive to light.  Neck:     Thyroid: No thyromegaly.  Cardiovascular:     Rate and Rhythm: Normal rate and regular rhythm.     Heart sounds: Normal heart sounds. No murmur heard. Pulmonary:     Effort: No respiratory distress.     Breath sounds: No wheezing or rales.  Abdominal:     General: Bowel sounds are normal. There is no distension.     Palpations: Abdomen is soft. There is no mass.     Tenderness: There is no abdominal tenderness. There is no guarding or rebound.     Comments: He does have umbilical hernia which is small nontender and also ventral hernia which again is nontender  Musculoskeletal:     Cervical back: Normal range of motion and neck supple.  Lymphadenopathy:     Cervical: No cervical adenopathy.  Skin:    Comments: Small scaly patch on his left frontal scalp which is about 2 x 3 mm.  Not ulcerative.   Neurological:     Mental Status: He is alert and oriented to person, place, and time.     Cranial Nerves: No cranial nerve deficit.    BP 120/70 (BP Location: Left Arm, Patient Position: Sitting, Cuff Size: Normal)    Pulse 80    Temp 97.6 F (36.4 C) (Oral)    Ht 5\' 9"  (1.753 m)    Wt 187 lb 11.2 oz (85.1 kg)    SpO2 97%    BMI 27.72 kg/m  Wt Readings from Last 3 Encounters:  09/30/21 187 lb 11.2 oz (85.1 kg)  04/08/21 184 lb 6.4 oz (83.6 kg)  03/12/21 186 lb 12.8 oz (84.7 kg)     Health Maintenance Due  Topic Date Due   Hepatitis C Screening  Never done   Zoster Vaccines- Shingrix (1 of 2) Never done   COVID-19 Vaccine (5 - Booster for Pfizer series) 01/09/2021    There are no preventive care reminders to display for this patient.  Lab Results  Component Value Date   TSH 2.63 07/11/2020   Lab Results  Component Value Date   WBC 9.1 07/11/2020   HGB 14.6 07/11/2020   HCT 42.3 07/11/2020   MCV 95.5 07/11/2020   PLT 237 07/11/2020   Lab Results  Component Value Date   NA 141 07/11/2020   K 5.1 07/11/2020   CO2 29 07/11/2020   GLUCOSE 82 07/11/2020   BUN 20 07/11/2020   CREATININE 1.18 07/11/2020   BILITOT 0.5 07/11/2020   ALKPHOS 58 06/14/2018   AST 18 07/11/2020   ALT 20 07/11/2020   PROT 6.6 07/11/2020   ALBUMIN 4.7 06/14/2018   CALCIUM 10.0 07/11/2020   GFR 80.11 06/14/2018   Lab Results  Component Value Date   CHOL 204 (H) 07/11/2020  Lab Results  Component Value Date   HDL 50 07/11/2020   Lab Results  Component Value Date   LDLCALC 129 (H) 07/11/2020   Lab Results  Component Value Date   TRIG 141 07/11/2020   Lab Results  Component Value Date   CHOLHDL 4.1 07/11/2020   Lab Results  Component Value Date   HGBA1C 5.6 09/23/2015      Assessment & Plan:   Problem List Items Addressed This Visit   None Visit Diagnoses     Physical exam    -  Primary   Relevant Orders   Basic metabolic panel   Lipid panel   CBC with  Differential/Platelet   TSH   Hepatic function panel     -Discussed Shingrix vaccine and he will consider. -Continue annual flu vaccine -Aged out of further colonoscopies.  He has small actinic keratosis of the left frontal scalp.  Has been treated previously several years ago with liquid nitrogen.  We discussed risk of treatment including pain, blistering, low risk of infection.  Patient consented.  Treated raised scaly actinic keratosis left frontal scalp and patient tolerated well.  Meds ordered this encounter  Medications   ciclopirox (LOPROX) 0.77 % cream    Sig: Apply topically 2 (two) times daily.    Dispense:  15 g    Refill:  1    Follow-up: No follow-ups on file.    Carolann Littler, MD

## 2021-10-01 DIAGNOSIS — J3089 Other allergic rhinitis: Secondary | ICD-10-CM | POA: Diagnosis not present

## 2021-10-01 DIAGNOSIS — J301 Allergic rhinitis due to pollen: Secondary | ICD-10-CM | POA: Diagnosis not present

## 2021-10-01 DIAGNOSIS — J3081 Allergic rhinitis due to animal (cat) (dog) hair and dander: Secondary | ICD-10-CM | POA: Diagnosis not present

## 2021-10-01 LAB — HEPATITIS C ANTIBODY
Hepatitis C Ab: NONREACTIVE
SIGNAL TO CUT-OFF: <0.02 (ref <1.00)

## 2021-10-06 DIAGNOSIS — J3089 Other allergic rhinitis: Secondary | ICD-10-CM | POA: Diagnosis not present

## 2021-10-06 DIAGNOSIS — J301 Allergic rhinitis due to pollen: Secondary | ICD-10-CM | POA: Diagnosis not present

## 2021-10-06 DIAGNOSIS — J3081 Allergic rhinitis due to animal (cat) (dog) hair and dander: Secondary | ICD-10-CM | POA: Diagnosis not present

## 2021-10-13 DIAGNOSIS — J3089 Other allergic rhinitis: Secondary | ICD-10-CM | POA: Diagnosis not present

## 2021-10-13 DIAGNOSIS — J301 Allergic rhinitis due to pollen: Secondary | ICD-10-CM | POA: Diagnosis not present

## 2021-10-13 DIAGNOSIS — J3081 Allergic rhinitis due to animal (cat) (dog) hair and dander: Secondary | ICD-10-CM | POA: Diagnosis not present

## 2021-10-20 DIAGNOSIS — J301 Allergic rhinitis due to pollen: Secondary | ICD-10-CM | POA: Diagnosis not present

## 2021-10-20 DIAGNOSIS — J3081 Allergic rhinitis due to animal (cat) (dog) hair and dander: Secondary | ICD-10-CM | POA: Diagnosis not present

## 2021-10-20 DIAGNOSIS — J3089 Other allergic rhinitis: Secondary | ICD-10-CM | POA: Diagnosis not present

## 2021-10-27 DIAGNOSIS — J3081 Allergic rhinitis due to animal (cat) (dog) hair and dander: Secondary | ICD-10-CM | POA: Diagnosis not present

## 2021-10-27 DIAGNOSIS — J301 Allergic rhinitis due to pollen: Secondary | ICD-10-CM | POA: Diagnosis not present

## 2021-10-27 DIAGNOSIS — J3089 Other allergic rhinitis: Secondary | ICD-10-CM | POA: Diagnosis not present

## 2021-11-03 DIAGNOSIS — J3089 Other allergic rhinitis: Secondary | ICD-10-CM | POA: Diagnosis not present

## 2021-11-03 DIAGNOSIS — J3081 Allergic rhinitis due to animal (cat) (dog) hair and dander: Secondary | ICD-10-CM | POA: Diagnosis not present

## 2021-11-03 DIAGNOSIS — J301 Allergic rhinitis due to pollen: Secondary | ICD-10-CM | POA: Diagnosis not present

## 2021-11-11 DIAGNOSIS — J301 Allergic rhinitis due to pollen: Secondary | ICD-10-CM | POA: Diagnosis not present

## 2021-11-11 DIAGNOSIS — J3089 Other allergic rhinitis: Secondary | ICD-10-CM | POA: Diagnosis not present

## 2021-11-11 DIAGNOSIS — J3081 Allergic rhinitis due to animal (cat) (dog) hair and dander: Secondary | ICD-10-CM | POA: Diagnosis not present

## 2021-11-15 ENCOUNTER — Other Ambulatory Visit: Payer: Self-pay

## 2021-11-15 ENCOUNTER — Encounter (HOSPITAL_BASED_OUTPATIENT_CLINIC_OR_DEPARTMENT_OTHER): Payer: Self-pay | Admitting: Emergency Medicine

## 2021-11-15 ENCOUNTER — Emergency Department (HOSPITAL_BASED_OUTPATIENT_CLINIC_OR_DEPARTMENT_OTHER)
Admission: EM | Admit: 2021-11-15 | Discharge: 2021-11-15 | Disposition: A | Discharge status: Home / Self Care | Payer: PPO | Attending: Emergency Medicine | Admitting: Emergency Medicine

## 2021-11-15 ENCOUNTER — Emergency Department (HOSPITAL_BASED_OUTPATIENT_CLINIC_OR_DEPARTMENT_OTHER): Payer: PPO

## 2021-11-15 DIAGNOSIS — R42 Dizziness and giddiness: Secondary | ICD-10-CM | POA: Insufficient documentation

## 2021-11-15 DIAGNOSIS — Z8546 Personal history of malignant neoplasm of prostate: Secondary | ICD-10-CM | POA: Diagnosis not present

## 2021-11-15 DIAGNOSIS — D72829 Elevated white blood cell count, unspecified: Secondary | ICD-10-CM | POA: Diagnosis not present

## 2021-11-15 LAB — COMPREHENSIVE METABOLIC PANEL
ALT: 17 U/L (ref 0–44)
AST: 16 U/L (ref 15–41)
Albumin: 4.7 g/dL (ref 3.5–5.0)
Alkaline Phosphatase: 51 U/L (ref 38–126)
Anion gap: 10 (ref 5–15)
BUN: 17 mg/dL (ref 8–23)
CO2: 26 mmol/L (ref 22–32)
Calcium: 9.7 mg/dL (ref 8.9–10.3)
Chloride: 103 mmol/L (ref 98–111)
Creatinine, Ser: 0.91 mg/dL (ref 0.61–1.24)
GFR, Estimated: >60 mL/min (ref >60)
Glucose, Bld: 102 mg/dL — ABNORMAL HIGH (ref 70–99)
Potassium: 3.8 mmol/L (ref 3.5–5.1)
Sodium: 139 mmol/L (ref 135–145)
Total Bilirubin: 0.7 mg/dL (ref 0.3–1.2)
Total Protein: 7.3 g/dL (ref 6.5–8.1)

## 2021-11-15 LAB — CBC WITH DIFFERENTIAL/PLATELET
Abs Immature Granulocytes: 0.01 10*3/uL (ref 0.00–0.07)
Basophils Absolute: 0.1 10*3/uL (ref 0.0–0.1)
Basophils Relative: 1 %
Eosinophils Absolute: 0.2 10*3/uL (ref 0.0–0.5)
Eosinophils Relative: 2 %
HCT: 43.9 % (ref 39.0–52.0)
Hemoglobin: 14.8 g/dL (ref 13.0–17.0)
Immature Granulocytes: 0 %
Lymphocytes Relative: 14 %
Lymphs Abs: 1.5 10*3/uL (ref 0.7–4.0)
MCH: 31.5 pg (ref 26.0–34.0)
MCHC: 33.7 g/dL (ref 30.0–36.0)
MCV: 93.4 fL (ref 80.0–100.0)
Monocytes Absolute: 0.4 10*3/uL (ref 0.1–1.0)
Monocytes Relative: 4 %
Neutro Abs: 8.3 10*3/uL — ABNORMAL HIGH (ref 1.7–7.7)
Neutrophils Relative %: 79 %
Platelets: 220 10*3/uL (ref 150–400)
RBC: 4.7 MIL/uL (ref 4.22–5.81)
RDW: 12.5 % (ref 11.5–15.5)
WBC: 10.6 10*3/uL — ABNORMAL HIGH (ref 4.0–10.5)
nRBC: 0 % (ref 0.0–0.2)

## 2021-11-15 LAB — TROPONIN I (HIGH SENSITIVITY): Troponin I (High Sensitivity): 5 ng/L (ref <18)

## 2021-11-15 NOTE — ED Triage Notes (Signed)
Pt c/o dizziness onset at 12:30 pm that has since resolved. Pt denies chest pain, no change in vision, or weakness. ?

## 2021-11-15 NOTE — Discharge Instructions (Addendum)
[facility] today to be evaluated for your episode of lightheadedness.  Your physical exam and lab results were reassuring.  Please follow-up closely with your primary care doctor for repeat evaluation. ? ?Get help right away if: ?You vomit or have diarrhea and are unable to eat or drink anything. ?You have problems talking, walking, swallowing, or using your arms, hands, or legs. ?You feel generally weak. ?You have any bleeding. ?You are not thinking clearly or you have trouble forming sentences. It may take a friend or family member to notice this. ?You have chest pain, abdominal pain, shortness of breath, or sweating. ?Your vision changes or you develop a severe headache. ?

## 2021-11-15 NOTE — ED Provider Notes (Signed)
?[facility] EMERGENCY DEPT ?Provider Note ? ? ?CSN: [id number] ?Arrival date & time: 11/15/21  1322 ? ?  ? ?History ? ?Chief Complaint  ?Patient presents with  ? Dizziness  ? ? ?Eric Brock is a 81 y.o. male with history of non-Hodgkin's lymphoma status postchemotherapy and radiation in 2000, prostate cancer status post chemotherapy, hyperlipidemia, eczema. ? ?Presents to the emergency department with a chief complaint of lightheadedness.  Patient states that today at approximately 1230 while driving he had a episode of lightheadedness.  Patient states that he additionally had a "sensation," throughout his head but started with his lightheadedness.  Patient states that he pulled over and stop the vehicle.  Patient's lightheadedness  and the "sensation," lasted for a few seconds before resolving.  Patient was able to drive after this incident.  Incident was witnessed by his wife who reports that patient did not have any facial asymmetry or slurred speech. ? ?Patient denies any numbness, weakness, facial asymmetry, dysarthria, visual disturbance, headache, neck pain, neck stiffness, back pain, chest pain, palpitations, leg swelling or tenderness, abdominal pain, nausea, vomiting, diarrhea, syncope. ? ?Patient endorses drinking 1 glass of wine every night.  Denies any illicit drug use.  Denies any recent falls or injuries. ? ? ?Dizziness ?Associated symptoms: no chest pain, no headaches, no nausea, no palpitations, no shortness of breath, no vomiting and no weakness   ? ?  ? ?Home Medications ?Prior to Admission medications   ?Medication Sig Start Date End Date Taking? Authorizing Provider  ?albuterol (VENTOLIN HFA) 108 (90 Base) MCG/ACT inhaler Inhale 2 puffs into the lungs every 6 (six) hours as needed for wheezing or shortness of breath. 06/30/21   Martyn Ehrich, NP  ?Avanafil 200 MG TABS Take 1 tablet by mouth once a week. ?Patient not taking: Reported on 09/30/2021    Cleon Gustin, MD   ?ciclopirox (LOPROX) 0.77 % cream Apply topically 2 (two) times daily. 09/30/21   Burchette, Alinda Sierras, MD  ?Desoximetasone (TOPICORT) 0.25 % ointment Apply 1 application topically 2 (two) times daily. 11/09/19   Burchette, Alinda Sierras, MD  ?EPINEPHrine 0.3 mg/0.3 mL IJ SOAJ injection Inject into the muscle. 03/05/20   [provider]  ?fexofenadine (ALLEGRA) 180 MG tablet Take 180 mg by mouth every morning.    [provider]  ?fluticasone (FLONASE) 50 MCG/ACT nasal spray SPRAY TWO SPRAYS IN EACH NOSTRIL DAILY AS NEEDED 08/30/17   Collene Gobble, MD  ?GuaiFENesin (MUCINEX PO) Take 1,200 mg by mouth 2 (two) times daily. Extended release    [provider]  ?ibuprofen (ADVIL,MOTRIN) 200 MG tablet Take 200 mg by mouth every 6 (six) hours as needed.    [provider]  ?mometasone (ELOCON) 0.1 % cream Apply topically. 12/12/19   [provider]  ?Multiple Vitamins-Minerals (CENTRUM ADULTS PO) Take 1 tablet by mouth daily.    [provider]  ?Respiratory Therapy Supplies (FLUTTER) DEVI 1 Device by Does not apply route as needed. 11/08/17   Rigoberto Noel, MD  ?sildenafil (VIAGRA) 100 MG tablet Take 0.5-1 tablets (50-100 mg total) by mouth daily as needed for erectile dysfunction. 07/11/20   Burchette, Alinda Sierras, MD  ?   ? ?Allergies    ?Pollen extract-tree extract [pollen extract]   ? ?Review of Systems   ?Review of Systems  ?Constitutional:  Negative for chills and fever.  ?Eyes:  Negative for visual disturbance.  ?Respiratory:  Negative for shortness of breath.   ?Cardiovascular:  Negative for  chest pain, palpitations and leg swelling.  ?Gastrointestinal:  Negative for abdominal pain, nausea and vomiting.  ?Genitourinary:  Negative for difficulty urinating, dysuria, frequency and hematuria.  ?Musculoskeletal:  Negative for back pain and neck pain.  ?Skin:  Negative for color change and rash.  ?Neurological:  Positive for light-headedness. Negative for dizziness, tremors,  seizures, syncope, facial asymmetry, speech difficulty, weakness, numbness and headaches.  ?Psychiatric/Behavioral:  Negative for confusion.   ? ?Physical Exam ?Updated Vital Signs ?BP (!) 176/88 (BP Location: Right Arm)   Pulse 66   Temp 98.4 ?F (36.9 ?C)   Resp 20   Ht '5\' 10"'$  (1.778 m)   Wt 83.9 kg   SpO2 98%   BMI 26.54 kg/m?  ?Physical Exam ?Vitals and nursing note reviewed.  ?Constitutional:   ?   General: He is not in acute distress. ?   Appearance: He is not ill-appearing, toxic-appearing or diaphoretic.  ?Eyes:  ?   General:     ?   Right eye: No discharge.     ?   Left eye: No discharge.  ?   Extraocular Movements: Extraocular movements intact.  ?   Conjunctiva/sclera: Conjunctivae normal.  ?   Pupils: Pupils are equal, round, and reactive to light.  ?Neck:  ?   Vascular: No carotid bruit.  ?Cardiovascular:  ?   Rate and Rhythm: Normal rate.  ?   Pulses:     ?     Carotid pulses are 2+ on the right side and 2+ on the left side. ?     Radial pulses are 2+ on the right side and 2+ on the left side.  ?   Heart sounds: Normal heart sounds, S1 normal and S2 normal. No murmur heard. ?Pulmonary:  ?   Effort: Pulmonary effort is normal. No tachypnea, bradypnea or respiratory distress.  ?   Breath sounds: Wheezing present.  ?   Comments: Minimal expiratory wheezing noted to all lung fields.  Patient reports that this is baseline for him due to his bronchiectasis ?Musculoskeletal:  ?   Cervical back: Normal range of motion and neck supple. No rigidity.  ?   Right lower leg: No edema.  ?   Left lower leg: No edema.  ?Skin: ?   General: Skin is warm and dry.  ?Neurological:  ?   General: No focal deficit present.  ?   Mental Status: He is alert and oriented to person, place, and time.  ?   GCS: GCS eye subscore is 4. GCS verbal subscore is 5. GCS motor subscore is 6.  ?   Cranial Nerves: No cranial nerve deficit or facial asymmetry.  ?   Sensory: Sensation is intact.  ?   Motor: No weakness, tremor, seizure  activity or pronator drift.  ?   Coordination: Romberg sign negative. Finger-Nose-Finger Test normal.  ?   Gait: Gait is intact. Gait normal.  ?   Comments: CN II-XII intact, equal grip strength, +5 strength to bilateral upper and lower extremities, sensation to light touch grossly intact to bilateral upper and lower extremities  ?Psychiatric:     ?   Behavior: Behavior is cooperative.  ? ? ?ED Results / Procedures / Treatments   ?Labs ?(all labs ordered are listed, but only abnormal results are displayed) ?Labs Reviewed  ?COMPREHENSIVE METABOLIC PANEL - Abnormal; Notable for the following components:  ?    Result Value  ? Glucose, Bld 102 (*)   ? All other components within normal limits  ?  CBC WITH DIFFERENTIAL/PLATELET - Abnormal; Notable for the following components:  ? WBC 10.6 (*)   ? Neutro Abs 8.3 (*)   ? All other components within normal limits  ?TROPONIN I (HIGH SENSITIVITY)  ? ? ?EKG ?None ? ?Radiology ?DG Chest Portable 1 View ? ?Result Date: 11/15/2021 ?CLINICAL DATA:  Episode of dizziness, now resolved. No chest complaints. EXAM: PORTABLE CHEST 1 VIEW COMPARISON:  CT chest dated March 01, 2019. Chest x-ray dated April 09, 2014. FINDINGS: The heart size and mediastinal contours are within normal limits. Normal pulmonary vascularity. Unchanged scarring in the medial right middle lobe and lingula. No focal consolidation, pleural effusion, or pneumothorax. No acute osseous abnormality. Unchanged chondroid lesion in the right proximal humerus, likely an enchondroma. IMPRESSION: 1. No active disease. Electronically Signed   By: Titus Dubin M.D.   On: 11/15/2021 15:15   ? ?Procedures ?Procedures  ? ? ?Medications Ordered in ED ?Medications - No data to display ? ?ED Course/ Medical Decision Making/ A&P ?  ?                        ?Medical Decision Making ?Amount and/or Complexity of Data Reviewed ?Labs: ordered. ?Radiology: ordered. ? ? ?Alert 81 year old male in no acute distress, nontoxic-appearing.   Presents to the emergency department with a chief plaint of lightheadedness. ? ?Information obtained from patient and patient's wife.  Past medical records were reviewed including previous ER note, labs, and imaging.  Fraser Din

## 2021-11-16 ENCOUNTER — Telehealth: Payer: Self-pay | Admitting: Family Medicine

## 2021-11-16 NOTE — Progress Notes (Signed)
? ?HPI: ? ?[name] is a 81 y.o. male with hx of non-Hodgkin's lymphoma status postchemotherapy and radiation in 2000, prostate cancer status post chemotherapy, hyperlipidemia, and allergies here today to follow on recent ED visit. ?Evaluated in the ED on 11/15/21 because of lightheadedness but he states hat he is not sure if it was this or something more serious. ?He would like to have a "scan." ? ?While he was driving back home on 11/15/21 he felt like something coming down his head, occipital to neck, like a "shade." He felt very anxious and slowed down speed,did pull over. It lasted about 10 seconds. ?His wife was with him and she did not noted any MS change or skin color changes. ?He did not so any thing unusual this day, ate breakfast as usual, no stressful events. ?Negative for visual changes, dysphagia,CP,palpitation,focal weakness,nausea,or edema. ? ?He mentions that he has been thirsty and had some frequent urination x 2 while waiting in the ED. ?Negative for dysuria or decreased urine output. ?Negative for unusual cough, SOB, or wheezing. Reporting Hx of bronchiolectasis, he follows with pulmonologist. ? ?CXR on 11/15/21:Negative for active disease. ?Lab Results  ?Component Value Date  ? CREATININE 0.91 11/15/2021  ? BUN 17 11/15/2021  ? NA 139 11/15/2021  ? K 3.8 11/15/2021  ? CL 103 11/15/2021  ? CO2 26 11/15/2021  ? ?Lab Results  ?Component Value Date  ? WBC 10.6 (H) 11/15/2021  ? HGB 14.8 11/15/2021  ? HCT 43.9 11/15/2021  ? MCV 93.4 11/15/2021  ? PLT 220 11/15/2021  ? ?Lab Results  ?Component Value Date  ? ALT 17 11/15/2021  ? AST 16 11/15/2021  ? ALKPHOS 51 11/15/2021  ? BILITOT 0.7 11/15/2021  ? ?EKG:  ?Today he has a "slight" parieto-occipital headache, some finger tingling,and feels legs like "wobbling."  ?Negative for recent illness,fever,or changes in appetite. ? ?Review of Systems  ?Constitutional:  Negative for activity change, chills and unexpected weight change.  ?HENT:  Negative  for ear pain, mouth sores and sore throat.   ?Eyes:  Negative for redness and visual disturbance.  ?Gastrointestinal:  Negative for abdominal pain and vomiting.  ?     Negative for changes in bowel habits.  ?Endocrine: Negative for cold intolerance and heat intolerance.  ?Genitourinary:  Negative for flank pain and hematuria.  ?Skin:  Negative for pallor and rash.  ?Allergic/Immunologic: Positive for environmental allergies.  ?Neurological:  Negative for seizures, syncope and facial asymmetry.  ?Psychiatric/Behavioral:  Negative for confusion. The patient is nervous/anxious.   ?Rest see pertinent positives and negatives per HPI. ? ?Current Outpatient Medications on File Prior to Visit  ?Medication Sig Dispense Refill  ? albuterol (VENTOLIN HFA) 108 (90 Base) MCG/ACT inhaler Inhale 2 puffs into the lungs every 6 (six) hours as needed for wheezing or shortness of breath. 8 g 1  ? Avanafil 200 MG TABS Take 1 tablet by mouth once a week.    ? ciclopirox (LOPROX) 0.77 % cream Apply topically 2 (two) times daily. 15 g 1  ? Desoximetasone (TOPICORT) 0.25 % ointment Apply 1 application topically 2 (two) times daily. 30 g 2  ? EPINEPHrine 0.3 mg/0.3 mL IJ SOAJ injection Inject into the muscle.    ? fexofenadine (ALLEGRA) 180 MG tablet Take 180 mg by mouth every morning.    ? fluticasone (FLONASE) 50 MCG/ACT nasal spray SPRAY TWO SPRAYS IN EACH NOSTRIL DAILY AS NEEDED 16 g 2  ? GuaiFENesin (MUCINEX PO) Take 1,200 mg by mouth 2 (  two) times daily. Extended release    ? ibuprofen (ADVIL,MOTRIN) 200 MG tablet Take 200 mg by mouth every 6 (six) hours as needed.    ? mometasone (ELOCON) 0.1 % cream Apply topically.    ? Multiple Vitamins-Minerals (CENTRUM ADULTS PO) Take 1 tablet by mouth daily.    ? Respiratory Therapy Supplies (FLUTTER) DEVI 1 Device by Does not apply route as needed. 1 each 0  ? sildenafil (VIAGRA) 100 MG tablet Take 0.5-1 tablets (50-100 mg total) by mouth daily as needed for erectile dysfunction. 6 tablet 11   ? ?No current facility-administered medications on file prior to visit.  ? ?Past Medical History:  ?Diagnosis Date  ? Allergic rhinitis   ? Arthritis   ? Bronchiectasis pulmologist-  dr Lamonte Sakai ([place])--  per lov note 03-22-2016 stable  ? intermittant--  per pt last bout yr ago 2016   ? Eczema   ? Elevated PSA   ? History of colon polyps   ? 1999  ? History of concussion   ? 2005 and 2010 with mild cerebral bleed resolved -no residual  ? History of non-Hodgkin's lymphoma oncologist-  dr Benay Spice--  pt in remission  ? dx 03/ 2000  ,  Stage IA,  scalp/forehead ,  post chemo/radioation therapy  ? Hyperlipidemia   ? diet controlled, no med  ? Lower urinary tract symptoms (LUTS)   ? Prostate cancer [facility]) urologist-  dr Alyson Ingles  oncologist-  dr Alen Blew and dr Tammi Klippel  ? cT2b N0 M0, Gleason 4+3,  PSA 4.2,  vol 35.0cc--  plan IMRT w/ gold seeds and ADT  ? Wears glasses   ? ?Allergies  ?Allergen Reactions  ? Pollen Extract-Tree Extract [Pollen Extract]   ?  Weed allergies also  ? ? ?Social History  ? ?Socioeconomic History  ? Marital status: Married  ?  Spouse name: Not on file  ? Number of children: Not on file  ? Years of education: Not on file  ? Highest education level: Not on file  ?Occupational History  ? Occupation: retired  ?  Employer: RETIRED  ?Tobacco Use  ? Smoking status: Former  ?  Packs/day: 1.00  ?  Years: 12.00  ?  Pack years: 12.00  ?  Types: Cigarettes  ?  Start date: 1962  ?  Quit date: 08/03/1975  ?  Years since quitting: 46.3  ? Smokeless tobacco: Never  ?Vaping Use  ? Vaping Use: Never used  ?Substance and Sexual Activity  ? Alcohol use: Yes  ?  Alcohol/week: 7.0 standard drinks  ?  Types: 7 Glasses of wine per week  ?  Comment: 1 glass of wine daily  ? Drug use: No  ? Sexual activity: Yes  ?Other Topics Concern  ? Not on file  ?Social History Narrative  ? Not on file  ? ?Social Determinants of Health  ? ?Financial Resource Strain: Not on file  ?Food Insecurity: Not on file  ?Transportation Needs: Not  on file  ?Physical Activity: Not on file  ?Stress: Not on file  ?Social Connections: Not on file  ? ?Vitals:  ? 11/17/21 1023  ?BP: 128/74  ?Pulse: (!) 59  ?Resp: 16  ?SpO2: 97%  ? ?Body mass index is 26.4 kg/m?. ? ?Physical Exam ?Vitals and nursing note reviewed.  ?Constitutional:   ?   General: He is not in acute distress. ?   Appearance: He is well-developed.  ?HENT:  ?   Head: Normocephalic and atraumatic.  ?   Mouth/Throat:  ?  Mouth: Mucous membranes are dry.  ?Eyes:  ?   Conjunctiva/sclera: Conjunctivae normal.  ?Cardiovascular:  ?   Rate and Rhythm: Regular rhythm. Bradycardia present.  ?   Pulses:     ?     Dorsalis pedis pulses are 2+ on the right side and 2+ on the left side.  ?   Heart sounds: No murmur heard. ?Pulmonary:  ?   Effort: Pulmonary effort is normal. No respiratory distress.  ?   Breath sounds: Normal breath sounds.  ?Abdominal:  ?   Palpations: Abdomen is soft. There is no hepatomegaly or mass.  ?   Tenderness: There is no abdominal tenderness.  ?Musculoskeletal:  ?   Cervical back: Spasms present. No edema, erythema or bony tenderness. No pain with movement, spinous process tenderness or muscular tenderness. Decreased range of motion (Extension and left rotation).  ?   Thoracic back: No tenderness or bony tenderness.  ?Lymphadenopathy:  ?   Cervical: No cervical adenopathy.  ?Skin: ?   General: Skin is warm.  ?   Findings: No erythema or rash.  ?Neurological:  ?   Mental Status: He is alert and oriented to person, place, and time.  ?   Cranial Nerves: No cranial nerve deficit.  ?   Deep Tendon Reflexes:  ?   Reflex Scores: ?     Bicep reflexes are 2+ on the right side and 2+ on the left side. ?     Patellar reflexes are 2+ on the right side and 2+ on the left side. ?   Comments: Otherwise stable gait, not assisted.  ?Psychiatric:     ?   Mood and Affect: Mood is anxious.  ?   Comments: Well groomed, good eye contact.  ? ?ASSESSMENT AND PLAN: ? ?Mr.Cynthia was seen today for  follow-up. ? ?Diagnoses and all orders for this visit: ?Orders Placed This Encounter  ?Procedures  ? CT HEAD WO CONTRAST (5MM)  ? ?Cervicalgia ?This problem could explain some of his symptoms. ?No hx of trauma, so I do

## 2021-11-16 NOTE — Telephone Encounter (Signed)
Pt has been added to Dr. Doug Sou schedule for tomorrow ?

## 2021-11-16 NOTE — Telephone Encounter (Signed)
Patient was seen at ED yesterday and told to follow up with PCP as soon as possible. Patient states that it is urgent and declined appointment for Tuesday the 25th of April. I explained that patient needs a 30 minute appointment and offered to place him with another provider or send a message and patient wanted a message sent back to see if he could be placed in a fifteen minute spot. ? ? ? ?Please advise  ?

## 2021-11-17 ENCOUNTER — Ambulatory Visit (INDEPENDENT_AMBULATORY_CARE_PROVIDER_SITE_OTHER): Payer: PPO | Admitting: Family Medicine

## 2021-11-17 ENCOUNTER — Encounter: Payer: Self-pay | Admitting: Family Medicine

## 2021-11-17 VITALS — BP 128/74 | HR 59 | Resp 16 | Ht 70.0 in | Wt 184.0 lb

## 2021-11-17 DIAGNOSIS — M542 Cervicalgia: Secondary | ICD-10-CM

## 2021-11-17 DIAGNOSIS — R42 Dizziness and giddiness: Secondary | ICD-10-CM

## 2021-11-17 DIAGNOSIS — R519 Headache, unspecified: Secondary | ICD-10-CM

## 2021-11-17 NOTE — Patient Instructions (Signed)
A few things to remember from today's visit: ? ?Headache, unspecified headache type - Plan: CT HEAD WO CONTRAST (5MM) ? ?Cervicalgia ? ?Lightheadedness - Plan: CT HEAD WO CONTRAST (5MM) ? ?If you need refills please call your pharmacy. ?Do not use My Chart to request refills or for acute issues that need immediate attention. ?  ?Monitor for new symptoms. ?Continue adequate hydration. ?Local neck massage and range of motion exercises may help. ? ?Please be sure medication list is accurate. ?If a new problem present, please set up appointment sooner than planned today. ? ? ? ? ? ? ? ?

## 2021-11-18 ENCOUNTER — Ambulatory Visit (INDEPENDENT_AMBULATORY_CARE_PROVIDER_SITE_OTHER)
Admission: RE | Admit: 2021-11-18 | Discharge: 2021-11-18 | Disposition: A | Discharge status: Home / Self Care | Payer: PPO | Source: Ambulatory Visit | Attending: Family Medicine | Admitting: Family Medicine

## 2021-11-18 DIAGNOSIS — R42 Dizziness and giddiness: Secondary | ICD-10-CM

## 2021-11-18 DIAGNOSIS — R519 Headache, unspecified: Secondary | ICD-10-CM

## 2021-11-19 ENCOUNTER — Encounter: Payer: Self-pay | Admitting: Family Medicine

## 2021-11-20 NOTE — Telephone Encounter (Signed)
I already reviewed head CT and sent a message with results. ?I do not see an indication at this time for referral, re-evaluation in 1-2 weeks is appropriate to repeat examination, unless a new symptom presents. ?Please arrange follow up appt with PCP in 1-2 weeks. ?Thanks, ?BJ ?

## 2021-11-25 ENCOUNTER — Ambulatory Visit (INDEPENDENT_AMBULATORY_CARE_PROVIDER_SITE_OTHER): Payer: PPO | Admitting: Family Medicine

## 2021-11-25 ENCOUNTER — Encounter: Payer: Self-pay | Admitting: Family Medicine

## 2021-11-25 ENCOUNTER — Encounter: Payer: Self-pay | Admitting: Neurology

## 2021-11-25 VITALS — BP 120/60 | HR 60 | Temp 98.1°F | Ht 70.0 in | Wt 188.5 lb

## 2021-11-25 DIAGNOSIS — F419 Anxiety disorder, unspecified: Secondary | ICD-10-CM | POA: Diagnosis not present

## 2021-11-25 DIAGNOSIS — R21 Rash and other nonspecific skin eruption: Secondary | ICD-10-CM

## 2021-11-25 DIAGNOSIS — R42 Dizziness and giddiness: Secondary | ICD-10-CM

## 2021-11-25 MED ORDER — ESCITALOPRAM OXALATE 5 MG PO TABS: 5.0000 mg | ORAL_TABLET | Freq: Every day | ORAL | 5 refills | Status: DC

## 2021-11-25 MED ORDER — CICLOPIROX OLAMINE 0.77 % EX CREA: TOPICAL_CREAM | Freq: Two times a day (BID) | CUTANEOUS | 1 refills | Status: AC

## 2021-11-25 NOTE — Progress Notes (Signed)
? ?Established Patient Office Visit ? ?Subjective   ?Patient ID: Eric Brock, male    DOB: 27-Jun-1941  Age: 81 y.o. MRN: [id number] ? ?Chief Complaint  ?Patient presents with  ? Results  ? ? ?HPI ? ? ?Patient is seen following recent ER visit and visit here for transient dizziness.  He states this occurred on 16 April.  He was driving home from church.  While driving he had odd sensation which extended from his occipital head and neck region and spread anterior.  He had some difficulty describing.  He states sensation lasted about 10 seconds.  There was some lightheadedness.  There is no confusion.  He pulled over and stop the vehicle.  Denied any associated pain.  Was able to continue driving afterwards.  Denied any speech change, visual changes, confusion, or any focal weakness.  He states that this felt like somewhat of a "out of body experience ".  He states he felt as if he were dying.  Went to the ER for further evaluation.  Blood pressure there 176/88.  He had lab work including CBC and chemistries and troponin which were negative.  Chest x-ray and EKG unremarkable.  No imaging done. ? ?He came to our clinic and saw another provider 2 days later and CT head showed no acute findings.  He had evidence for mild cerebral atrophy and chronic small vessel disease.  Some nonspecific bilateral ethmoid and maxillary sinus mucosal thickening. ? ?Patient has remote history of lymphoma.  Remote history of subdural hematoma.  No recent head trauma. ? ?He has felt somewhat more anxious recently according to wife.  She wonders whether this may be related to anxiety episode.  He also states he has felt somewhat more "wobbly "when walking but no focal weakness. ? ?Other issue is some persistent rash right groin region.  Scaly and erythematous.  Nonpruritic currently.  We tried Loprox but he is not using this consistently. ? ?Past Medical History:  ?Diagnosis Date  ? Allergic rhinitis   ? Arthritis   ? Bronchiectasis  pulmologist-  dr Lamonte Sakai ([place])--  per lov note 03-22-2016 stable  ? intermittant--  per pt last bout yr ago 2016   ? Eczema   ? Elevated PSA   ? History of colon polyps   ? 1999  ? History of concussion   ? 2005 and 2010 with mild cerebral bleed resolved -no residual  ? History of non-Hodgkin's lymphoma oncologist-  dr Benay Spice--  pt in remission  ? dx 03/ 2000  ,  Stage IA,  scalp/forehead ,  post chemo/radioation therapy  ? Hyperlipidemia   ? diet controlled, no med  ? Lower urinary tract symptoms (LUTS)   ? Prostate cancer [facility]) urologist-  dr Alyson Ingles  oncologist-  dr Alen Blew and dr Tammi Klippel  ? cT2b N0 M0, Gleason 4+3,  PSA 4.2,  vol 35.0cc--  plan IMRT w/ gold seeds and ADT  ? Wears glasses   ? ?Past Surgical History:  ?Procedure Laterality Date  ? CATARACT EXTRACTION W/ INTRAOCULAR LENS  IMPLANT, BILATERAL  2013  ? CHOLECYSTECTOMY  1994  ? COLONOSCOPY  last one 07-14-2015  ? GOLD SEED IMPLANT N/A 04/16/2016  ? Procedure: GOLD SEED IMPLANT;  Surgeon: Cleon Gustin, MD;  Location: [facility];  Service: Urology;  Laterality: N/A;  ? PROSTATE BIOPSY N/A 01/12/2016  ? Procedure: BIOPSY TRANSRECTAL ULTRASONIC PROSTATE (TUBP);  Surgeon: Cleon Gustin, MD;  Location: [facility];  Service: Urology;  Laterality: N/A;  ? [place]  ? ? reports that he quit smoking about 46 years ago. His smoking use included cigarettes. He started smoking about 61 years ago. He has a 12.00 pack-year smoking history. He has never used smokeless tobacco. He reports current alcohol use of about 7.0 standard drinks per week. He reports that he does not use drugs. ?family history includes Brain cancer in his mother; Cancer in his brother; Heart disease (age of onset: 78) in his father. ?Allergies  ?Allergen Reactions  ? Pollen Extract-Tree Extract [Pollen Extract]   ?  Weed allergies also  ? ? ?Review of Systems  ?Constitutional:  Negative for weight loss.  ?Eyes:  Negative for  blurred vision and double vision.  ?Respiratory:  Negative for cough.   ?Cardiovascular:  Negative for chest pain.  ?Neurological:  Positive for dizziness. Negative for tremors, speech change, focal weakness, seizures, loss of consciousness, weakness and headaches.  ?     See HPI  ? ?  ?Objective:  ?  ? ?BP 120/60 (BP Location: Left Arm, Patient Position: Sitting, Cuff Size: Normal)   Pulse 60   Temp 98.1 ?F (36.7 ?C) (Oral)   Ht '5\' 10"'$  (1.778 m)   Wt 188 lb 8 oz (85.5 kg)   SpO2 97%   BMI 27.05 kg/m?  ? ? ?Physical Exam ?Constitutional:   ?   General: He is not in acute distress. ?   Appearance: Normal appearance. He is well-developed. He is not ill-appearing.  ?HENT:  ?   Right Ear: External ear normal.  ?   Left Ear: External ear normal.  ?Eyes:  ?   Pupils: Pupils are equal, round, and reactive to light.  ?Neck:  ?   Thyroid: No thyromegaly.  ?Cardiovascular:  ?   Rate and Rhythm: Normal rate and regular rhythm.  ?Pulmonary:  ?   Effort: Pulmonary effort is normal. No respiratory distress.  ?   Breath sounds: Normal breath sounds. No wheezing or rales.  ?Musculoskeletal:  ?   Cervical back: Neck supple.  ?Neurological:  ?   General: No focal deficit present.  ?   Mental Status: He is alert and oriented to person, place, and time.  ?   Cranial Nerves: No cranial nerve deficit.  ?   Motor: No weakness.  ?   Coordination: Coordination normal.  ?   Gait: Gait normal.  ?Psychiatric:     ?   Mood and Affect: Mood normal.     ?   Thought Content: Thought content normal.  ? ? ? ?No results found for any visits on 11/25/21. ? ? ? ?The 10-year ASCVD risk score (Arnett DK, et al., 2019) is: 27.3% ? ?  ?Assessment & Plan:  ? ?#1 episode of transient dizziness.  Symptoms only lasted about 10 seconds but patient was extremely anxious during this episode.  Did not have any symptoms to suggest seizure or likely TIA or CVA.  Etiology unclear.  Nonfocal exam this time.  Has had significant underlying anxiety and not clear  how much role that may have had.  We did discuss possible cervicogenic source but he does not recall any recent severe sharp neck pain. ? ?-Patient would like to see neurologist to discuss further and we will go ahead and set up. ?-Follow-up promptly for any recurrent episodes ? ?#2 General anxiety symptoms.  He has had increased anxiety symptoms really for months.  This does not seem to be situational.  Recent  TSH normal.  We discussed starting low-dose Lexapro 5 mg daily and reassess 3 to 4 weeks ? ?#3 right groin rash.  Suspect tenia.  Recommend regular twice daily use of Loprox consistently for 2 to 3 weeks.  Be in touch if not resolving with that. ? ? ?Return in about 4 weeks (around 12/23/2021).  ? ? ?Carolann Littler, MD ? ?

## 2021-11-25 NOTE — Patient Instructions (Signed)
I will be setting up neurology referral.   ?

## 2021-11-30 DIAGNOSIS — J3081 Allergic rhinitis due to animal (cat) (dog) hair and dander: Secondary | ICD-10-CM | POA: Diagnosis not present

## 2021-11-30 DIAGNOSIS — J301 Allergic rhinitis due to pollen: Secondary | ICD-10-CM | POA: Diagnosis not present

## 2021-11-30 DIAGNOSIS — J3089 Other allergic rhinitis: Secondary | ICD-10-CM | POA: Diagnosis not present

## 2021-12-03 ENCOUNTER — Encounter: Payer: Self-pay | Admitting: Family Medicine

## 2021-12-03 DIAGNOSIS — R42 Dizziness and giddiness: Secondary | ICD-10-CM

## 2021-12-03 DIAGNOSIS — R531 Weakness: Secondary | ICD-10-CM

## 2021-12-06 NOTE — Progress Notes (Signed)
I have ordered MRI of brain to further assess and will go from there.   Don't know we can do much to get in sooner other than go to provider in another city.   ?

## 2021-12-08 NOTE — Addendum Note (Signed)
Addended by: Eulas Post on: 12/08/2021 09:06 AM ? ? Modules accepted: Orders ? ?

## 2021-12-11 DIAGNOSIS — J3081 Allergic rhinitis due to animal (cat) (dog) hair and dander: Secondary | ICD-10-CM | POA: Diagnosis not present

## 2021-12-11 DIAGNOSIS — J3089 Other allergic rhinitis: Secondary | ICD-10-CM | POA: Diagnosis not present

## 2021-12-11 DIAGNOSIS — J301 Allergic rhinitis due to pollen: Secondary | ICD-10-CM | POA: Diagnosis not present

## 2021-12-15 ENCOUNTER — Ambulatory Visit
Admission: RE | Admit: 2021-12-15 | Discharge: 2021-12-15 | Disposition: A | Discharge status: Home / Self Care | Payer: PPO | Source: Ambulatory Visit | Attending: Family Medicine | Admitting: Family Medicine

## 2021-12-15 DIAGNOSIS — R42 Dizziness and giddiness: Secondary | ICD-10-CM

## 2021-12-16 ENCOUNTER — Ambulatory Visit (INDEPENDENT_AMBULATORY_CARE_PROVIDER_SITE_OTHER): Payer: PPO | Admitting: Family Medicine

## 2021-12-16 ENCOUNTER — Other Ambulatory Visit: Payer: PPO

## 2021-12-16 VITALS — BP 134/60 | HR 59 | Temp 97.7°F | Ht 70.0 in | Wt 186.6 lb

## 2021-12-16 DIAGNOSIS — R2990 Unspecified symptoms and signs involving the nervous system: Secondary | ICD-10-CM | POA: Diagnosis not present

## 2021-12-16 DIAGNOSIS — F419 Anxiety disorder, unspecified: Secondary | ICD-10-CM | POA: Diagnosis not present

## 2021-12-16 DIAGNOSIS — R0981 Nasal congestion: Secondary | ICD-10-CM

## 2021-12-16 DIAGNOSIS — J329 Chronic sinusitis, unspecified: Secondary | ICD-10-CM

## 2021-12-16 MED ORDER — ESCITALOPRAM OXALATE 10 MG PO TABS: 10.0000 mg | ORAL_TABLET | Freq: Every day | ORAL | 3 refills | Status: DC

## 2021-12-16 MED ORDER — AMOXICILLIN-POT CLAVULANATE 875-125 MG PO TABS: 1.0000 | ORAL_TABLET | Freq: Two times a day (BID) | ORAL | 0 refills | Status: DC

## 2021-12-16 NOTE — Progress Notes (Signed)
? ?Established Patient Office Visit ? ?Subjective   ?Patient ID: Eric Brock, male    DOB: [birth year]  Age: 81 y.o. MRN: [id number] ? ?Chief Complaint  ?Patient presents with  ? Follow-up  ? ? ?HPI ? ? ?Here for follow-up regarding recent episode of transient dizziness.  Refer to prior note for details.  He was driving home from church on April 16 when he had very odd sensation which she had difficulty describing which extended from his occipital head and neck into the head anteriorly.  Symptoms only lasted about 10 seconds.  He had some lightheadedness.  No confusion.  No speech change.  Was able to keep driving.  No focal weakness.  No visual changes.  He states this was somewhat of a "out of body "experience.  Symptoms are very intense.  ER evaluation revealed elevated blood pressure.  No central nervous system imaging done.  Chest x-ray and EKG unremarkable.  Labs unremarkable.  Had follow-up here with another provider couple days later and CAT scan ordered which showed no acute findings.  He has some chronic small vessel disease.  Comment made of bilateral ethmoid and maxillary mucosal thickening. ? ?Patient continued to have increased anxiety symptoms.  We ended up starting low-dose Lexapro 5 mg daily.  He thinks this has helped some and he would like to consider further titration.  We obtained MRI scan which showed no acute intracranial pathology.  He had some mild parenchymal volume loss and chronic white matter microangiopathy.  Comment again made of extensive mucosal thickening ethmoid and maxillary sinuses.  Denies any headache at this time.  He does have some frequent nasal congestion symptoms.  No facial pain.  No purulent discharge.  We had ordered carotid Dopplers which are pending and he will get Monday. ? ?He continues to feel out of sorts at times and wonders if this is anxiety related.  No chest pain.  No new neurologic symptoms. ?Patient with fleeting paresthesias lower extremities. ? ?Past  Medical History:  ?Diagnosis Date  ? Allergic rhinitis   ? Arthritis   ? Bronchiectasis pulmologist-  dr Lamonte Sakai ([place])--  per lov note 03-22-2016 stable  ? intermittant--  per pt last bout yr ago 2016   ? Eczema   ? Elevated PSA   ? History of colon polyps   ? 1999  ? History of concussion   ? 2005 and 2010 with mild cerebral bleed resolved -no residual  ? History of non-Hodgkin's lymphoma oncologist-  dr Benay Spice--  pt in remission  ? dx 03/ 2000  ,  Stage IA,  scalp/forehead ,  post chemo/radioation therapy  ? Hyperlipidemia   ? diet controlled, no med  ? Lower urinary tract symptoms (LUTS)   ? Prostate cancer [facility]) urologist-  dr Alyson Ingles  oncologist-  dr Alen Blew and dr Tammi Klippel  ? cT2b N0 M0, Gleason 4+3,  PSA 4.2,  vol 35.0cc--  plan IMRT w/ gold seeds and ADT  ? Wears glasses   ? ?Past Surgical History:  ?Procedure Laterality Date  ? CATARACT EXTRACTION W/ INTRAOCULAR LENS  IMPLANT, BILATERAL  2013  ? CHOLECYSTECTOMY  1994  ? COLONOSCOPY  last one 07-14-2015  ? GOLD SEED IMPLANT N/A 04/16/2016  ? Procedure: GOLD SEED IMPLANT;  Surgeon: Cleon Gustin, MD;  [facility];  Service: Urology;  Laterality: N/A;  ? PROSTATE BIOPSY N/A 01/12/2016  ? Procedure: BIOPSY TRANSRECTAL ULTRASONIC PROSTATE (TUBP);  Surgeon: Cleon Gustin, MD;  Location: [facility];  Service: Urology;  Laterality: N/A;  ? [place]  ? ? reports that he quit smoking about 46 years ago. His smoking use included cigarettes. He started smoking about 61 years ago. He has a 12.00 pack-year smoking history. He has never used smokeless tobacco. He reports current alcohol use of about 7.0 standard drinks per week. He reports that he does not use drugs. ?family history includes Brain cancer in his mother; Cancer in his brother; Heart disease (age of onset: 60) in his father. ?Allergies  ?Allergen Reactions  ? Pollen Extract-Tree Extract [Pollen Extract]   ?  Weed allergies also   ? ? ?Review of Systems  ?Constitutional:  Negative for chills and fever.  ?Respiratory:  Negative for shortness of breath.   ?Cardiovascular:  Negative for chest pain.  ?Neurological:  Negative for weakness and headaches.  ? ?  ?Objective:  ?  ? ?BP 134/60 (BP Location: Left Arm, Patient Position: Sitting, Cuff Size: Normal)   Pulse (!) 59   Temp 97.7 ?F (36.5 ?C) (Oral)   Ht '5\' 10"'$  (1.778 m)   Wt 186 lb 9.6 oz (84.6 kg)   SpO2 98%   BMI 26.77 kg/m?  ? ? ?Physical Exam ?Vitals reviewed.  ?Constitutional:   ?   Appearance: Normal appearance.  ?HENT:  ?   Right Ear: Tympanic membrane normal.  ?   Left Ear: Tympanic membrane normal.  ?   Nose: Nose normal. No congestion or rhinorrhea.  ?Cardiovascular:  ?   Rate and Rhythm: Normal rate and regular rhythm.  ?Pulmonary:  ?   Effort: Pulmonary effort is normal.  ?   Breath sounds: Normal breath sounds.  ?Neurological:  ?   General: No focal deficit present.  ?   Mental Status: He is alert and oriented to person, place, and time.  ?   Cranial Nerves: No cranial nerve deficit.  ?   Motor: No weakness.  ?Psychiatric:     ?   Mood and Affect: Mood normal.  ? ? ? ?No results found for any visits on 12/16/21. ? ? ? ?The 10-year ASCVD risk score (Arnett DK, et al., 2019) is: 32.2% ? ?  ?Assessment & Plan:  ? ?#1 recent vague symptoms of lightheadedness, transient dizziness.  MRI shows no acute intracranial pathology.  Does have evidence for sinus disease as below.  He has pending follow-up with neurologist but this is not until September.  Has carotid Doppler pending for Monday but symptoms did not sound like typical TIA symptoms. ? ?#2 acute versus chronic sinus disease ethmoid and maxillary sinuses.  He does have some chronic nasal congestion.  Does not have any acute findings such as fever or any purulent secretions.  We did discuss possible empiric treatment with Augmentin 875 mg twice daily for 10 days ? ?#3 anxiety symptoms.  Some improved with low-dose Lexapro 5 mg  daily.  Titrate to 10 mg daily.  Reassess in 1 month. ? ?Return in about 1 month (around 01/16/2022).  ? ? ?Carolann Littler, MD ? ?

## 2021-12-18 DIAGNOSIS — J3089 Other allergic rhinitis: Secondary | ICD-10-CM | POA: Diagnosis not present

## 2021-12-18 DIAGNOSIS — J301 Allergic rhinitis due to pollen: Secondary | ICD-10-CM | POA: Diagnosis not present

## 2021-12-18 DIAGNOSIS — J3081 Allergic rhinitis due to animal (cat) (dog) hair and dander: Secondary | ICD-10-CM | POA: Diagnosis not present

## 2021-12-21 ENCOUNTER — Ambulatory Visit
Admission: RE | Admit: 2021-12-21 | Discharge: 2021-12-21 | Disposition: A | Discharge status: Home / Self Care | Payer: PPO | Source: Ambulatory Visit | Attending: Family Medicine | Admitting: Family Medicine

## 2021-12-21 DIAGNOSIS — R42 Dizziness and giddiness: Secondary | ICD-10-CM | POA: Diagnosis not present

## 2021-12-21 DIAGNOSIS — I771 Stricture of artery: Secondary | ICD-10-CM | POA: Diagnosis not present

## 2021-12-21 DIAGNOSIS — I6523 Occlusion and stenosis of bilateral carotid arteries: Secondary | ICD-10-CM | POA: Diagnosis not present

## 2021-12-22 ENCOUNTER — Other Ambulatory Visit: Payer: PPO

## 2021-12-24 DIAGNOSIS — J3081 Allergic rhinitis due to animal (cat) (dog) hair and dander: Secondary | ICD-10-CM | POA: Diagnosis not present

## 2021-12-24 DIAGNOSIS — J301 Allergic rhinitis due to pollen: Secondary | ICD-10-CM | POA: Diagnosis not present

## 2021-12-24 DIAGNOSIS — J3089 Other allergic rhinitis: Secondary | ICD-10-CM | POA: Diagnosis not present

## 2021-12-26 DIAGNOSIS — H9221 Otorrhagia, right ear: Secondary | ICD-10-CM | POA: Diagnosis not present

## 2021-12-31 ENCOUNTER — Other Ambulatory Visit: Payer: Self-pay | Admitting: Family Medicine

## 2021-12-31 ENCOUNTER — Encounter: Payer: Self-pay | Admitting: Family Medicine

## 2022-01-01 DIAGNOSIS — J3089 Other allergic rhinitis: Secondary | ICD-10-CM | POA: Diagnosis not present

## 2022-01-01 DIAGNOSIS — J301 Allergic rhinitis due to pollen: Secondary | ICD-10-CM | POA: Diagnosis not present

## 2022-01-01 DIAGNOSIS — J3081 Allergic rhinitis due to animal (cat) (dog) hair and dander: Secondary | ICD-10-CM | POA: Diagnosis not present

## 2022-01-01 MED ORDER — AMOXICILLIN-POT CLAVULANATE 875-125 MG PO TABS: 1.0000 | ORAL_TABLET | Freq: Two times a day (BID) | ORAL | 0 refills | Status: DC

## 2022-01-07 DIAGNOSIS — J3089 Other allergic rhinitis: Secondary | ICD-10-CM | POA: Diagnosis not present

## 2022-01-07 DIAGNOSIS — J301 Allergic rhinitis due to pollen: Secondary | ICD-10-CM | POA: Diagnosis not present

## 2022-01-07 DIAGNOSIS — J3081 Allergic rhinitis due to animal (cat) (dog) hair and dander: Secondary | ICD-10-CM | POA: Diagnosis not present

## 2022-01-12 DIAGNOSIS — J3081 Allergic rhinitis due to animal (cat) (dog) hair and dander: Secondary | ICD-10-CM | POA: Diagnosis not present

## 2022-01-12 DIAGNOSIS — J301 Allergic rhinitis due to pollen: Secondary | ICD-10-CM | POA: Diagnosis not present

## 2022-01-12 DIAGNOSIS — J3089 Other allergic rhinitis: Secondary | ICD-10-CM | POA: Diagnosis not present

## 2022-01-13 ENCOUNTER — Ambulatory Visit (INDEPENDENT_AMBULATORY_CARE_PROVIDER_SITE_OTHER): Payer: PPO | Admitting: Family Medicine

## 2022-01-13 ENCOUNTER — Encounter: Payer: Self-pay | Admitting: Family Medicine

## 2022-01-13 VITALS — BP 110/60 | HR 55 | Temp 97.7°F | Ht 70.0 in | Wt 186.5 lb

## 2022-01-13 DIAGNOSIS — I700 Atherosclerosis of aorta: Secondary | ICD-10-CM

## 2022-01-13 DIAGNOSIS — F419 Anxiety disorder, unspecified: Secondary | ICD-10-CM | POA: Diagnosis not present

## 2022-01-13 DIAGNOSIS — E785 Hyperlipidemia, unspecified: Secondary | ICD-10-CM

## 2022-01-13 MED ORDER — ROSUVASTATIN CALCIUM 10 MG PO TABS: 10.0000 mg | ORAL_TABLET | Freq: Every day | ORAL | 3 refills | Status: DC

## 2022-01-13 NOTE — Patient Instructions (Signed)
Set up 6-8 week labs to repeat lipids and liver panel.

## 2022-01-13 NOTE — Progress Notes (Signed)
Established Patient Office Visit  Subjective   Patient ID: Eric Brock, male    DOB: February 22, 1941  Age: 81 y.o. MRN: [id number]  Chief Complaint  Patient presents with   Follow-up    HPI   Here for follow-up regarding recent visit.  He had some anxiety symptoms and we have started Lexapro 5 mg daily.  He felt he may have seen some mild benefit with 5 but we titrated to 10 months ago and he does feel like his anxiousness has improved.  Still has some anxiety symptoms but coping much better.  He had some sinusitis findings on recent imaging and we ended up starting Augmentin.  He has had some decreased sinus pressure but still has some nasal congestion intermittently.  Remains on Mucinex and takes allergy medications regularly.  No fever.  No headache.  No facial pain.  Patient has history of mild hyperlipidemia.  Recent LDL cholesterol 131.  He had carotid Dopplers which showed 50 to 69% range stenosis.  He also has history of coronary calcium on CT chest with mention of aortic atherosclerosis.  Currently not on statin.  No prior history of statin intolerance.  Past Medical History:  Diagnosis Date   Allergic rhinitis    Arthritis    Bronchiectasis pulmologist-  dr Lamonte Sakai ([place])--  per lov note 03-22-2016 stable   intermittant--  per pt last bout yr ago 2016    Eczema    Elevated PSA    History of colon polyps    1999   History of concussion    2005 and 2010 with mild cerebral bleed resolved -no residual   History of non-Hodgkin's lymphoma oncologist-  dr Benay Spice--  pt in remission   dx 03/ 2000  ,  Stage IA,  scalp/forehead ,  post chemo/radioation therapy   Hyperlipidemia    diet controlled, no med   Lower urinary tract symptoms (LUTS)    Prostate cancer [facility]) urologist-  dr Alyson Ingles  oncologist-  dr Alen Blew and dr manning   cT2b N0 M0, Gleason 4+3,  PSA 4.2,  vol 35.0cc--  plan IMRT w/ gold seeds and ADT   Wears glasses    Past Surgical History:  Procedure  Laterality Date   CATARACT EXTRACTION W/ INTRAOCULAR LENS  IMPLANT, BILATERAL  2013   CHOLECYSTECTOMY  1994   COLONOSCOPY  last one 07-14-2015   GOLD SEED IMPLANT N/A 04/16/2016   Procedure: GOLD SEED IMPLANT;  Surgeon: Cleon Gustin, MD;  Location: [facility];  Service: Urology;  Laterality: N/A;   PROSTATE BIOPSY N/A 01/12/2016   Procedure: BIOPSY TRANSRECTAL ULTRASONIC PROSTATE (TUBP);  Surgeon: Cleon Gustin, MD;  Location: [facility];  Service: Urology;  Laterality: N/A;   Schlusser    reports that he quit smoking about 46 years ago. His smoking use included cigarettes. He started smoking about 61 years ago. He has a 12.00 pack-year smoking history. He has never used smokeless tobacco. He reports current alcohol use of about 7.0 standard drinks of alcohol per week. He reports that he does not use drugs. family history includes Brain cancer in his mother; Cancer in his brother; Heart disease (age of onset: 34) in his father. Allergies  Allergen Reactions   Pollen Extract-Tree Extract [Pollen Extract]     Weed allergies also    Review of Systems  Constitutional:  Negative for malaise/fatigue.  Eyes:  Negative for blurred vision.  Respiratory:  Negative for shortness of  breath.   Cardiovascular:  Negative for chest pain.  Neurological:  Negative for dizziness, weakness and headaches.      Objective:     BP 110/60 (BP Location: Left Arm, Patient Position: Sitting, Cuff Size: Normal)   Pulse (!) 55   Temp 97.7 F (36.5 C) (Oral)   Ht '5\' 10"'$  (1.778 m)   Wt 186 lb 8 oz (84.6 kg)   SpO2 98%   BMI 26.76 kg/m    Physical Exam Constitutional:      Appearance: He is well-developed.  HENT:     Right Ear: External ear normal.     Left Ear: External ear normal.  Eyes:     Pupils: Pupils are equal, round, and reactive to light.  Neck:     Thyroid: No thyromegaly.  Cardiovascular:     Rate and Rhythm: Normal rate and  regular rhythm.  Pulmonary:     Effort: Pulmonary effort is normal. No respiratory distress.     Breath sounds: Normal breath sounds. No wheezing or rales.  Musculoskeletal:     Cervical back: Neck supple.  Neurological:     Mental Status: He is alert and oriented to person, place, and time.      No results found for any visits on 01/13/22.    The 10-year ASCVD risk score (Arnett DK, et al., 2019) is: 24%    Assessment & Plan:   #1 anxiety symptoms improved on Lexapro 10 mg daily.  Also discussed nonpharmacologic management.  We discussed possible counseling but he declines at this time.  Increase exercise  #2 history of aortic atherosclerosis by prior imaging.  Also has history of coronary calcification by previous CT chest.  Recent Doppler findings as above with 50 to 69% ICA stenosis. -We recommend statin therapy.  He was initially reluctant for fear of myalgias.  We will start low with Crestor 10 mg daily and if tolerating well plan repeat lipid and hepatic panel about 6 to 8 weeks.  Consider further titration then depending on results   No follow-ups on file.    Carolann Littler, MD

## 2022-01-21 DIAGNOSIS — J3081 Allergic rhinitis due to animal (cat) (dog) hair and dander: Secondary | ICD-10-CM | POA: Diagnosis not present

## 2022-01-21 DIAGNOSIS — J3089 Other allergic rhinitis: Secondary | ICD-10-CM | POA: Diagnosis not present

## 2022-01-21 DIAGNOSIS — J301 Allergic rhinitis due to pollen: Secondary | ICD-10-CM | POA: Diagnosis not present

## 2022-01-25 DIAGNOSIS — J301 Allergic rhinitis due to pollen: Secondary | ICD-10-CM | POA: Diagnosis not present

## 2022-01-25 DIAGNOSIS — J3081 Allergic rhinitis due to animal (cat) (dog) hair and dander: Secondary | ICD-10-CM | POA: Diagnosis not present

## 2022-01-25 DIAGNOSIS — J3089 Other allergic rhinitis: Secondary | ICD-10-CM | POA: Diagnosis not present

## 2022-02-08 DIAGNOSIS — J3089 Other allergic rhinitis: Secondary | ICD-10-CM | POA: Diagnosis not present

## 2022-02-08 DIAGNOSIS — J301 Allergic rhinitis due to pollen: Secondary | ICD-10-CM | POA: Diagnosis not present

## 2022-02-08 DIAGNOSIS — J3081 Allergic rhinitis due to animal (cat) (dog) hair and dander: Secondary | ICD-10-CM | POA: Diagnosis not present

## 2022-02-18 DIAGNOSIS — J3089 Other allergic rhinitis: Secondary | ICD-10-CM | POA: Diagnosis not present

## 2022-02-18 DIAGNOSIS — J301 Allergic rhinitis due to pollen: Secondary | ICD-10-CM | POA: Diagnosis not present

## 2022-02-18 DIAGNOSIS — J3081 Allergic rhinitis due to animal (cat) (dog) hair and dander: Secondary | ICD-10-CM | POA: Diagnosis not present

## 2022-02-23 DIAGNOSIS — J3089 Other allergic rhinitis: Secondary | ICD-10-CM | POA: Diagnosis not present

## 2022-02-23 DIAGNOSIS — J301 Allergic rhinitis due to pollen: Secondary | ICD-10-CM | POA: Diagnosis not present

## 2022-02-23 DIAGNOSIS — J3081 Allergic rhinitis due to animal (cat) (dog) hair and dander: Secondary | ICD-10-CM | POA: Diagnosis not present

## 2022-02-24 ENCOUNTER — Other Ambulatory Visit (INDEPENDENT_AMBULATORY_CARE_PROVIDER_SITE_OTHER): Payer: PPO

## 2022-02-24 DIAGNOSIS — E785 Hyperlipidemia, unspecified: Secondary | ICD-10-CM

## 2022-02-24 LAB — LIPID PANEL
Cholesterol: 121 mg/dL (ref 0–200)
HDL: 43 mg/dL (ref >39.00)
LDL Cholesterol: 62 mg/dL (ref 0–99)
NonHDL: 77.82
Total CHOL/HDL Ratio: 3
Triglycerides: 78 mg/dL (ref 0.0–149.0)
VLDL: 15.6 mg/dL (ref 0.0–40.0)

## 2022-02-24 LAB — HEPATIC FUNCTION PANEL
ALT: 33 U/L (ref 0–53)
AST: 22 U/L (ref 0–37)
Albumin: 4.2 g/dL (ref 3.5–5.2)
Alkaline Phosphatase: 55 U/L (ref 39–117)
Bilirubin, Direct: 0.1 mg/dL (ref 0.0–0.3)
Total Bilirubin: 0.6 mg/dL (ref 0.2–1.2)
Total Protein: 6.7 g/dL (ref 6.0–8.3)

## 2022-03-02 ENCOUNTER — Ambulatory Visit (INDEPENDENT_AMBULATORY_CARE_PROVIDER_SITE_OTHER): Payer: PPO | Admitting: Family Medicine

## 2022-03-02 ENCOUNTER — Encounter: Payer: Self-pay | Admitting: Family Medicine

## 2022-03-02 VITALS — BP 110/60 | HR 55 | Temp 97.9°F | Ht 70.0 in | Wt 191.4 lb

## 2022-03-02 DIAGNOSIS — R059 Cough, unspecified: Secondary | ICD-10-CM

## 2022-03-02 DIAGNOSIS — J479 Bronchiectasis, uncomplicated: Secondary | ICD-10-CM | POA: Diagnosis not present

## 2022-03-02 DIAGNOSIS — J329 Chronic sinusitis, unspecified: Secondary | ICD-10-CM | POA: Diagnosis not present

## 2022-03-02 LAB — POCT INFLUENZA A/B
Influenza A, POC: NEGATIVE
Influenza B, POC: NEGATIVE

## 2022-03-02 LAB — POC COVID19 BINAXNOW: SARS Coronavirus 2 Ag: NEGATIVE

## 2022-03-02 MED ORDER — DOXYCYCLINE HYCLATE 100 MG PO CAPS: 100.0000 mg | ORAL_CAPSULE | Freq: Two times a day (BID) | ORAL | 0 refills | Status: DC

## 2022-03-02 NOTE — Progress Notes (Signed)
Established Patient Office Visit  Subjective   Patient ID: Eric Brock, male    DOB: 24-Dec-1940  Age: 81 y.o. MRN: [id number]  Chief Complaint  Patient presents with   Cough    Patient complains of cough, x4 days, Productive cough with greenish sputum, Tried Advil with little relief   Sinusitis    Patient complains of sinusitis, x4 days, Tried Advil with little relief    Fatigue    Patient complains of fatigue, x6 days,    HPI   Seen with 1 week history of fatigue and some nasal congestion especially on the right side along with productive cough.  He is concerned because he has history of bronchiectasis.  He noticed some greenish nasal discharge Friday right naris and cough productive of green to yellow sputum past few days.  No dyspnea.  No fever.  Has been taking fairly regular Advil Cold and Sinus.  Some mild wheezing.  He has rescue inhaler at home but not using currently.  Has taken some over-the-counter Mucinex 1200 mg twice daily.  He had CT scan of the head few months ago that showed bilateral ethmoid and maxillary sinus thickening.  Past Medical History:  Diagnosis Date   Allergic rhinitis    Arthritis    Bronchiectasis pulmologist-  dr Lamonte Sakai ([place])--  per lov note 03-22-2016 stable   intermittant--  per pt last bout yr ago 2016    Eczema    Elevated PSA    History of colon polyps    1999   History of concussion    2005 and 2010 with mild cerebral bleed resolved -no residual   History of non-Hodgkin's lymphoma oncologist-  dr Benay Spice--  pt in remission   dx 03/ 2000  ,  Stage IA,  scalp/forehead ,  post chemo/radioation therapy   Hyperlipidemia    diet controlled, no med   Lower urinary tract symptoms (LUTS)    Prostate cancer [facility]) urologist-  dr Alyson Ingles  oncologist-  dr Alen Blew and dr manning   cT2b N0 M0, Gleason 4+3,  PSA 4.2,  vol 35.0cc--  plan IMRT w/ gold seeds and ADT   Wears glasses    Past Surgical History:  Procedure Laterality Date    CATARACT EXTRACTION W/ INTRAOCULAR LENS  IMPLANT, BILATERAL  2013   CHOLECYSTECTOMY  1994   COLONOSCOPY  last one 07-14-2015   GOLD SEED IMPLANT N/A 04/16/2016   Procedure: GOLD SEED IMPLANT;  Surgeon: Cleon Gustin, MD;  Location: [facility];  Service: Urology;  Laterality: N/A;   PROSTATE BIOPSY N/A 01/12/2016   Procedure: BIOPSY TRANSRECTAL ULTRASONIC PROSTATE (TUBP);  Surgeon: Cleon Gustin, MD;  Location: [facility];  Service: Urology;  Laterality: N/A;   Louisa    reports that he quit smoking about 46 years ago. His smoking use included cigarettes. He started smoking about 61 years ago. He has a 12.00 pack-year smoking history. He has never used smokeless tobacco. He reports current alcohol use of about 7.0 standard drinks of alcohol per week. He reports that he does not use drugs. family history includes Brain cancer in his mother; Cancer in his brother; Heart disease (age of onset: 88) in his father. Allergies  Allergen Reactions   Pollen Extract-Tree Extract [Pollen Extract]     Weed allergies also    Review of Systems  Constitutional:  Negative for chills and fever.  HENT:  Positive for congestion.   Respiratory:  Positive  for cough and sputum production. Negative for hemoptysis.   Cardiovascular:  Negative for chest pain.  Skin:  Negative for rash.      Objective:     BP 110/60 (BP Location: Left Arm, Patient Position: Sitting, Cuff Size: Normal)   Pulse (!) 55   Temp 97.9 F (36.6 C) (Oral)   Ht '5\' 10"'$  (1.778 m)   Wt 191 lb 6.4 oz (86.8 kg)   SpO2 95%   BMI 27.46 kg/m  BP Readings from Last 3 Encounters:  03/02/22 110/60  01/13/22 110/60  12/16/21 134/60   Wt Readings from Last 3 Encounters:  03/02/22 191 lb 6.4 oz (86.8 kg)  01/13/22 186 lb 8 oz (84.6 kg)  12/16/21 186 lb 9.6 oz (84.6 kg)      Physical Exam Vitals reviewed.  Constitutional:      Appearance: He is not ill-appearing.   Cardiovascular:     Rate and Rhythm: Normal rate and regular rhythm.  Pulmonary:     Comments: Has a few faint scattered wheezes.  No retractions.  No rales. Musculoskeletal:     Cervical back: Neck supple.     Right lower leg: No edema.     Left lower leg: No edema.  Lymphadenopathy:     Cervical: No cervical adenopathy.  Neurological:     Mental Status: He is alert.      Results for orders placed or performed in visit on 03/02/22  POC COVID-19  Result Value Ref Range   SARS Coronavirus 2 Ag Negative Negative  POC Influenza A/B  Result Value Ref Range   Influenza A, POC Negative Negative   Influenza B, POC Negative Negative      The ASCVD Risk score (Arnett DK, et al., 2019) failed to calculate for the following reasons:   The valid total cholesterol range is 130 to 320 mg/dL    Assessment & Plan:   Problem List Items Addressed This Visit       Unprioritized   BRONCHIECTASIS   Other Visit Diagnoses     Cough, unspecified type    -  Primary   Relevant Orders   POC COVID-19 (Completed)   POC Influenza A/B (Completed)     Patient relates 1 week history of productive cough along with right sided nasal congestion and purulent like secretions.  No fever.  He does have history of bronchiectasis which places him at high risk of respiratory complication.  -Continue Mucinex 1200 mg twice daily -Stay well-hydrated -Rescue inhaler with Ventolin as needed -We discussed possible short-term steroid use with his lung situation but he is reluctant because of risk of side effects -Start doxycycline 100 mg twice daily.  Cautioned about sun sensitivity. -Follow-up if not improving over the next week and sooner for any worsening symptoms  No follow-ups on file.    Carolann Littler, MD

## 2022-03-02 NOTE — Addendum Note (Signed)
Addended by: Eulas Post on: 03/02/2022 09:32 AM   Modules accepted: Level of Service

## 2022-03-03 DIAGNOSIS — J3081 Allergic rhinitis due to animal (cat) (dog) hair and dander: Secondary | ICD-10-CM | POA: Diagnosis not present

## 2022-03-03 DIAGNOSIS — J3089 Other allergic rhinitis: Secondary | ICD-10-CM | POA: Diagnosis not present

## 2022-03-03 DIAGNOSIS — J301 Allergic rhinitis due to pollen: Secondary | ICD-10-CM | POA: Diagnosis not present

## 2022-03-09 DIAGNOSIS — J3089 Other allergic rhinitis: Secondary | ICD-10-CM | POA: Diagnosis not present

## 2022-03-09 DIAGNOSIS — J301 Allergic rhinitis due to pollen: Secondary | ICD-10-CM | POA: Diagnosis not present

## 2022-03-09 DIAGNOSIS — J3081 Allergic rhinitis due to animal (cat) (dog) hair and dander: Secondary | ICD-10-CM | POA: Diagnosis not present

## 2022-03-18 DIAGNOSIS — J301 Allergic rhinitis due to pollen: Secondary | ICD-10-CM | POA: Diagnosis not present

## 2022-03-18 DIAGNOSIS — J3081 Allergic rhinitis due to animal (cat) (dog) hair and dander: Secondary | ICD-10-CM | POA: Diagnosis not present

## 2022-03-18 DIAGNOSIS — J3089 Other allergic rhinitis: Secondary | ICD-10-CM | POA: Diagnosis not present

## 2022-03-26 DIAGNOSIS — J301 Allergic rhinitis due to pollen: Secondary | ICD-10-CM | POA: Diagnosis not present

## 2022-03-26 DIAGNOSIS — J3089 Other allergic rhinitis: Secondary | ICD-10-CM | POA: Diagnosis not present

## 2022-03-26 DIAGNOSIS — J3081 Allergic rhinitis due to animal (cat) (dog) hair and dander: Secondary | ICD-10-CM | POA: Diagnosis not present

## 2022-04-02 DIAGNOSIS — J301 Allergic rhinitis due to pollen: Secondary | ICD-10-CM | POA: Diagnosis not present

## 2022-04-02 DIAGNOSIS — J3089 Other allergic rhinitis: Secondary | ICD-10-CM | POA: Diagnosis not present

## 2022-04-02 DIAGNOSIS — J3081 Allergic rhinitis due to animal (cat) (dog) hair and dander: Secondary | ICD-10-CM | POA: Diagnosis not present

## 2022-04-08 NOTE — Progress Notes (Unsigned)
NEUROLOGY CONSULTATION NOTE  Eric Brock MRN: [id number] DOB: Sep 13, 1940  Referring provider: Carolann Littler, MD Primary care provider: Carolann Littler, MD  Reason for consult:  dizziness  Assessment/Plan:   I think episode was likely related to underlying anxiety and head pressure related to sinusitis.  I don't believe it was a TIA or other primary neurologic etiology.  Neurologic exam is unremarkable.  Recommend continued management of underlying anxiety.  As symptoms improved, no further workup warranted.  He is encouraged to follow up for worsening or new symptoms.     Subjective:  Eric Brock is a 81 year old right-handed male with HLD, bronchiectasis and history of prostate cancer and non-Hodgkin's lymphoma who presents for dizziness.  History supplemented by ED and primary care notes.  CT and MRI of brain personally reviewed.  He is accompanied by his wife who also supplements history.   On 11/15/2021, he was driving home from church when he developed a strange sensation.  He felt what seemed like a "shade" traveling up the back of his head.  He also had a sensation of impending doom.  Symptoms lasted about 10 seconds.  No associated confusion, headache, speech disturbance, visual disturbance, or unilateral numbness or weakness.  He went to the ED for further evaluation.  Blood pressure noted to be 176/88 but labs, EKG and CXR were unremarkable. He felt "dry" and thirsty and needed to drink water.  He saw primary care a couple of days later.  CT head performed on 11/18/2021 showed bilateral ethmoid and maxillary sinus mucosal thickening but no acute intracranial abnormality.  He did experience various episodes of diffuse head pressure with congestion and was treated with antibiotics.  He reports increased anxiety that started around this time and was started on Lexapro but his symptoms persisted.  MRI of brain without contrast on 12/15/2021 again showed extensive bilateral ethmoid  and maxillary sinus mucosal thickening possibly reflecting sinusitis but no acute intracranial abnormality.  Carotid ultrasound on 12/21/2021 showed heterogenous plaque at the right carotid bifurcation possibly indicating 50-69% stenosis and no hemodynamically significant stenosis of the left ICA.  He has longstanding history of anxiety.  It has definitely improved and he no longer has the head pressure.  He has had a couple of episodes of vivid nightmares that causes him to thrash around.  One time he fell out of bed.  He has had these from time to time but never two within the same month.   Remote history of traumatic SDH and concussion in 2010.  PAST MEDICAL HISTORY: Past Medical History:  Diagnosis Date   Allergic rhinitis    Arthritis    Bronchiectasis pulmologist-  dr Lamonte Sakai ([place])--  per lov note 03-22-2016 stable   intermittant--  per pt last bout yr ago 2016    Eczema    Elevated PSA    History of colon polyps    1999   History of concussion    2005 and 2010 with mild cerebral bleed resolved -no residual   History of non-Hodgkin's lymphoma oncologist-  dr Benay Spice--  pt in remission   dx 03/ 2000  ,  Stage IA,  scalp/forehead ,  post chemo/radioation therapy   Hyperlipidemia    diet controlled, no med   Lower urinary tract symptoms (LUTS)    Prostate cancer [facility]) urologist-  dr Alyson Ingles  oncologist-  dr Alen Blew and dr Tammi Klippel   cT2b N0 M0, Gleason 4+3,  PSA 4.2,  vol 35.0cc--  plan IMRT  w/ gold seeds and ADT   Wears glasses     PAST SURGICAL HISTORY: Past Surgical History:  Procedure Laterality Date   CATARACT EXTRACTION W/ INTRAOCULAR LENS  IMPLANT, BILATERAL  2013   CHOLECYSTECTOMY  1994   COLONOSCOPY  last one 07-14-2015   GOLD SEED IMPLANT N/A 04/16/2016   Procedure: GOLD SEED IMPLANT;  Surgeon: Cleon Gustin, MD;  [facility];  Service: Urology;  Laterality: N/A;   PROSTATE BIOPSY N/A 01/12/2016   Procedure: BIOPSY TRANSRECTAL ULTRASONIC  PROSTATE (TUBP);  Surgeon: Cleon Gustin, MD;  Location[facility];  Service: Urology;  Laterality: N/A;   UMBILICAL HERNIA REPAIR  1992    MEDICATIONS: Current Outpatient Medications on File Prior to Visit  Medication Sig Dispense Refill   Acetaminophen (TYLENOL PO) Take by mouth.     albuterol (VENTOLIN HFA) 108 (90 Base) MCG/ACT inhaler Inhale 2 puffs into the lungs every 6 (six) hours as needed for wheezing or shortness of breath. 8 g 1   Avanafil 200 MG TABS Take 1 tablet by mouth once a week.     ciclopirox (LOPROX) 0.77 % cream Apply topically 2 (two) times daily. 15 g 1   Desoximetasone (TOPICORT) 0.25 % ointment Apply 1 application topically 2 (two) times daily. 30 g 2   doxycycline (VIBRAMYCIN) 100 MG capsule Take 1 capsule (100 mg total) by mouth 2 (two) times daily. 14 capsule 0   EPINEPHrine 0.3 mg/0.3 mL IJ SOAJ injection Inject into the muscle.     escitalopram (LEXAPRO) 10 MG tablet Take 1 tablet (10 mg total) by mouth daily. 90 tablet 3   fexofenadine (ALLEGRA) 180 MG tablet Take 180 mg by mouth every morning.     fluticasone (FLONASE) 50 MCG/ACT nasal spray SPRAY TWO SPRAYS IN EACH NOSTRIL DAILY AS NEEDED 16 g 2   GuaiFENesin (MUCINEX PO) Take 1,200 mg by mouth 2 (two) times daily. Extended release     ibuprofen (ADVIL,MOTRIN) 200 MG tablet Take 200 mg by mouth every 6 (six) hours as needed.     mometasone (ELOCON) 0.1 % cream Apply topically.     Multiple Vitamins-Minerals (CENTRUM ADULTS PO) Take 1 tablet by mouth daily.     Respiratory Therapy Supplies (FLUTTER) DEVI 1 Device by Does not apply route as needed. 1 each 0   rosuvastatin (CRESTOR) 10 MG tablet Take 1 tablet (10 mg total) by mouth daily. 90 tablet 3   sildenafil (VIAGRA) 100 MG tablet Take 0.5-1 tablets (50-100 mg total) by mouth daily as needed for erectile dysfunction. 6 tablet 11   No current facility-administered medications on file prior to visit.    ALLERGIES: Allergies   Allergen Reactions   Pollen Extract-Tree Extract [Pollen Extract]     Weed allergies also    FAMILY HISTORY: Family History  Problem Relation Age of Onset   Heart disease Father 34   Cancer Brother        prostate   Brain cancer Mother    Colon cancer Neg Hx    Colon polyps Neg Hx    Esophageal cancer Neg Hx    Rectal cancer Neg Hx    Stomach cancer Neg Hx     Objective:  Blood pressure (!) 141/71, pulse (!) 56, resp. rate 18, height '5\' 10"'$  (1.778 m), weight 190 lb (86.2 kg), SpO2 95 %. General: No acute distress.  Patient appears well-groomed.   Head:  Normocephalic/atraumatic Eyes:  fundi examined but not visualized Neck: supple,  no paraspinal tenderness, full range of motion Back: No paraspinal tenderness Heart: regular rate and rhythm Lungs: Clear to auscultation bilaterally. Vascular: No carotid bruits. Neurological Exam: Mental status: alert and oriented to person, place, and time, speech fluent and not dysarthric, language intact. Cranial nerves: CN I: not tested CN II: bilateral surgical pupils, visual fields intact CN III, IV, VI:  full range of motion, no nystagmus, no ptosis CN V: facial sensation intact. CN VII: upper and lower face symmetric CN VIII: hearing intact CN IX, X: gag intact, uvula midline CN XI: sternocleidomastoid and trapezius muscles intact CN XII: tongue midline Bulk & Tone: normal, no fasciculations, no rigidity. Motor:  muscle strength 5/5 throughout.  No bradykinesia or tremor Sensation:  Pinprick, temperature and vibratory sensation intact. Deep Tendon Reflexes:  2+ throughout,  toes downgoing.   Finger to nose testing:  Without dysmetria.   Heel to shin:  Without dysmetria.   Gait:  Slightly stopped posture but normal arm swing and stride.  Romberg negative.    Thank you for allowing me to take part in the care of this patient.  Metta Clines, DO  CC: Carolann Littler, MD

## 2022-04-09 ENCOUNTER — Ambulatory Visit: Payer: PPO

## 2022-04-12 ENCOUNTER — Encounter: Payer: Self-pay | Admitting: Neurology

## 2022-04-12 ENCOUNTER — Ambulatory Visit: Payer: PPO | Admitting: Neurology

## 2022-04-12 VITALS — BP 141/71 | HR 56 | Resp 18 | Ht 70.0 in | Wt 190.0 lb

## 2022-04-12 DIAGNOSIS — F419 Anxiety disorder, unspecified: Secondary | ICD-10-CM | POA: Diagnosis not present

## 2022-04-12 DIAGNOSIS — R42 Dizziness and giddiness: Secondary | ICD-10-CM | POA: Diagnosis not present

## 2022-04-12 DIAGNOSIS — J3089 Other allergic rhinitis: Secondary | ICD-10-CM | POA: Diagnosis not present

## 2022-04-12 DIAGNOSIS — J301 Allergic rhinitis due to pollen: Secondary | ICD-10-CM | POA: Diagnosis not present

## 2022-04-12 DIAGNOSIS — J3081 Allergic rhinitis due to animal (cat) (dog) hair and dander: Secondary | ICD-10-CM | POA: Diagnosis not present

## 2022-04-15 ENCOUNTER — Ambulatory Visit (INDEPENDENT_AMBULATORY_CARE_PROVIDER_SITE_OTHER): Payer: PPO

## 2022-04-15 DIAGNOSIS — Z23 Encounter for immunization: Secondary | ICD-10-CM

## 2022-04-16 DIAGNOSIS — J3089 Other allergic rhinitis: Secondary | ICD-10-CM | POA: Diagnosis not present

## 2022-04-16 DIAGNOSIS — J301 Allergic rhinitis due to pollen: Secondary | ICD-10-CM | POA: Diagnosis not present

## 2022-04-16 DIAGNOSIS — J3081 Allergic rhinitis due to animal (cat) (dog) hair and dander: Secondary | ICD-10-CM | POA: Diagnosis not present

## 2022-04-24 DIAGNOSIS — Z20822 Contact with and (suspected) exposure to covid-19: Secondary | ICD-10-CM | POA: Diagnosis not present

## 2022-04-24 DIAGNOSIS — M791 Myalgia, unspecified site: Secondary | ICD-10-CM | POA: Diagnosis not present

## 2022-04-24 DIAGNOSIS — J189 Pneumonia, unspecified organism: Secondary | ICD-10-CM | POA: Diagnosis not present

## 2022-04-26 ENCOUNTER — Telehealth: Payer: Self-pay | Admitting: Emergency Medicine

## 2022-04-26 ENCOUNTER — Ambulatory Visit: Payer: PPO | Admitting: Pulmonary Disease

## 2022-04-26 ENCOUNTER — Encounter: Payer: Self-pay | Admitting: Pulmonary Disease

## 2022-04-26 VITALS — HR 65 | Resp 96 | Ht 70.0 in | Wt 192.2 lb

## 2022-04-26 DIAGNOSIS — J189 Pneumonia, unspecified organism: Secondary | ICD-10-CM

## 2022-04-26 DIAGNOSIS — J479 Bronchiectasis, uncomplicated: Secondary | ICD-10-CM | POA: Diagnosis not present

## 2022-04-26 NOTE — Telephone Encounter (Signed)
Spoke with the pt  He is c/o increased cough since had RSV vaccine last wk He is coughing up minimal yellow, blood tinged sputum  Went to UC over the w/e- neg for covid and flu  Pt requesting appt  Appt with Dr Valeta Harms for 3:45 pm today  ED sooner prn

## 2022-04-26 NOTE — Patient Instructions (Addendum)
Thank you for visiting Dr. Valeta Harms at [facility]. Today we recommend the following:  Follow up appt with 2v CXR and appt with Dr. Brock Ra  Complete course of augmentin started by urgent care  Return in about 6 weeks (around 06/07/2022) for with APP or Londell Moh, MD.    Please do your part to reduce the spread of COVID-19.

## 2022-04-26 NOTE — Telephone Encounter (Signed)
Patient called requesting an appointment with RB- he is in procedures all day and no opening with Nps. Patient is coughing up blood, fever and chills since Friday. Patient is suppose to go out of town tomorrow and is wanting to be seen. Please advise on work in or alternative.  Please call mobile then home phone if patient doesn't answer.

## 2022-04-26 NOTE — Progress Notes (Signed)
Synopsis: Referred in September 2023 for cough fever, by Eulas Post, MD  Subjective:   PATIENT ID: Eric Gory GENDER: male DOB: 12-10-1940, MRN: [id number]  Chief Complaint  Patient presents with   Acute Visit    After pneu. vaccine.    This is a 81 year old gentleman, past medical history of bronchiectasis, history of non-Hodgkin's lymphoma in 2000, prostate cancer.Patient presents with complaint of cough fatigue sinusitis.  Was seen by primary care on 03/02/2022, office note Dr. Burnett Kanaris reviewed.  Patient was treated with Mucinex, as needed albuterol and doxycycline.  Patient has no complaints today.  His fever is pretty much dissipated.  He still not sure if he had a low-grade temp or not.  He was started on Augmentin 2 days ago.  He had a chest x-ray completed at [facility] urgent care diagnosed with right middle lobe pneumonia.  His CT imaging back from 2020 also shows some small platelike atelectasis in the right middle lobe.  Unsure how chronic this is.    Past Medical History:  Diagnosis Date   Allergic rhinitis    Arthritis    Bronchiectasis pulmologist-  dr Lamonte Sakai ([place])--  per lov note 03-22-2016 stable   intermittant--  per pt last bout yr ago 2016    Eczema    Elevated PSA    History of colon polyps    1999   History of concussion    2005 and 2010 with mild cerebral bleed resolved -no residual   History of non-Hodgkin's lymphoma oncologist-  dr Benay Spice--  pt in remission   dx 03/ 2000  ,  Stage IA,  scalp/forehead ,  post chemo/radioation therapy   Hyperlipidemia    diet controlled, no med   Lower urinary tract symptoms (LUTS)    Prostate cancer [facility]) urologist-  dr Alyson Ingles  oncologist-  dr Alen Blew and dr manning   cT2b N0 M0, Gleason 4+3,  PSA 4.2,  vol 35.0cc--  plan IMRT w/ gold seeds and ADT   Wears glasses      Family History  Problem Relation Age of Onset   Heart disease Father 17   Cancer Brother        prostate   Brain cancer Mother     Colon cancer Neg Hx    Colon polyps Neg Hx    Esophageal cancer Neg Hx    Rectal cancer Neg Hx    Stomach cancer Neg Hx      Past Surgical History:  Procedure Laterality Date   CATARACT EXTRACTION W/ INTRAOCULAR LENS  IMPLANT, BILATERAL  2013   CHOLECYSTECTOMY  1994   COLONOSCOPY  last one 07-14-2015   GOLD SEED IMPLANT N/A 04/16/2016   Procedure: GOLD SEED IMPLANT;  Surgeon: Cleon Gustin, MD;  Location: [facility];  Service: Urology;  Laterality: N/A;   PROSTATE BIOPSY N/A 01/12/2016   Procedure: BIOPSY TRANSRECTAL ULTRASONIC PROSTATE (TUBP);  Surgeon: Cleon Gustin, MD;  Location: [facility];  Service: Urology;  Laterality: N/A;   UMBILICAL HERNIA REPAIR  1992    Social History   Socioeconomic History   Marital status: Married    Spouse name: Not on file   Number of children: Not on file   Years of education: Not on file   Highest education level: Associate degree: academic program  Occupational History   Occupation: retired    Fish farm manager: RETIRED  Tobacco Use   Smoking status: Former    Packs/day: 1.00    Years:  12.00    Total pack years: 12.00    Types: Cigarettes    Start date: 76    Quit date: 08/03/1975    Years since quitting: 46.7   Smokeless tobacco: Never  Vaping Use   Vaping Use: Never used  Substance and Sexual Activity   Alcohol use: Yes    Alcohol/week: 7.0 standard drinks of alcohol    Types: 7 Glasses of wine per week    Comment: 1 glass of wine daily   Drug use: No   Sexual activity: Yes  Other Topics Concern   Not on file  Social History Narrative   Right handed   Drinks caffeine   Two story home   Social Determinants of Health   Financial Resource Strain: Low Risk  (12/16/2021)   Overall Financial Resource Strain (CARDIA)    Difficulty of Paying Living Expenses: Not hard at all  Food Insecurity: No Food Insecurity (12/16/2021)   Hunger Vital Sign    Worried About Running Out of Food in the Last  Year: Never true    Ran Out of Food in the Last Year: Never true  Transportation Needs: No Transportation Needs (12/16/2021)   PRAPARE - Hydrologist (Medical): No    Lack of Transportation (Non-Medical): No  Physical Activity: Unknown (12/16/2021)   Exercise Vital Sign    Days of Exercise per Week: 0 days    Minutes of Exercise per Session: Not on file  Stress: Stress Concern Present (12/16/2021)   [place]    Feeling of Stress : To some extent  Social Connections: Moderately Integrated (12/16/2021)   Social Connection and Isolation Panel [NHANES]    Frequency of Communication with Friends and Family: More than three times a week    Frequency of Social Gatherings with Friends and Family: Once a week    Attends Religious Services: More than 4 times per year    Active Member of [employer] or Organizations: No    Attends Music therapist: Not on file    Marital Status: Married  Human resources officer Violence: Not on file     Allergies  Allergen Reactions   Pollen Extract-Tree Extract [Pollen Extract]     Weed allergies also     Outpatient Medications Prior to Visit  Medication Sig Dispense Refill   albuterol (VENTOLIN HFA) 108 (90 Base) MCG/ACT inhaler Inhale 2 puffs into the lungs every 6 (six) hours as needed for wheezing or shortness of breath. 8 Brock 1   amoxicillin-clavulanate (AUGMENTIN) 875-125 MG tablet Take 1 tablet by mouth 2 (two) times daily.     fexofenadine (ALLEGRA) 180 MG tablet Take 180 mg by mouth every morning.     fluticasone (FLONASE) 50 MCG/ACT nasal spray SPRAY TWO SPRAYS IN EACH NOSTRIL DAILY AS NEEDED 16 Brock 2   GuaiFENesin (MUCINEX PO) Take 1,200 mg by mouth 2 (two) times daily. Extended release     promethazine-dextromethorphan (PROMETHAZINE-DM) 6.25-15 MG/5ML syrup Take by mouth.     Respiratory Therapy Supplies (FLUTTER) DEVI 1 Device by Does not apply route as  needed. 1 each 0   Acetaminophen (TYLENOL PO) Take by mouth.     Avanafil 200 MG TABS Take 1 tablet by mouth once a week.     ciclopirox (LOPROX) 0.77 % cream Apply topically 2 (two) times daily. 15 Brock 1   Desoximetasone (TOPICORT) 0.25 % ointment Apply 1 application topically 2 (two) times daily. [place]  Brock 2   doxycycline (VIBRAMYCIN) 100 MG capsule Take 1 capsule (100 mg total) by mouth 2 (two) times daily. (Patient not taking: Reported on 04/12/2022) 14 capsule 0   EPINEPHrine 0.3 mg/0.3 mL IJ SOAJ injection Inject into the muscle. (Patient not taking: Reported on 04/12/2022)     escitalopram (LEXAPRO) 10 MG tablet Take 1 tablet (10 mg total) by mouth daily. 90 tablet 3   ibuprofen (ADVIL,MOTRIN) 200 MG tablet Take 200 mg by mouth every 6 (six) hours as needed.     mometasone (ELOCON) 0.1 % cream Apply topically.     Multiple Vitamins-Minerals (CENTRUM ADULTS PO) Take 1 tablet by mouth daily.     rosuvastatin (CRESTOR) 10 MG tablet Take 1 tablet (10 mg total) by mouth daily. 90 tablet 3   sildenafil (VIAGRA) 100 MG tablet Take 0.5-1 tablets (50-100 mg total) by mouth daily as needed for erectile dysfunction. 6 tablet 11   No facility-administered medications prior to visit.    Review of Systems  Constitutional:  Positive for fever. Negative for chills, malaise/fatigue and weight loss.  HENT:  Negative for hearing loss, sore throat and tinnitus.   Eyes:  Negative for blurred vision and double vision.  Respiratory:  Positive for cough and shortness of breath. Negative for hemoptysis, sputum production, wheezing and stridor.   Cardiovascular:  Negative for chest pain, palpitations, orthopnea, leg swelling and PND.  Gastrointestinal:  Negative for abdominal pain, constipation, diarrhea, heartburn, nausea and vomiting.  Genitourinary:  Negative for dysuria, hematuria and urgency.  Musculoskeletal:  Negative for joint pain and myalgias.  Skin:  Negative for itching and rash.  Neurological:  Negative  for dizziness, tingling, weakness and headaches.  Endo/Heme/Allergies:  Negative for environmental allergies. Does not bruise/bleed easily.  Psychiatric/Behavioral:  Negative for depression. The patient is not nervous/anxious and does not have insomnia.   All other systems reviewed and are negative.    Objective:  Physical Exam Vitals reviewed.  Constitutional:      General: He is not in acute distress.    Appearance: He is well-developed.  HENT:     Head: Normocephalic and atraumatic.     Comments: Looked in his mouth under his left tongue there was a small dot/raised pimple appearing lesion.  He said this was new. Eyes:     General: No scleral icterus.    Conjunctiva/sclera: Conjunctivae normal.     Pupils: Pupils are equal, round, and reactive to light.  Neck:     Vascular: No JVD.     Trachea: No tracheal deviation.  Cardiovascular:     Rate and Rhythm: Normal rate and regular rhythm.     Heart sounds: Normal heart sounds. No murmur heard. Pulmonary:     Effort: Pulmonary effort is normal. No tachypnea, accessory muscle usage or respiratory distress.     Breath sounds: No stridor. No wheezing, rhonchi or rales.  Abdominal:     General: There is no distension.     Palpations: Abdomen is soft.     Tenderness: There is no abdominal tenderness.  Musculoskeletal:        General: No tenderness.     Cervical back: Neck supple.  Lymphadenopathy:     Cervical: No cervical adenopathy.  Skin:    General: Skin is warm and dry.     Capillary Refill: Capillary refill takes less than 2 seconds.     Findings: No rash.  Neurological:     Mental Status: He is alert and oriented to person,  place, and time.  Psychiatric:        Behavior: Behavior normal.      Vitals:   04/26/22 1604  Pulse: 65  Resp: (!) 96  Weight: 192 lb 3.2 oz (87.2 kg)  Height: '5\' 10"'$  (1.778 m)     on RA BMI Readings from Last 3 Encounters:  04/26/22 27.58 kg/m  04/12/22 27.26 kg/m  03/02/22 27.46  kg/m   Wt Readings from Last 3 Encounters:  04/26/22 192 lb 3.2 oz (87.2 kg)  04/12/22 190 lb (86.2 kg)  03/02/22 191 lb 6.4 oz (86.8 kg)     CBC    Component Value Date/Time   WBC 10.6 (H) 11/15/2021 1427   RBC 4.70 11/15/2021 1427   HGB 14.8 11/15/2021 1427   HCT 43.9 11/15/2021 1427   PLT 220 11/15/2021 1427   MCV 93.4 11/15/2021 1427   MCH 31.5 11/15/2021 1427   MCHC 33.7 11/15/2021 1427   RDW 12.5 11/15/2021 1427   LYMPHSABS 1.5 11/15/2021 1427   MONOABS 0.4 11/15/2021 1427   EOSABS 0.2 11/15/2021 1427   BASOSABS 0.1 11/15/2021 1427    Chest Imaging:  Chest x-ray report from [facility]: Right middle lobe infiltrate. Unable to review images  Pulmonary Functions Testing Results:     No data to display          FeNO:   Pathology:   Echocardiogram:   Heart Catheterization:     Assessment & Plan:     ICD-10-CM   1. Pneumonia of right middle lobe due to infectious organism  J18.9 DG Chest 2 View    2. BRONCHIECTASIS  J47.9       Discussion:  This is a 81 year old gentleman longstanding history of bronchiectasis followed by Dr. Lamonte Sakai.  Usually is treated with flutter valve.  He has not had any exacerbations recently up until few weeks ago.  He went back to the urgent care 2 days ago with fever.  Plan: Complete course of Augmentin started by urgent care. Continue flutter valve Repeat chest x-ray in 6 weeks Follow-up appointment with Dr. Lamonte Sakai after chest x-ray   Current Outpatient Medications:    albuterol (VENTOLIN HFA) 108 (90 Base) MCG/ACT inhaler, Inhale 2 puffs into the lungs every 6 (six) hours as needed for wheezing or shortness of breath., Disp: 8 Brock, Rfl: 1   amoxicillin-clavulanate (AUGMENTIN) 875-125 MG tablet, Take 1 tablet by mouth 2 (two) times daily., Disp: , Rfl:    fexofenadine (ALLEGRA) 180 MG tablet, Take 180 mg by mouth every morning., Disp: , Rfl:    fluticasone (FLONASE) 50 MCG/ACT nasal spray, SPRAY TWO SPRAYS IN EACH NOSTRIL  DAILY AS NEEDED, Disp: 16 Brock, Rfl: 2   GuaiFENesin (MUCINEX PO), Take 1,200 mg by mouth 2 (two) times daily. Extended release, Disp: , Rfl:    promethazine-dextromethorphan (PROMETHAZINE-DM) 6.25-15 MG/5ML syrup, Take by mouth., Disp: , Rfl:    Respiratory Therapy Supplies (FLUTTER) DEVI, 1 Device by Does not apply route as needed., Disp: 1 each, Rfl: 0   Acetaminophen (TYLENOL PO), Take by mouth., Disp: , Rfl:    Avanafil 200 MG TABS, Take 1 tablet by mouth once a week., Disp: , Rfl:    ciclopirox (LOPROX) 0.77 % cream, Apply topically 2 (two) times daily., Disp: 15 Brock, Rfl: 1   Desoximetasone (TOPICORT) 0.25 % ointment, Apply 1 application topically 2 (two) times daily., Disp: 30 Brock, Rfl: 2   doxycycline (VIBRAMYCIN) 100 MG capsule, Take 1 capsule (100 mg total) by mouth 2 (  two) times daily. (Patient not taking: Reported on 04/12/2022), Disp: 14 capsule, Rfl: 0   EPINEPHrine 0.3 mg/0.3 mL IJ SOAJ injection, Inject into the muscle. (Patient not taking: Reported on 04/12/2022), Disp: , Rfl:    escitalopram (LEXAPRO) 10 MG tablet, Take 1 tablet (10 mg total) by mouth daily., Disp: 90 tablet, Rfl: 3   ibuprofen (ADVIL,MOTRIN) 200 MG tablet, Take 200 mg by mouth every 6 (six) hours as needed., Disp: , Rfl:    mometasone (ELOCON) 0.1 % cream, Apply topically., Disp: , Rfl:    Multiple Vitamins-Minerals (CENTRUM ADULTS PO), Take 1 tablet by mouth daily., Disp: , Rfl:    rosuvastatin (CRESTOR) 10 MG tablet, Take 1 tablet (10 mg total) by mouth daily., Disp: 90 tablet, Rfl: 3   sildenafil (VIAGRA) 100 MG tablet, Take 0.5-1 tablets (50-100 mg total) by mouth daily as needed for erectile dysfunction., Disp: 6 tablet, Rfl: Raymond, DO [facility] 04/26/2022 4:28 PM

## 2022-05-04 ENCOUNTER — Encounter: Payer: Self-pay | Admitting: Neurology

## 2022-05-04 ENCOUNTER — Encounter: Payer: Self-pay | Admitting: Family Medicine

## 2022-05-10 DIAGNOSIS — J3089 Other allergic rhinitis: Secondary | ICD-10-CM | POA: Diagnosis not present

## 2022-05-10 DIAGNOSIS — J301 Allergic rhinitis due to pollen: Secondary | ICD-10-CM | POA: Diagnosis not present

## 2022-05-10 DIAGNOSIS — J3081 Allergic rhinitis due to animal (cat) (dog) hair and dander: Secondary | ICD-10-CM | POA: Diagnosis not present

## 2022-05-25 ENCOUNTER — Telehealth: Payer: Self-pay | Admitting: Emergency Medicine

## 2022-05-25 DIAGNOSIS — J301 Allergic rhinitis due to pollen: Secondary | ICD-10-CM | POA: Diagnosis not present

## 2022-05-25 DIAGNOSIS — J3081 Allergic rhinitis due to animal (cat) (dog) hair and dander: Secondary | ICD-10-CM | POA: Diagnosis not present

## 2022-05-25 DIAGNOSIS — H1045 Other chronic allergic conjunctivitis: Secondary | ICD-10-CM | POA: Diagnosis not present

## 2022-05-25 DIAGNOSIS — R052 Subacute cough: Secondary | ICD-10-CM | POA: Diagnosis not present

## 2022-05-25 DIAGNOSIS — J479 Bronchiectasis, uncomplicated: Secondary | ICD-10-CM | POA: Diagnosis not present

## 2022-05-25 MED ORDER — PREDNISONE 10 MG PO TABS: 20.0000 mg | ORAL_TABLET | Freq: Every day | ORAL | 0 refills | Status: DC

## 2022-05-25 MED ORDER — DOXYCYCLINE HYCLATE 100 MG PO TABS: 100.0000 mg | ORAL_TABLET | Freq: Two times a day (BID) | ORAL | 0 refills | Status: DC

## 2022-05-25 NOTE — Telephone Encounter (Signed)
I called the patient and let him know that the provider recommended a round of Doxycycline and Prednisone as well. The patient is agreeable to the recommendations and verified the pharmacy as well. I have sent in the medications and nothing further is needed.

## 2022-05-25 NOTE — Telephone Encounter (Signed)
Spoke with the pt  He is c/o increased cough- prod with green sputum, chest congestion and "can feel wheezing"  Symptoms started 4 days ago  He denies increased SOB, fevers, HA, body aches, sore throat  He is taking advil cold and sinus and mucinex  No appts open until later in the week  Please advise, thanks!  Allergies  Allergen Reactions   Pollen Extract-Tree Extract [Pollen Extract]     Weed allergies also

## 2022-05-25 NOTE — Telephone Encounter (Signed)
He was treated with abx 1 month ago -  Would treat him w doxycycline '100mg'$  bid x 7 days Offer him prednisone '20mg'$  qd x 5 days if he is willing to take it.  He needs an OV w RB or APP to talk about the pattern of recurrence, possible need for any repeat chest imaging.

## 2022-06-07 ENCOUNTER — Encounter: Payer: Self-pay | Admitting: Primary Care

## 2022-06-07 ENCOUNTER — Ambulatory Visit (INDEPENDENT_AMBULATORY_CARE_PROVIDER_SITE_OTHER): Payer: PPO

## 2022-06-07 ENCOUNTER — Ambulatory Visit: Payer: PPO | Admitting: Primary Care

## 2022-06-07 VITALS — BP 110/62 | HR 58 | Temp 98.3°F | Ht 70.0 in | Wt 191.8 lb

## 2022-06-07 DIAGNOSIS — J479 Bronchiectasis, uncomplicated: Secondary | ICD-10-CM

## 2022-06-07 DIAGNOSIS — J189 Pneumonia, unspecified organism: Secondary | ICD-10-CM

## 2022-06-07 NOTE — Progress Notes (Signed)
$'@Patient'P$  ID: Eric Brock, male    DOB: Jan 04, 1941, 80 y.o.   MRN: [id number]  Chief Complaint  Patient presents with   Follow-up    Referring provider: Eulas Post, MD  HPI: 81 year old male, former smoker. PMH significant for bronchiectasis. Patient of Dr. Lamonte Sakai.   Previous [facility] pulmonary encounter:  This is a 81 year old gentleman, past medical history of bronchiectasis, history of non-Hodgkin's lymphoma in 2000, prostate cancer.Patient presents with complaint of cough fatigue sinusitis.  Was seen by primary care on 03/02/2022, office note Dr. Burnett Kanaris reviewed.  Patient was treated with Mucinex, as needed albuterol and doxycycline.  Patient has no complaints today.  His fever is pretty much dissipated.  He still not sure if he had a low-grade temp or not.  He was started on Augmentin 2 days ago.  He had a chest x-ray completed at [facility] urgent care diagnosed with right middle lobe pneumonia.  His CT imaging back from 2020 also shows some small platelike atelectasis in the right middle lobe.  Unsure how chronic this is.  06/07/2022 Patient presents today follow-up. He has had recurrent flare ups. He called office on 05/25/22 with symptoms of productive cough with green sputum, chest congestion and wheezing x 4 days. He was send in prescription for Doxycycline '100mg'$  twice and 7 days.  He had purulent sputum with blood tinge. Mucus was thick.  He completed abx and prednisone. Symptoms cleared up within 4 days. Never felt like he needed to use rescue inhaler.  He continues to take mucinex '1200mg'$  twice daily along with Allegra '180mg'$  daily. Not currently using any OTC nasal sprays or flutter valve. He has no issue bring up mucus. He has chronic sinus issues. He has an appointment with ENT in 1 month. [facility]elman MRI in May 2023 showed extensive mucosal thickening in the ethmoid air cells with layer fluid in the mecillary sinuses.    Allergies  Allergen Reactions   Pollen Extract-Tree  Extract [Pollen Extract]     Weed allergies also    Immunization History  Administered Date(s) Administered   Fluad Quad(high Dose 65+) 05/06/2020, 04/08/2021, 04/15/2022   Influenza Split 05/03/2011, 04/04/2012, 04/02/2013, 04/02/2014   Influenza Whole 05/03/2007, 05/13/2010   Influenza, High Dose Seasonal PF 05/15/2015, 04/21/2016, 06/02/2017, 05/04/2018, 04/27/2019, 05/28/2019, 11/26/2019, 05/26/2020, 05/26/2021   Influenza,inj,Quad PF,6+ Mos 03/20/2014   PFIZER Comirnaty(Gray Top)Covid-19 Tri-Sucrose Vaccine 11/14/2020   PFIZER(Purple Top)SARS-COV-2 Vaccination 08/16/2019, 09/06/2019, 05/06/2020   Pneumococcal Conjugate-13 03/20/2014   Pneumococcal Polysaccharide-23 08/02/2002, 08/02/2005, 02/25/2010, 05/28/2019, 11/26/2019, 05/26/2021   Td 08/02/2005   Tdap 07/27/2016   Varicella 08/02/2009    Past Medical History:  Diagnosis Date   Allergic rhinitis    Arthritis    Bronchiectasis pulmologist-  dr Lamonte Sakai ([place])--  per lov note 03-22-2016 stable   intermittant--  per pt last bout yr ago 2016    Eczema    Elevated PSA    History of colon polyps    1999   History of concussion    2005 and 2010 with mild cerebral bleed resolved -no residual   History of non-Hodgkin's lymphoma oncologist-  dr Benay Spice--  pt in remission   dx 03/ 2000  ,  Stage IA,  scalp/forehead ,  post chemo/radioation therapy   Hyperlipidemia    diet controlled, no med   Lower urinary tract symptoms (LUTS)    Prostate cancer [facility]) urologist-  dr Alyson Ingles  oncologist-  dr Alen Blew and dr manning   cT2b N0 M0, Gleason 4+3,  PSA 4.2,  vol 35.0cc--  plan IMRT w/ gold seeds and ADT   Wears glasses     Tobacco History: Social History   Tobacco Use  Smoking Status Former   Packs/day: 1.00   Years: 12.00   Total pack years: 12.00   Types: Cigarettes   Start date: 55   Quit date: 08/03/1975   Years since quitting: 46.8  Smokeless Tobacco Never   Counseling given: Not Answered   Outpatient  Medications Prior to Visit  Medication Sig Dispense Refill   Acetaminophen (TYLENOL PO) Take by mouth.     albuterol (VENTOLIN HFA) 108 (90 Base) MCG/ACT inhaler Inhale 2 puffs into the lungs every 6 (six) hours as needed for wheezing or shortness of breath. 8 g 1   Avanafil 200 MG TABS Take 1 tablet by mouth once a week.     ciclopirox (LOPROX) 0.77 % cream Apply topically 2 (two) times daily. 15 g 1   Desoximetasone (TOPICORT) 0.25 % ointment Apply 1 application topically 2 (two) times daily. 30 g 2   doxycycline (VIBRA-TABS) 100 MG tablet Take 1 tablet (100 mg total) by mouth 2 (two) times daily. 14 tablet 0   EPINEPHrine 0.3 mg/0.3 mL IJ SOAJ injection Inject into the muscle.     escitalopram (LEXAPRO) 10 MG tablet Take 1 tablet (10 mg total) by mouth daily. 90 tablet 3   fexofenadine (ALLEGRA) 180 MG tablet Take 180 mg by mouth every morning.     fluticasone (FLONASE) 50 MCG/ACT nasal spray SPRAY TWO SPRAYS IN EACH NOSTRIL DAILY AS NEEDED 16 g 2   GuaiFENesin (MUCINEX PO) Take 1,200 mg by mouth 2 (two) times daily. Extended release     ibuprofen (ADVIL,MOTRIN) 200 MG tablet Take 200 mg by mouth every 6 (six) hours as needed.     mometasone (ELOCON) 0.1 % cream Apply topically.     Multiple Vitamins-Minerals (CENTRUM ADULTS PO) Take 1 tablet by mouth daily.     predniSONE (DELTASONE) 10 MG tablet Take 2 tablets (20 mg total) by mouth daily with breakfast. 10 tablet 0   Respiratory Therapy Supplies (FLUTTER) DEVI 1 Device by Does not apply route as needed. 1 each 0   rosuvastatin (CRESTOR) 10 MG tablet Take 1 tablet (10 mg total) by mouth daily. 90 tablet 3   sildenafil (VIAGRA) 100 MG tablet Take 0.5-1 tablets (50-100 mg total) by mouth daily as needed for erectile dysfunction. 6 tablet 11   No facility-administered medications prior to visit.    Review of Systems  Review of Systems  Constitutional: Negative.   HENT: Negative.    Respiratory:  Positive for cough. Negative for chest  tightness, shortness of breath and wheezing.      Physical Exam  BP 110/62 (BP Location: Left Arm, Patient Position: Sitting, Cuff Size: Normal)   Pulse (!) 58   Temp 98.3 F (36.8 C) (Oral)   Ht '5\' 10"'$  (1.778 m)   Wt 191 lb 12.8 oz (87 kg)   SpO2 99%   BMI 27.52 kg/m  Physical Exam Constitutional:      Appearance: Normal appearance.  HENT:     Head: Normocephalic and atraumatic.     Mouth/Throat:     Mouth: Mucous membranes are moist.     Pharynx: Oropharynx is clear.  Cardiovascular:     Rate and Rhythm: Normal rate and regular rhythm.  Pulmonary:     Effort: Pulmonary effort is normal.     Breath sounds: Normal breath sounds. No  wheezing, rhonchi or rales.  Musculoskeletal:        General: Normal range of motion.  Neurological:     General: No focal deficit present.     Mental Status: He is alert and oriented to person, place, and time. Mental status is at baseline.  Psychiatric:        Mood and Affect: Mood normal.        Behavior: Behavior normal.        Thought Content: Thought content normal.        Judgment: Judgment normal.      Lab Results:  CBC    Component Value Date/Time   WBC 10.6 (H) 11/15/2021 1427   RBC 4.70 11/15/2021 1427   HGB 14.8 11/15/2021 1427   HCT 43.9 11/15/2021 1427   PLT 220 11/15/2021 1427   MCV 93.4 11/15/2021 1427   MCH 31.5 11/15/2021 1427   MCHC 33.7 11/15/2021 1427   RDW 12.5 11/15/2021 1427   LYMPHSABS 1.5 11/15/2021 1427   MONOABS 0.4 11/15/2021 1427   EOSABS 0.2 11/15/2021 1427   BASOSABS 0.1 11/15/2021 1427    BMET    Component Value Date/Time   NA 139 11/15/2021 1427   K 3.8 11/15/2021 1427   CL 103 11/15/2021 1427   CO2 26 11/15/2021 1427   GLUCOSE 102 (H) 11/15/2021 1427   BUN 17 11/15/2021 1427   CREATININE 0.91 11/15/2021 1427   CREATININE 1.18 07/11/2020 1453   CALCIUM 9.7 11/15/2021 1427   GFRNONAA >60 11/15/2021 1427   GFRAA 97 12/07/2007 1005    BNP No results found for: "BNP"  ProBNP No  results found for: "PROBNP"  Imaging: No results found.   Assessment & Plan:   BRONCHIECTASIS - Treated for acute exacerbation of bronchiectasis end of October 2023 with Doxycycline and prednisone. He had productive cough with some blood tinge at that time. Symptoms resolved after 4 days, no further hemoptysis. CXR today pending. Advised he continue mucinex '1200mg'$  twice daily and use flutter valve for mucoid clearance 2-3 times a day. Recommend patient bring in sputum culture if able.    Martyn Ehrich, NP 06/07/2022

## 2022-06-07 NOTE — Assessment & Plan Note (Addendum)
-   Treated for acute exacerbation of bronchiectasis end of October 2023 with Doxycycline and prednisone. He had productive cough with some blood tinge at that time. Symptoms resolved after 4 days, no further hemoptysis. CXR today pending. Advised he continue mucinex '1200mg'$  twice daily and use flutter valve for mucoid clearance 2-3 times a day. Recommend patient bring in sputum culture if able.

## 2022-06-07 NOTE — Patient Instructions (Addendum)
Recommendations: - Continue mucinex 1,'200mg'$  tablet twice (take with full glass of water) - Resume flonase nasal spray daily- 1 spray per nostril once daily - Use saline ocean spray as needed to loosen congestion no more than once a day  - Use flutter valve 2-3 times a day when able  - Continue Allegra '10mg'$  daily (oral antihistamine)  Orders: - CXR (done- we will call with results later today)  Follow-up: -3 months with Dr. Lamonte Sakai    Bronchiectasis  Bronchiectasis is a condition in which the airways in the lungs (bronchi) are damaged and widened. The condition makes it hard for the lungs to get rid of mucus, and it causes mucus to gather in the bronchi. This condition often leads to lung infections, which can make the condition worse. What are the causes? You can be born with this condition, or you can develop it later in life. Common causes of this condition include: Cystic fibrosis. Repeated lung infections, such as pneumonia or tuberculosis. An object or other blockage in the lungs. Breathing in fluid, food, or other objects (aspiration). A problem with the body's defense system (immune system) and lung structure that is present at birth (congenital). Sometimes the cause is not known. What are the signs or symptoms? Common symptoms of this condition include: A daily cough that brings up mucus and lasts for more than 3 weeks. Lung infections that happen often. Shortness of breath and wheezing. Weakness and feeling tired (fatigue). How is this diagnosed? This condition is diagnosed with tests, such as: Chest X-rays or CT scans. These are done to check for changes in the lungs. Breathing tests. These are done to check how well your lungs are working. A test of a sample of your saliva (sputum culture). This test is done to check for infection. Blood tests and other tests. These are done to check for related diseases or causes. How is this treated? Treatment for this condition  depends on the severity of the illness and its cause. Treatment may include: Medicines that loosen mucus so it can be coughed up (mucolytics). Medicines that relax the muscles of the bronchi (bronchodilators). Antibiotic medicines to prevent or treat infection. Physical therapy to help clear mucus from the lungs. Techniques may include: Postural drainage. This is when you sit or lie in certain positions so that mucus can drain by gravity. Chest percussion. This involves tapping the chest or back with a cupped hand. Chest vibration. For this therapy, a hand or special equipment vibrates your chest and back. Surgery to remove the affected part of the lung. This may be done in severe cases. Follow these instructions at home: Medicines Take over-the-counter and prescription medicines only as told by your health care provider. If you were prescribed an antibiotic medicine, take it as told by your health care provider. Do not stop taking the antibiotic even if you start to feel better. Avoid taking sedatives and antihistamines unless your health care provider tells you to take them. These medicines tend to thicken the mucus in the lungs. Managing symptoms Do breathing exercises or techniques to clear your lungs as told by your health care provider. Consider using a cold steam vaporizer or humidifier in your room or home to help loosen secretions. If you have a cough that gets worse at night, try sleeping in a semi-upright position. General instructions Get plenty of rest. Drink enough fluid to keep your urine pale yellow. Stay inside when pollution and ozone levels are high. Stay up to date  with vaccinations and immunizations. Avoid cigarette smoke and other lung irritants. Do not use any products that contain nicotine or tobacco. These products include cigarettes, chewing tobacco, and vaping devices, such as e-cigarettes. If you need help quitting, ask your health care provider. Keep all  follow-up visits. This is important. Contact a health care provider if: You cough up more sputum than before and the sputum is yellow or green in color. You have a fever or chills. You cannot control your cough and are losing sleep. Get help right away if: You cough up blood. You have chest pain. You have increasing shortness of breath. You have pain that gets worse or is not controlled with medicines. You have a fever and your symptoms suddenly get worse. These symptoms may be an emergency. Get help right away. Call 911. Do not wait to see if the symptoms will go away. Do not drive yourself to the hospital. Summary Bronchiectasis is a condition in which the airways in the lungs (bronchi) are damaged and widened. The condition makes it hard for the lungs to get rid of mucus, and it causes mucus to gather in the bronchi. Treatment usually includes therapy to help clear mucus from the lungs. Avoid cigarette smoke and other lung irritants. Stay up to date with vaccinations and immunizations. This information is not intended to replace advice given to you by your health care provider. Make sure you discuss any questions you have with your health care provider. Document Revised: 04/01/2021 Document Reviewed: 02/17/2021 Elsevier Patient Education  [place].

## 2022-06-09 ENCOUNTER — Telehealth: Payer: Self-pay | Admitting: Primary Care

## 2022-06-09 DIAGNOSIS — J3089 Other allergic rhinitis: Secondary | ICD-10-CM | POA: Diagnosis not present

## 2022-06-09 DIAGNOSIS — J3081 Allergic rhinitis due to animal (cat) (dog) hair and dander: Secondary | ICD-10-CM | POA: Diagnosis not present

## 2022-06-09 DIAGNOSIS — J301 Allergic rhinitis due to pollen: Secondary | ICD-10-CM | POA: Diagnosis not present

## 2022-06-09 NOTE — Telephone Encounter (Signed)
Please let patient know CXR showed mild right middle lobe and lingular opacities representing atelectasis/scarring. Since he is feeling better would not change plan. Use flutter valve and take mucinex. If cough or hemoptysis worsen notify office.

## 2022-06-09 NOTE — Telephone Encounter (Signed)
I sent message to Raquel Sarna with results this morning

## 2022-06-09 NOTE — Telephone Encounter (Signed)
Called and spoke with pt letting him know the results of cxr and info per BW and he verbalized understanding. Nothing further needed.

## 2022-06-09 NOTE — Telephone Encounter (Signed)
Can you make sure someone calls him

## 2022-06-15 DIAGNOSIS — C61 Malignant neoplasm of prostate: Secondary | ICD-10-CM | POA: Diagnosis not present

## 2022-06-16 DIAGNOSIS — J301 Allergic rhinitis due to pollen: Secondary | ICD-10-CM | POA: Diagnosis not present

## 2022-06-16 DIAGNOSIS — J3081 Allergic rhinitis due to animal (cat) (dog) hair and dander: Secondary | ICD-10-CM | POA: Diagnosis not present

## 2022-06-16 DIAGNOSIS — J3089 Other allergic rhinitis: Secondary | ICD-10-CM | POA: Diagnosis not present

## 2022-06-21 DIAGNOSIS — J3081 Allergic rhinitis due to animal (cat) (dog) hair and dander: Secondary | ICD-10-CM | POA: Diagnosis not present

## 2022-06-21 DIAGNOSIS — J301 Allergic rhinitis due to pollen: Secondary | ICD-10-CM | POA: Diagnosis not present

## 2022-06-21 DIAGNOSIS — J3089 Other allergic rhinitis: Secondary | ICD-10-CM | POA: Diagnosis not present

## 2022-06-22 DIAGNOSIS — C61 Malignant neoplasm of prostate: Secondary | ICD-10-CM | POA: Diagnosis not present

## 2022-07-02 DIAGNOSIS — J301 Allergic rhinitis due to pollen: Secondary | ICD-10-CM | POA: Diagnosis not present

## 2022-07-02 DIAGNOSIS — J3089 Other allergic rhinitis: Secondary | ICD-10-CM | POA: Diagnosis not present

## 2022-07-02 DIAGNOSIS — J3081 Allergic rhinitis due to animal (cat) (dog) hair and dander: Secondary | ICD-10-CM | POA: Diagnosis not present

## 2022-07-06 DIAGNOSIS — R0982 Postnasal drip: Secondary | ICD-10-CM | POA: Diagnosis not present

## 2022-07-06 DIAGNOSIS — J342 Deviated nasal septum: Secondary | ICD-10-CM | POA: Diagnosis not present

## 2022-07-06 DIAGNOSIS — J343 Hypertrophy of nasal turbinates: Secondary | ICD-10-CM | POA: Diagnosis not present

## 2022-07-06 DIAGNOSIS — J310 Chronic rhinitis: Secondary | ICD-10-CM | POA: Diagnosis not present

## 2022-07-07 DIAGNOSIS — J301 Allergic rhinitis due to pollen: Secondary | ICD-10-CM | POA: Diagnosis not present

## 2022-07-07 DIAGNOSIS — J3081 Allergic rhinitis due to animal (cat) (dog) hair and dander: Secondary | ICD-10-CM | POA: Diagnosis not present

## 2022-07-07 DIAGNOSIS — J3089 Other allergic rhinitis: Secondary | ICD-10-CM | POA: Diagnosis not present

## 2022-07-14 DIAGNOSIS — J301 Allergic rhinitis due to pollen: Secondary | ICD-10-CM | POA: Diagnosis not present

## 2022-07-14 DIAGNOSIS — J3089 Other allergic rhinitis: Secondary | ICD-10-CM | POA: Diagnosis not present

## 2022-07-14 DIAGNOSIS — J3081 Allergic rhinitis due to animal (cat) (dog) hair and dander: Secondary | ICD-10-CM | POA: Diagnosis not present

## 2022-07-29 DIAGNOSIS — J3089 Other allergic rhinitis: Secondary | ICD-10-CM | POA: Diagnosis not present

## 2022-07-29 DIAGNOSIS — J301 Allergic rhinitis due to pollen: Secondary | ICD-10-CM | POA: Diagnosis not present

## 2022-07-29 DIAGNOSIS — J3081 Allergic rhinitis due to animal (cat) (dog) hair and dander: Secondary | ICD-10-CM | POA: Diagnosis not present

## 2022-08-04 DIAGNOSIS — J3089 Other allergic rhinitis: Secondary | ICD-10-CM | POA: Diagnosis not present

## 2022-08-04 DIAGNOSIS — J3081 Allergic rhinitis due to animal (cat) (dog) hair and dander: Secondary | ICD-10-CM | POA: Diagnosis not present

## 2022-08-04 DIAGNOSIS — J301 Allergic rhinitis due to pollen: Secondary | ICD-10-CM | POA: Diagnosis not present

## 2022-08-11 DIAGNOSIS — J301 Allergic rhinitis due to pollen: Secondary | ICD-10-CM | POA: Diagnosis not present

## 2022-08-11 DIAGNOSIS — J3081 Allergic rhinitis due to animal (cat) (dog) hair and dander: Secondary | ICD-10-CM | POA: Diagnosis not present

## 2022-08-11 DIAGNOSIS — J3089 Other allergic rhinitis: Secondary | ICD-10-CM | POA: Diagnosis not present

## 2022-08-17 DIAGNOSIS — J301 Allergic rhinitis due to pollen: Secondary | ICD-10-CM | POA: Diagnosis not present

## 2022-08-17 DIAGNOSIS — J3081 Allergic rhinitis due to animal (cat) (dog) hair and dander: Secondary | ICD-10-CM | POA: Diagnosis not present

## 2022-08-17 DIAGNOSIS — J3089 Other allergic rhinitis: Secondary | ICD-10-CM | POA: Diagnosis not present

## 2022-08-23 ENCOUNTER — Encounter: Payer: Self-pay | Admitting: Family Medicine

## 2022-08-23 NOTE — Telephone Encounter (Signed)
Noted  

## 2022-08-24 DIAGNOSIS — J3081 Allergic rhinitis due to animal (cat) (dog) hair and dander: Secondary | ICD-10-CM | POA: Diagnosis not present

## 2022-08-24 DIAGNOSIS — J3089 Other allergic rhinitis: Secondary | ICD-10-CM | POA: Diagnosis not present

## 2022-08-24 DIAGNOSIS — J301 Allergic rhinitis due to pollen: Secondary | ICD-10-CM | POA: Diagnosis not present

## 2022-08-30 ENCOUNTER — Ambulatory Visit (INDEPENDENT_AMBULATORY_CARE_PROVIDER_SITE_OTHER): Payer: PPO | Admitting: Family Medicine

## 2022-08-30 ENCOUNTER — Encounter: Payer: Self-pay | Admitting: Family Medicine

## 2022-08-30 VITALS — BP 110/60 | HR 70 | Temp 98.8°F | Ht 70.0 in | Wt 192.3 lb

## 2022-08-30 DIAGNOSIS — J3089 Other allergic rhinitis: Secondary | ICD-10-CM | POA: Diagnosis not present

## 2022-08-30 DIAGNOSIS — K429 Umbilical hernia without obstruction or gangrene: Secondary | ICD-10-CM | POA: Diagnosis not present

## 2022-08-30 DIAGNOSIS — J3081 Allergic rhinitis due to animal (cat) (dog) hair and dander: Secondary | ICD-10-CM | POA: Diagnosis not present

## 2022-08-30 DIAGNOSIS — J301 Allergic rhinitis due to pollen: Secondary | ICD-10-CM | POA: Diagnosis not present

## 2022-08-30 DIAGNOSIS — L570 Actinic keratosis: Secondary | ICD-10-CM | POA: Diagnosis not present

## 2022-08-30 NOTE — Progress Notes (Signed)
Established Patient Office Visit  Subjective   Patient ID: Eric Brock, male    DOB: March 30, 1941  Age: 82 y.o. MRN: [id number]  Chief Complaint  Patient presents with   Skin Problem    HPI   Eric Brock is seen with some skin concerns.  He has a couple places on his forehead that have been treated previously with liquid nitrogen.  He frequently wears a cap outdoors and tries to be diligent about sun protection.  He particular has a place in mid forehead that is slightly sore.  He has noticed a scaly place on his left ear as well.  No ulceration.  Generally doing well otherwise overall.  No recent chest pains.  Appetite and weight stable.  He has history of bronchiectasis and is followed by pulmonary.  Past history of prostate cancer.  He does have small umbilical hernia but this is basically asymptomatic.  He had questions regarding whether he should have this repaired.  He has also history of non-Hodgkin's lymphoma.  Past Medical History:  Diagnosis Date   Allergic rhinitis    Arthritis    Bronchiectasis pulmologist-  dr Lamonte Sakai ()--  per lov note 03-22-2016 stable   intermittant--  per pt last bout yr ago 2016    Eczema    Elevated PSA    History of colon polyps    1999   History of concussion    2005 and 2010 with mild cerebral bleed resolved -no residual   History of non-Hodgkin's lymphoma oncologist-  dr Benay Spice--  pt in remission   dx 03/ 2000  ,  Stage IA,  scalp/forehead ,  post chemo/radioation therapy   Hyperlipidemia    diet controlled, no med   Lower urinary tract symptoms (LUTS)    Prostate cancer [facility]) urologist-  dr Alyson Ingles  oncologist-  dr Alen Blew and dr manning   cT2b N0 M0, Gleason 4+3,  PSA 4.2,  vol 35.0cc--  plan IMRT w/ gold seeds and ADT   Wears glasses    Past Surgical History:  Procedure Laterality Date   CATARACT EXTRACTION W/ INTRAOCULAR LENS  IMPLANT, BILATERAL  2013   CHOLECYSTECTOMY  1994   COLONOSCOPY  last one 07-14-2015   GOLD SEED  IMPLANT N/A 04/16/2016   Procedure: GOLD SEED IMPLANT;  Surgeon: Cleon Gustin, MD;  Location: [facility];  Service: Urology;  Laterality: N/A;   PROSTATE BIOPSY N/A 01/12/2016   Procedure: BIOPSY TRANSRECTAL ULTRASONIC PROSTATE (TUBP);  Surgeon: Cleon Gustin, MD;  Location[facility];  Service: Urology;  Laterality: N/A;   Lithium    reports that he quit smoking about 47 years ago. His smoking use included cigarettes. He started smoking about 62 years ago. He has a 12.00 pack-year smoking history. He has never used smokeless tobacco. He reports current alcohol use of about 7.0 standard drinks of alcohol per week. He reports that he does not use drugs. family history includes Brain cancer in his mother; Cancer in his brother; Heart disease (age of onset: 57) in his father. Allergies  Allergen Reactions   Pollen Extract-Tree Extract [Pollen Extract]     Weed allergies also    Review of Systems  Constitutional:  Negative for weight loss.  Cardiovascular:  Negative for chest pain.      Objective:     BP 110/60 (BP Location: Left Arm, Patient Position: Sitting, Cuff Size: Normal)   Pulse 70   Temp 98.8 F (37.1  C) (Oral)   Ht '5\' 10"'$  (1.778 m)   Wt 192 lb 4.8 oz (87.2 kg)   SpO2 95%   BMI 27.59 kg/m    Physical Exam Vitals reviewed.  Constitutional:      Appearance: Normal appearance.  Cardiovascular:     Rate and Rhythm: Normal rate and regular rhythm.  Pulmonary:     Effort: Pulmonary effort is normal.     Breath sounds: Normal breath sounds. No wheezing or rales.  Abdominal:     Comments: Small umbilical hernia which is soft and nontender  Skin:    Comments: He has approximately 1 x 1 cm superficial scaly area with slightly erythematous base mid forehead.  Left outer ear about two thirds the way down reveals proximately 3 x 3 mm slightly raised scaly area.  No pigment change.  Neurological:     Mental  Status: He is alert.      No results found for any visits on 08/30/22.    The ASCVD Risk score (Arnett DK, et al., 2019) failed to calculate for the following reasons:   The valid total cholesterol range is 130 to 320 mg/dL    Assessment & Plan:   #1 actinic keratoses involving mid forehead and left ear.  We discussed risk and benefits of liquid nitrogen therapy including risk of pain, low risk of blistering, low risk of skin infection and very low risk of scarring.  Patient consented.  We treated left outer ear and mid forehead without difficulty and patient tolerated well.  Be in touch if these are not resolving over the next few weeks Also discussed importance of ongoing good sun protection  #2 small umbilical hernia.  Basically asymptomatic.  Reassurance.  Reviewed signs and symptoms of strangulation.  We have offered surgical referral before and at this point would not pursue since mostly asymptomatic  Carolann Littler, MD

## 2022-09-08 DIAGNOSIS — J301 Allergic rhinitis due to pollen: Secondary | ICD-10-CM | POA: Diagnosis not present

## 2022-09-08 DIAGNOSIS — J3089 Other allergic rhinitis: Secondary | ICD-10-CM | POA: Diagnosis not present

## 2022-09-08 DIAGNOSIS — J3081 Allergic rhinitis due to animal (cat) (dog) hair and dander: Secondary | ICD-10-CM | POA: Diagnosis not present

## 2022-09-10 ENCOUNTER — Ambulatory Visit: Payer: PPO | Admitting: Emergency Medicine

## 2022-09-10 ENCOUNTER — Encounter: Payer: Self-pay | Admitting: Emergency Medicine

## 2022-09-10 VITALS — BP 120/68 | HR 53 | Temp 97.8°F | Ht 70.0 in | Wt 194.8 lb

## 2022-09-10 DIAGNOSIS — J479 Bronchiectasis, uncomplicated: Secondary | ICD-10-CM | POA: Diagnosis not present

## 2022-09-10 NOTE — Assessment & Plan Note (Signed)
Flare in late October which improved with antibiotics and prednisone.  Doing well since.  Good maintenance routine  Please continue your Allegra Use your fluticasone nasal spray 2 sprays each nostril as needed for increased congestion Continue allergy shots as you have been doing them Use your flutter valve when you need it for mucus clearance Keep albuterol available in case you develop shortness of breath, chest tightness, wheezing.  It would also be helpful to clear mucus from your chest when difficult to do so We will hold off on a repeat CT scan of the chest for now.  Consider doing so if you have progressive symptoms Follow Dr. Lamonte Sakai in 1 year or sooner if you have any problems.  Please call if you develop any new respiratory issues

## 2022-09-10 NOTE — Patient Instructions (Addendum)
Please continue your Allegra Use your fluticasone nasal spray 2 sprays each nostril as needed for increased congestion Continue allergy shots as you have been doing them Use your flutter valve when you need it for mucus clearance Keep albuterol available in case you develop shortness of breath, chest tightness, wheezing.  It would also be helpful to clear mucus from your chest when difficult to do so We will hold off on a repeat CT scan of the chest for now.  Consider doing so if you have progressive symptoms Follow Dr. Lamonte Sakai in 1 year or sooner if you have any problems.  Please call if you develop any new respiratory issues

## 2022-09-10 NOTE — Progress Notes (Signed)
   Subjective:    Patient ID: Eric Brock, male    DOB: 1941/02/28, 82 y.o.   MRN: [id number]  HPI  ROV 03/12/21 --pleasant 82 year old gentleman with a history of non-Hodgkin's lymphoma and prostate cancer.  We follow him for bronchiectasis, severe allergic rhinitis and associated chronic cough.  He is on immunotherapy and has benefited significantly.  Has been able to stop Allegra, fluticasone nasal spray.  His most recent CT scan of the chest was 01/2019. Today he reports that he is doing very well. Minimal congestion. Rare cough, therefore has not needed much flutter valve. He is active, limited some by knee pain.   ROV 09/10/2022 --82 year old male with history of non-Hodgkin's lymphoma and prostate cancer.  Followed for bronchiectasis and severe allergic rhinitis with associated chronic cough.  He was treated for pneumonia/bronchitis in late October 2023. He improved, no more blood, does cough some - clear mucous. Rarely needs a flutter. Remains on immunotherapy. On allegra, flonase prn. Never needs albuterol. Averages 1 flare a year.    Review of Systems As per HPI      Objective:   Physical Exam Vitals:   09/10/22 1521  BP: 120/68  Pulse: (!) 53  Temp: 97.8 F (36.6 C)  TempSrc: Oral  SpO2: 97%  Weight: 194 lb 12.8 oz (88.4 kg)  Height: '5\' 10"'$  (1.778 m)   Gen: Pleasant, well-nourished, in no distress,  normal affect  ENT: No lesions,  mouth clear,  oropharynx clear, no postnasal drip  Neck: No JVD, no stridor  Lungs: No use of accessory muscles, no crackles or wheezing on normal respiration, no wheeze on forced expiration  Cardiovascular: RRR, heart sounds normal, no murmur or gallops, no peripheral edema  Musculoskeletal: No deformities, no cyanosis or clubbing  Neuro: alert, awake, non focal  Skin: Warm, no lesions or rash       Assessment & Plan:  BRONCHIECTASIS Flare in late October which improved with antibiotics and prednisone.  Doing well since.  Good  maintenance routine  Please continue your Allegra Use your fluticasone nasal spray 2 sprays each nostril as needed for increased congestion Continue allergy shots as you have been doing them Use your flutter valve when you need it for mucus clearance Keep albuterol available in case you develop shortness of breath, chest tightness, wheezing.  It would also be helpful to clear mucus from your chest when difficult to do so We will hold off on a repeat CT scan of the chest for now.  Consider doing so if you have progressive symptoms Follow Dr. Lamonte Sakai in 1 year or sooner if you have any problems.  Please call if you develop any new respiratory issues  Baltazar Apo, MD, PhD 09/10/2022, 3:35 PM [facility] [phone] or if no answer [phone]

## 2022-09-17 DIAGNOSIS — J3081 Allergic rhinitis due to animal (cat) (dog) hair and dander: Secondary | ICD-10-CM | POA: Diagnosis not present

## 2022-09-17 DIAGNOSIS — J301 Allergic rhinitis due to pollen: Secondary | ICD-10-CM | POA: Diagnosis not present

## 2022-09-17 DIAGNOSIS — J3089 Other allergic rhinitis: Secondary | ICD-10-CM | POA: Diagnosis not present

## 2022-09-24 DIAGNOSIS — J3081 Allergic rhinitis due to animal (cat) (dog) hair and dander: Secondary | ICD-10-CM | POA: Diagnosis not present

## 2022-09-24 DIAGNOSIS — J3089 Other allergic rhinitis: Secondary | ICD-10-CM | POA: Diagnosis not present

## 2022-09-24 DIAGNOSIS — J301 Allergic rhinitis due to pollen: Secondary | ICD-10-CM | POA: Diagnosis not present

## 2022-10-05 DIAGNOSIS — J3089 Other allergic rhinitis: Secondary | ICD-10-CM | POA: Diagnosis not present

## 2022-10-05 DIAGNOSIS — J3081 Allergic rhinitis due to animal (cat) (dog) hair and dander: Secondary | ICD-10-CM | POA: Diagnosis not present

## 2022-10-05 DIAGNOSIS — J301 Allergic rhinitis due to pollen: Secondary | ICD-10-CM | POA: Diagnosis not present

## 2022-10-14 DIAGNOSIS — J3081 Allergic rhinitis due to animal (cat) (dog) hair and dander: Secondary | ICD-10-CM | POA: Diagnosis not present

## 2022-10-14 DIAGNOSIS — J301 Allergic rhinitis due to pollen: Secondary | ICD-10-CM | POA: Diagnosis not present

## 2022-10-14 DIAGNOSIS — J3089 Other allergic rhinitis: Secondary | ICD-10-CM | POA: Diagnosis not present

## 2022-10-22 DIAGNOSIS — J3081 Allergic rhinitis due to animal (cat) (dog) hair and dander: Secondary | ICD-10-CM | POA: Diagnosis not present

## 2022-10-22 DIAGNOSIS — J3089 Other allergic rhinitis: Secondary | ICD-10-CM | POA: Diagnosis not present

## 2022-10-22 DIAGNOSIS — J301 Allergic rhinitis due to pollen: Secondary | ICD-10-CM | POA: Diagnosis not present

## 2022-10-26 DIAGNOSIS — J301 Allergic rhinitis due to pollen: Secondary | ICD-10-CM | POA: Diagnosis not present

## 2022-10-26 DIAGNOSIS — J3089 Other allergic rhinitis: Secondary | ICD-10-CM | POA: Diagnosis not present

## 2022-10-26 DIAGNOSIS — J3081 Allergic rhinitis due to animal (cat) (dog) hair and dander: Secondary | ICD-10-CM | POA: Diagnosis not present

## 2022-11-05 DIAGNOSIS — J301 Allergic rhinitis due to pollen: Secondary | ICD-10-CM | POA: Diagnosis not present

## 2022-11-05 DIAGNOSIS — J3089 Other allergic rhinitis: Secondary | ICD-10-CM | POA: Diagnosis not present

## 2022-11-05 DIAGNOSIS — J3081 Allergic rhinitis due to animal (cat) (dog) hair and dander: Secondary | ICD-10-CM | POA: Diagnosis not present

## 2022-11-12 DIAGNOSIS — J301 Allergic rhinitis due to pollen: Secondary | ICD-10-CM | POA: Diagnosis not present

## 2022-11-12 DIAGNOSIS — J3089 Other allergic rhinitis: Secondary | ICD-10-CM | POA: Diagnosis not present

## 2022-11-12 DIAGNOSIS — J3081 Allergic rhinitis due to animal (cat) (dog) hair and dander: Secondary | ICD-10-CM | POA: Diagnosis not present

## 2022-11-22 NOTE — Progress Notes (Unsigned)
ACUTE VISIT No chief complaint on file.  HPI: [name] is a 82 y.o. male, who is here today complaining of *** HPI  Review of Systems See other pertinent positives and negatives in HPI.  Current Outpatient Medications on File Prior to Visit  Medication Sig Dispense Refill   Acetaminophen (TYLENOL PO) Take by mouth.     albuterol (VENTOLIN HFA) 108 (90 Base) MCG/ACT inhaler Inhale 2 puffs into the lungs every 6 (six) hours as needed for wheezing or shortness of breath. 8 g 1   Avanafil 200 MG TABS Take 1 tablet by mouth once a week.     ciclopirox (LOPROX) 0.77 % cream Apply topically 2 (two) times daily. 15 g 1   Desoximetasone (TOPICORT) 0.25 % ointment Apply 1 application topically 2 (two) times daily. 30 g 2   doxycycline (VIBRA-TABS) 100 MG tablet Take 1 tablet (100 mg total) by mouth 2 (two) times daily. 14 tablet 0   EPINEPHrine 0.3 mg/0.3 mL IJ SOAJ injection Inject into the muscle.     escitalopram (LEXAPRO) 10 MG tablet Take 1 tablet (10 mg total) by mouth daily. 90 tablet 3   fexofenadine (ALLEGRA) 180 MG tablet Take 180 mg by mouth every morning.     fluticasone (FLONASE) 50 MCG/ACT nasal spray SPRAY TWO SPRAYS IN EACH NOSTRIL DAILY AS NEEDED 16 g 2   GuaiFENesin (MUCINEX PO) Take 1,200 mg by mouth 2 (two) times daily. Extended release     ibuprofen (ADVIL,MOTRIN) 200 MG tablet Take 200 mg by mouth every 6 (six) hours as needed.     mometasone (ELOCON) 0.1 % cream Apply topically.     Multiple Vitamins-Minerals (CENTRUM ADULTS PO) Take 1 tablet by mouth daily.     predniSONE (DELTASONE) 10 MG tablet Take 2 tablets (20 mg total) by mouth daily with breakfast. 10 tablet 0   Respiratory Therapy Supplies (FLUTTER) DEVI 1 Device by Does not apply route as needed. 1 each 0   rosuvastatin (CRESTOR) 10 MG tablet Take 1 tablet (10 mg total) by mouth daily. 90 tablet 3   No current facility-administered medications on file prior to visit.    Past Medical History:   Diagnosis Date   Allergic rhinitis    Arthritis    Bronchiectasis pulmologist-  dr Eric Brock ([place])--  per lov note 03-22-2016 stable   intermittant--  per pt last bout yr ago 2016    Eczema    Elevated PSA    History of colon polyps    1999   History of concussion    2005 and 2010 with mild cerebral bleed resolved -no residual   History of non-Hodgkin's lymphoma oncologist-  dr Eric Brock--  pt in remission   dx 03/ 2000  ,  Stage IA,  scalp/forehead ,  post chemo/radioation therapy   Hyperlipidemia    diet controlled, no med   Lower urinary tract symptoms (LUTS)    Prostate cancer [facility]) urologist-  dr Eric Brock  oncologist-  dr Eric Brock and dr Eric Brock   cT2b N0 M0, Gleason 4+3,  PSA 4.2,  vol 35.0cc--  plan IMRT w/ gold seeds and ADT   Wears glasses    Allergies  Allergen Reactions   Pollen Extract-Tree Extract [Pollen Extract]     Weed allergies also    Social History   Socioeconomic History   Marital status: Married    Spouse name: Not on file   Number of children: Not on file   Years of education: Not on  file   Highest education level: Associate degree: academic program  Occupational History   Occupation: retired    Associate Professor: RETIRED  Tobacco Use   Smoking status: Former    Packs/day: 1.00    Years: 12.00    Additional pack years: 0.00    Total pack years: 12.00    Types: Cigarettes    Start date: 1962    Quit date: 08/03/1975    Years since quitting: 47.3   Smokeless tobacco: Never  Vaping Use   Vaping Use: Never used  Substance and Sexual Activity   Alcohol use: Yes    Alcohol/week: 7.0 standard drinks of alcohol    Types: 7 Glasses of wine per week    Comment: 1 glass of wine daily   Drug use: No   Sexual activity: Yes  Other Topics Concern   Not on file  Social History Narrative   Right handed   Drinks caffeine   Two story home   Social Determinants of Health   Financial Resource Strain: Low Risk  (12/16/2021)   Overall Financial Resource Strain  (CARDIA)    Difficulty of Paying Living Expenses: Not hard at all  Food Insecurity: No Food Insecurity (12/16/2021)   Hunger Vital Sign    Worried About Running Out of Food in the Last Year: Never true    Ran Out of Food in the Last Year: Never true  Transportation Needs: No Transportation Needs (12/16/2021)   PRAPARE - Administrator, Civil Service (Medical): No    Lack of Transportation (Non-Medical): No  Physical Activity: Unknown (12/16/2021)   Exercise Vital Sign    Days of Exercise per Week: 0 days    Minutes of Exercise per Session: Not on file  Stress: Stress Concern Present (12/16/2021)   [employer] of Occupational Health - Occupational Stress Questionnaire    Feeling of Stress : To some extent  Social Connections: Moderately Integrated (12/16/2021)   Social Connection and Isolation Panel [NHANES]    Frequency of Communication with Friends and Family: More than three times a week    Frequency of Social Gatherings with Friends and Family: Once a week    Attends Religious Services: More than 4 times per year    Active Member of [employer] or Organizations: No    Attends Engineer, structural: Not on file    Marital Status: Married    There were no vitals filed for this visit. There is no height or weight on file to calculate BMI.  Physical Exam  ASSESSMENT AND PLAN: There are no diagnoses linked to this encounter.  No follow-ups on file.   G. [place], MD  [facility]. [place] office.  Discharge Instructions   None

## 2022-11-23 ENCOUNTER — Ambulatory Visit (INDEPENDENT_AMBULATORY_CARE_PROVIDER_SITE_OTHER): Payer: PPO | Admitting: Family Medicine

## 2022-11-23 ENCOUNTER — Encounter: Payer: Self-pay | Admitting: Family Medicine

## 2022-11-23 VITALS — BP 120/70 | HR 60 | Temp 98.1°F | Resp 16 | Ht 70.0 in | Wt 189.5 lb

## 2022-11-23 DIAGNOSIS — K219 Gastro-esophageal reflux disease without esophagitis: Secondary | ICD-10-CM | POA: Diagnosis not present

## 2022-11-23 DIAGNOSIS — R053 Chronic cough: Secondary | ICD-10-CM | POA: Diagnosis not present

## 2022-11-23 MED ORDER — OMEPRAZOLE 40 MG PO CPDR: 40.0000 mg | DELAYED_RELEASE_CAPSULE | Freq: Every day | ORAL | 0 refills | Status: DC

## 2022-11-23 NOTE — Patient Instructions (Addendum)
A few things to remember from today's visit:  Gastroesophageal reflux disease, unspecified whether esophagitis present - Plan: omeprazole (PRILOSEC) 40 MG capsule  Chronic cough  Omeprazole 40 mg 30 min before breakfast for 6-8 weeks then continue with 20 mg daily or every other day.  Do not use My Chart to request refills or for acute issues that need immediate attention. If you send a my chart message, it may take a few days to be addressed, specially if I am not in the office.  Please be sure medication list is accurate. If a new problem present, please set up appointment sooner than planned today.

## 2022-11-24 DIAGNOSIS — J479 Bronchiectasis, uncomplicated: Secondary | ICD-10-CM | POA: Diagnosis not present

## 2022-11-24 DIAGNOSIS — J3081 Allergic rhinitis due to animal (cat) (dog) hair and dander: Secondary | ICD-10-CM | POA: Diagnosis not present

## 2022-11-24 DIAGNOSIS — J3089 Other allergic rhinitis: Secondary | ICD-10-CM | POA: Diagnosis not present

## 2022-11-24 DIAGNOSIS — J301 Allergic rhinitis due to pollen: Secondary | ICD-10-CM | POA: Diagnosis not present

## 2022-11-24 DIAGNOSIS — H1045 Other chronic allergic conjunctivitis: Secondary | ICD-10-CM | POA: Diagnosis not present

## 2022-11-29 DIAGNOSIS — J3081 Allergic rhinitis due to animal (cat) (dog) hair and dander: Secondary | ICD-10-CM | POA: Diagnosis not present

## 2022-11-29 DIAGNOSIS — J3089 Other allergic rhinitis: Secondary | ICD-10-CM | POA: Diagnosis not present

## 2022-12-03 DIAGNOSIS — J3089 Other allergic rhinitis: Secondary | ICD-10-CM | POA: Diagnosis not present

## 2022-12-03 DIAGNOSIS — J301 Allergic rhinitis due to pollen: Secondary | ICD-10-CM | POA: Diagnosis not present

## 2022-12-03 DIAGNOSIS — J3081 Allergic rhinitis due to animal (cat) (dog) hair and dander: Secondary | ICD-10-CM | POA: Diagnosis not present

## 2022-12-04 ENCOUNTER — Other Ambulatory Visit: Payer: Self-pay | Admitting: Family Medicine

## 2022-12-08 DIAGNOSIS — J301 Allergic rhinitis due to pollen: Secondary | ICD-10-CM | POA: Diagnosis not present

## 2022-12-08 DIAGNOSIS — J3089 Other allergic rhinitis: Secondary | ICD-10-CM | POA: Diagnosis not present

## 2022-12-08 DIAGNOSIS — J3081 Allergic rhinitis due to animal (cat) (dog) hair and dander: Secondary | ICD-10-CM | POA: Diagnosis not present

## 2022-12-17 DIAGNOSIS — J3089 Other allergic rhinitis: Secondary | ICD-10-CM | POA: Diagnosis not present

## 2022-12-17 DIAGNOSIS — J3081 Allergic rhinitis due to animal (cat) (dog) hair and dander: Secondary | ICD-10-CM | POA: Diagnosis not present

## 2022-12-17 DIAGNOSIS — J301 Allergic rhinitis due to pollen: Secondary | ICD-10-CM | POA: Diagnosis not present

## 2022-12-24 DIAGNOSIS — J301 Allergic rhinitis due to pollen: Secondary | ICD-10-CM | POA: Diagnosis not present

## 2022-12-24 DIAGNOSIS — J3089 Other allergic rhinitis: Secondary | ICD-10-CM | POA: Diagnosis not present

## 2022-12-24 DIAGNOSIS — J3081 Allergic rhinitis due to animal (cat) (dog) hair and dander: Secondary | ICD-10-CM | POA: Diagnosis not present

## 2023-01-03 DIAGNOSIS — J301 Allergic rhinitis due to pollen: Secondary | ICD-10-CM | POA: Diagnosis not present

## 2023-01-03 DIAGNOSIS — J3081 Allergic rhinitis due to animal (cat) (dog) hair and dander: Secondary | ICD-10-CM | POA: Diagnosis not present

## 2023-01-03 DIAGNOSIS — J3089 Other allergic rhinitis: Secondary | ICD-10-CM | POA: Diagnosis not present

## 2023-01-12 DIAGNOSIS — J3081 Allergic rhinitis due to animal (cat) (dog) hair and dander: Secondary | ICD-10-CM | POA: Diagnosis not present

## 2023-01-12 DIAGNOSIS — J301 Allergic rhinitis due to pollen: Secondary | ICD-10-CM | POA: Diagnosis not present

## 2023-01-12 DIAGNOSIS — J3089 Other allergic rhinitis: Secondary | ICD-10-CM | POA: Diagnosis not present

## 2023-01-13 DIAGNOSIS — J301 Allergic rhinitis due to pollen: Secondary | ICD-10-CM | POA: Diagnosis not present

## 2023-01-17 ENCOUNTER — Other Ambulatory Visit: Payer: Self-pay | Admitting: Family Medicine

## 2023-01-17 ENCOUNTER — Encounter: Payer: Self-pay | Admitting: Family Medicine

## 2023-01-17 ENCOUNTER — Ambulatory Visit (INDEPENDENT_AMBULATORY_CARE_PROVIDER_SITE_OTHER): Payer: PPO | Admitting: Family Medicine

## 2023-01-17 VITALS — BP 106/60 | HR 80 | Temp 98.0°F | Ht 70.0 in | Wt 188.7 lb

## 2023-01-17 DIAGNOSIS — K219 Gastro-esophageal reflux disease without esophagitis: Secondary | ICD-10-CM

## 2023-01-17 DIAGNOSIS — Z79899 Other long term (current) drug therapy: Secondary | ICD-10-CM

## 2023-01-17 DIAGNOSIS — Z0000 Encounter for general adult medical examination without abnormal findings: Secondary | ICD-10-CM

## 2023-01-17 DIAGNOSIS — E785 Hyperlipidemia, unspecified: Secondary | ICD-10-CM

## 2023-01-17 DIAGNOSIS — R5383 Other fatigue: Secondary | ICD-10-CM | POA: Diagnosis not present

## 2023-01-17 LAB — HEPATIC FUNCTION PANEL
ALT: 25 U/L (ref 0–53)
AST: 22 U/L (ref 0–37)
Albumin: 4.4 g/dL (ref 3.5–5.2)
Alkaline Phosphatase: 53 U/L (ref 39–117)
Bilirubin, Direct: 0.1 mg/dL (ref 0.0–0.3)
Total Bilirubin: 0.5 mg/dL (ref 0.2–1.2)
Total Protein: 7 g/dL (ref 6.0–8.3)

## 2023-01-17 LAB — VITAMIN B12: Vitamin B-12: 602 pg/mL (ref 211–911)

## 2023-01-17 LAB — LIPID PANEL
Cholesterol: 116 mg/dL (ref 0–200)
HDL: 41.1 mg/dL (ref >39.00)
LDL Cholesterol: 58 mg/dL (ref 0–99)
NonHDL: 74.52
Total CHOL/HDL Ratio: 3
Triglycerides: 84 mg/dL (ref 0.0–149.0)
VLDL: 16.8 mg/dL (ref 0.0–40.0)

## 2023-01-17 LAB — CBC WITH DIFFERENTIAL/PLATELET
Basophils Absolute: 0.1 10*3/uL (ref 0.0–0.1)
Basophils Relative: 0.9 % (ref 0.0–3.0)
Eosinophils Absolute: 0.3 10*3/uL (ref 0.0–0.7)
Eosinophils Relative: 3.4 % (ref 0.0–5.0)
HCT: 43.5 % (ref 39.0–52.0)
Hemoglobin: 14.3 g/dL (ref 13.0–17.0)
Lymphocytes Relative: 20.9 % (ref 12.0–46.0)
Lymphs Abs: 1.7 10*3/uL (ref 0.7–4.0)
MCHC: 32.9 g/dL (ref 30.0–36.0)
MCV: 96 fl (ref 78.0–100.0)
Monocytes Absolute: 0.6 10*3/uL (ref 0.1–1.0)
Monocytes Relative: 6.9 % (ref 3.0–12.0)
Neutro Abs: 5.5 10*3/uL (ref 1.4–7.7)
Neutrophils Relative %: 67.9 % (ref 43.0–77.0)
Platelets: 219 10*3/uL (ref 150.0–400.0)
RBC: 4.54 Mil/uL (ref 4.22–5.81)
RDW: 13.4 % (ref 11.5–15.5)
WBC: 8.1 10*3/uL (ref 4.0–10.5)

## 2023-01-17 LAB — BASIC METABOLIC PANEL
BUN: 15 mg/dL (ref 6–23)
CO2: 28 mEq/L (ref 19–32)
Calcium: 9.3 mg/dL (ref 8.4–10.5)
Chloride: 104 mEq/L (ref 96–112)
Creatinine, Ser: 0.96 mg/dL (ref 0.40–1.50)
GFR: 74.94 mL/min (ref >60.00)
Glucose, Bld: 87 mg/dL (ref 70–99)
Potassium: 4.5 mEq/L (ref 3.5–5.1)
Sodium: 139 mEq/L (ref 135–145)

## 2023-01-17 MED ORDER — OMEPRAZOLE 40 MG PO CPDR: 40.0000 mg | DELAYED_RELEASE_CAPSULE | Freq: Every day | ORAL | 2 refills | Status: DC

## 2023-01-17 NOTE — Patient Instructions (Signed)
Consider Shingrix (shingles vaccine)  Continue with yearly flu vaccine.

## 2023-01-17 NOTE — Progress Notes (Signed)
Established Patient Office Visit  Subjective   Patient ID: Eric Brock, male    DOB: 23-Dec-1940  Age: 82 y.o. MRN: [id number]  Chief Complaint  Patient presents with   Medicare Wellness    HPI   Mr. Arnau is seen today for physical exam.  Remote history of prostate cancer.  He had multiple treatments with radiation and had 1 injection of hormone therapy but did not tolerate secondary to side effects.  Also past history of non-Hodgkin's lymphoma.  He quit smoking 1977.  Doing well at this time generally.  He does have some fatigue issues from time to time.  He recently had dental checkup and there was concern for GERD based on some teeth findings.  He was seen here and placed on Prilosec 40 mg daily.  Not having any active GERD symptoms.  Does frequently eat late at night and sometimes heavier foods such as peanut butter.  Denies any consistent dysphagia.  Health maintenance reviewed  Health Maintenance  Topic Date Due   Medicare Annual Wellness (AWV)  Never done   Zoster Vaccines- Shingrix (1 of 2) Never done   COVID-19 Vaccine (5 - 2023-24 season) 04/02/2022   INFLUENZA VACCINE  03/03/2023   DTaP/Tdap/Td (3 - Td or Tdap) 07/27/2026   Pneumonia Vaccine 29+ Years old  Completed   Hepatitis C Screening  Completed   HPV VACCINES  Aged Out   Colonoscopy  Discontinued  Has not had Shingrix but did have Zostavax.  Social history-married.  Retired from [employer] at age 75.  He has 2 sons and 4 grandchildren.  Quit smoking 1977.  No regular alcohol.  Family history-mother had a brain tumor.  Unknown type.  Father had coronary disease in his 13s.  Brother with prostate cancer.  His brother actually died recently of complications of metastatic prostate cancer.   Review of Systems  Constitutional:  Positive for malaise/fatigue. Negative for chills, fever and weight loss.  HENT:  Negative for hearing loss.   Eyes:  Negative for blurred vision and double vision.   Respiratory:  Negative for cough and shortness of breath.   Cardiovascular:  Negative for chest pain, palpitations and leg swelling.  Gastrointestinal:  Negative for abdominal pain, blood in stool, constipation and diarrhea.  Genitourinary:  Negative for dysuria.  Skin:  Negative for rash.  Neurological:  Negative for dizziness, speech change, seizures, loss of consciousness and headaches.  Psychiatric/Behavioral:  Negative for depression.       Objective:     BP 106/60 (BP Location: Left Arm, Patient Position: Sitting, Cuff Size: Normal)   Pulse 80   Temp 98 F (36.7 C) (Oral)   Ht 5\' 10"  (1.778 m)   Wt 188 lb 11.2 oz (85.6 kg)   SpO2 95%   BMI 27.08 kg/m  BP Readings from Last 3 Encounters:  01/17/23 106/60  11/23/22 120/70  09/10/22 120/68   Wt Readings from Last 3 Encounters:  01/17/23 188 lb 11.2 oz (85.6 kg)  11/23/22 189 lb 8 oz (86 kg)  09/10/22 194 lb 12.8 oz (88.4 kg)      Physical Exam Vitals reviewed.  Constitutional:      General: He is not in acute distress.    Appearance: He is well-developed.  HENT:     Head: Normocephalic and atraumatic.     Right Ear: External ear normal.     Left Ear: External ear normal.  Eyes:     Conjunctiva/sclera: Conjunctivae normal.  Pupils: Pupils are equal, round, and reactive to light.  Neck:     Thyroid: No thyromegaly.  Cardiovascular:     Rate and Rhythm: Normal rate and regular rhythm.     Heart sounds: Normal heart sounds. No murmur heard. Pulmonary:     Effort: No respiratory distress.     Breath sounds: No wheezing or rales.  Abdominal:     General: Bowel sounds are normal. There is no distension.     Palpations: Abdomen is soft.     Tenderness: There is no abdominal tenderness. There is no guarding or rebound.     Comments: Very small umbilical hernia which is soft and nontender  Musculoskeletal:     Cervical back: Normal range of motion and neck supple.     Right lower leg: No edema.     Left  lower leg: No edema.  Lymphadenopathy:     Cervical: No cervical adenopathy.  Skin:    Findings: No rash.  Neurological:     Mental Status: He is alert and oriented to person, place, and time.     Cranial Nerves: No cranial nerve deficit.      No results found for any visits on 01/17/23.    The ASCVD Risk score (Arnett DK, et al., 2019) failed to calculate for the following reasons:   The valid total cholesterol range is 130 to 320 mg/dL    Assessment & Plan:   #1 physical exam.  We discussed health maintenance issues including reminder for yearly flu vaccine and also consider Shingrix.  He had prior Zostavax.  Aged out of further colonoscopies.  No indication for further PSAs at this time given his age  #2 hyperlipidemia treated with rosuvastatin.  Check lipid and hepatic panel.  #3 GERD.  Discussed lifestyle modification.  Consider elevate head of bed 4 to 6 inches.  Avoid late night eating.  Refilled Prilosec 40 mg daily but consider coming off this in a couple months.     No follow-ups on file.    Evelena Peat, MD

## 2023-01-18 ENCOUNTER — Other Ambulatory Visit: Payer: Self-pay | Admitting: Family Medicine

## 2023-01-18 DIAGNOSIS — K219 Gastro-esophageal reflux disease without esophagitis: Secondary | ICD-10-CM

## 2023-01-20 DIAGNOSIS — J3081 Allergic rhinitis due to animal (cat) (dog) hair and dander: Secondary | ICD-10-CM | POA: Diagnosis not present

## 2023-01-20 DIAGNOSIS — J301 Allergic rhinitis due to pollen: Secondary | ICD-10-CM | POA: Diagnosis not present

## 2023-01-20 DIAGNOSIS — J3089 Other allergic rhinitis: Secondary | ICD-10-CM | POA: Diagnosis not present

## 2023-01-25 DIAGNOSIS — J3081 Allergic rhinitis due to animal (cat) (dog) hair and dander: Secondary | ICD-10-CM | POA: Diagnosis not present

## 2023-01-25 DIAGNOSIS — J3089 Other allergic rhinitis: Secondary | ICD-10-CM | POA: Diagnosis not present

## 2023-01-25 DIAGNOSIS — J301 Allergic rhinitis due to pollen: Secondary | ICD-10-CM | POA: Diagnosis not present

## 2023-02-02 DIAGNOSIS — J3089 Other allergic rhinitis: Secondary | ICD-10-CM | POA: Diagnosis not present

## 2023-02-02 DIAGNOSIS — J3081 Allergic rhinitis due to animal (cat) (dog) hair and dander: Secondary | ICD-10-CM | POA: Diagnosis not present

## 2023-02-02 DIAGNOSIS — J301 Allergic rhinitis due to pollen: Secondary | ICD-10-CM | POA: Diagnosis not present

## 2023-02-08 DIAGNOSIS — J3089 Other allergic rhinitis: Secondary | ICD-10-CM | POA: Diagnosis not present

## 2023-02-08 DIAGNOSIS — J3081 Allergic rhinitis due to animal (cat) (dog) hair and dander: Secondary | ICD-10-CM | POA: Diagnosis not present

## 2023-02-08 DIAGNOSIS — J301 Allergic rhinitis due to pollen: Secondary | ICD-10-CM | POA: Diagnosis not present

## 2023-02-17 DIAGNOSIS — J3081 Allergic rhinitis due to animal (cat) (dog) hair and dander: Secondary | ICD-10-CM | POA: Diagnosis not present

## 2023-02-17 DIAGNOSIS — J301 Allergic rhinitis due to pollen: Secondary | ICD-10-CM | POA: Diagnosis not present

## 2023-02-17 DIAGNOSIS — J3089 Other allergic rhinitis: Secondary | ICD-10-CM | POA: Diagnosis not present

## 2023-02-24 DIAGNOSIS — J301 Allergic rhinitis due to pollen: Secondary | ICD-10-CM | POA: Diagnosis not present

## 2023-02-24 DIAGNOSIS — J3081 Allergic rhinitis due to animal (cat) (dog) hair and dander: Secondary | ICD-10-CM | POA: Diagnosis not present

## 2023-02-24 DIAGNOSIS — J3089 Other allergic rhinitis: Secondary | ICD-10-CM | POA: Diagnosis not present

## 2023-03-03 DIAGNOSIS — J3089 Other allergic rhinitis: Secondary | ICD-10-CM | POA: Diagnosis not present

## 2023-03-03 DIAGNOSIS — J301 Allergic rhinitis due to pollen: Secondary | ICD-10-CM | POA: Diagnosis not present

## 2023-03-03 DIAGNOSIS — J3081 Allergic rhinitis due to animal (cat) (dog) hair and dander: Secondary | ICD-10-CM | POA: Diagnosis not present

## 2023-03-11 DIAGNOSIS — J3081 Allergic rhinitis due to animal (cat) (dog) hair and dander: Secondary | ICD-10-CM | POA: Diagnosis not present

## 2023-03-11 DIAGNOSIS — J3089 Other allergic rhinitis: Secondary | ICD-10-CM | POA: Diagnosis not present

## 2023-03-11 DIAGNOSIS — J301 Allergic rhinitis due to pollen: Secondary | ICD-10-CM | POA: Diagnosis not present

## 2023-03-18 DIAGNOSIS — J3081 Allergic rhinitis due to animal (cat) (dog) hair and dander: Secondary | ICD-10-CM | POA: Diagnosis not present

## 2023-03-18 DIAGNOSIS — J301 Allergic rhinitis due to pollen: Secondary | ICD-10-CM | POA: Diagnosis not present

## 2023-03-18 DIAGNOSIS — J3089 Other allergic rhinitis: Secondary | ICD-10-CM | POA: Diagnosis not present

## 2023-03-24 DIAGNOSIS — J3081 Allergic rhinitis due to animal (cat) (dog) hair and dander: Secondary | ICD-10-CM | POA: Diagnosis not present

## 2023-03-24 DIAGNOSIS — J301 Allergic rhinitis due to pollen: Secondary | ICD-10-CM | POA: Diagnosis not present

## 2023-03-24 DIAGNOSIS — J3089 Other allergic rhinitis: Secondary | ICD-10-CM | POA: Diagnosis not present

## 2023-04-01 DIAGNOSIS — J3081 Allergic rhinitis due to animal (cat) (dog) hair and dander: Secondary | ICD-10-CM | POA: Diagnosis not present

## 2023-04-01 DIAGNOSIS — J3089 Other allergic rhinitis: Secondary | ICD-10-CM | POA: Diagnosis not present

## 2023-04-01 DIAGNOSIS — J301 Allergic rhinitis due to pollen: Secondary | ICD-10-CM | POA: Diagnosis not present

## 2023-04-12 ENCOUNTER — Telehealth: Payer: Self-pay | Admitting: Emergency Medicine

## 2023-04-12 MED ORDER — DOXYCYCLINE HYCLATE 100 MG PO TABS
100.0000 mg | ORAL_TABLET | Freq: Two times a day (BID) | ORAL | 0 refills | Status: DC
Start: 2023-04-12 — End: 2023-08-29

## 2023-04-12 NOTE — Telephone Encounter (Signed)
Pt stated hsi sinus has been bothering him for about a week. Cough, slight fever, runny noise and white phlegm. Covid negative. Has been doing his allergy shots. LOV 09/10/22

## 2023-04-12 NOTE — Telephone Encounter (Signed)
Has been using IBU, advil cold/sinus, some cough suppression.  He continues to have colored mucus, more frequent and higher amount.  I think will be reasonable to treat him for a possible superimposed bacterial bronchitis.  I will send a prescription for doxycycline to his pharmacy.  He knows to call us if he worsens in any way

## 2023-04-12 NOTE — Telephone Encounter (Signed)
Patient states having symptoms of cough, mucus and runny nose. Covid 19 test negative. Pharmacy is [facility]. Patient phone number is [phone].

## 2023-05-06 DIAGNOSIS — J3089 Other allergic rhinitis: Secondary | ICD-10-CM | POA: Diagnosis not present

## 2023-05-06 DIAGNOSIS — J3081 Allergic rhinitis due to animal (cat) (dog) hair and dander: Secondary | ICD-10-CM | POA: Diagnosis not present

## 2023-05-06 DIAGNOSIS — J301 Allergic rhinitis due to pollen: Secondary | ICD-10-CM | POA: Diagnosis not present

## 2023-05-18 DIAGNOSIS — J3081 Allergic rhinitis due to animal (cat) (dog) hair and dander: Secondary | ICD-10-CM | POA: Diagnosis not present

## 2023-05-18 DIAGNOSIS — J3089 Other allergic rhinitis: Secondary | ICD-10-CM | POA: Diagnosis not present

## 2023-05-18 DIAGNOSIS — J301 Allergic rhinitis due to pollen: Secondary | ICD-10-CM | POA: Diagnosis not present

## 2023-05-30 DIAGNOSIS — J3081 Allergic rhinitis due to animal (cat) (dog) hair and dander: Secondary | ICD-10-CM | POA: Diagnosis not present

## 2023-05-30 DIAGNOSIS — J301 Allergic rhinitis due to pollen: Secondary | ICD-10-CM | POA: Diagnosis not present

## 2023-05-30 DIAGNOSIS — J3089 Other allergic rhinitis: Secondary | ICD-10-CM | POA: Diagnosis not present

## 2023-06-03 ENCOUNTER — Other Ambulatory Visit: Payer: Self-pay | Admitting: Family Medicine

## 2023-06-03 DIAGNOSIS — K219 Gastro-esophageal reflux disease without esophagitis: Secondary | ICD-10-CM

## 2023-06-07 DIAGNOSIS — J3081 Allergic rhinitis due to animal (cat) (dog) hair and dander: Secondary | ICD-10-CM | POA: Diagnosis not present

## 2023-06-07 DIAGNOSIS — J3089 Other allergic rhinitis: Secondary | ICD-10-CM | POA: Diagnosis not present

## 2023-06-07 DIAGNOSIS — J301 Allergic rhinitis due to pollen: Secondary | ICD-10-CM | POA: Diagnosis not present

## 2023-06-13 DIAGNOSIS — C61 Malignant neoplasm of prostate: Secondary | ICD-10-CM | POA: Diagnosis not present

## 2023-06-20 DIAGNOSIS — C61 Malignant neoplasm of prostate: Secondary | ICD-10-CM | POA: Diagnosis not present

## 2023-06-20 DIAGNOSIS — J3089 Other allergic rhinitis: Secondary | ICD-10-CM | POA: Diagnosis not present

## 2023-06-20 DIAGNOSIS — J301 Allergic rhinitis due to pollen: Secondary | ICD-10-CM | POA: Diagnosis not present

## 2023-06-20 DIAGNOSIS — J3081 Allergic rhinitis due to animal (cat) (dog) hair and dander: Secondary | ICD-10-CM | POA: Diagnosis not present

## 2023-06-22 ENCOUNTER — Ambulatory Visit: Payer: PPO | Admitting: Primary Care

## 2023-06-22 ENCOUNTER — Encounter: Payer: Self-pay | Admitting: Primary Care

## 2023-06-22 ENCOUNTER — Ambulatory Visit: Payer: PPO

## 2023-06-22 VITALS — BP 129/67 | HR 58 | Temp 97.9°F | Ht 70.0 in | Wt 187.4 lb

## 2023-06-22 DIAGNOSIS — J479 Bronchiectasis, uncomplicated: Secondary | ICD-10-CM | POA: Diagnosis not present

## 2023-06-22 DIAGNOSIS — J309 Allergic rhinitis, unspecified: Secondary | ICD-10-CM

## 2023-06-22 NOTE — Progress Notes (Signed)
@Patient  ID: Eric Brock, male    DOB: 1941-04-06, 82 y.o.   MRN: [id number]  Chief Complaint  Patient presents with   Follow-up    Referring provider: Kristian Covey, MD  HPI: 82 year old male, former smoker. PMH significant hyperlipidemia, bronchiectasis, chronic cough, chronic maxillary sinusitis, prostate cancer. Patient of Dr. Delton Coombes.   06/22/2023- Interim hx  Discussed the use of AI scribe software for clinical note transcription with the patient, who gave verbal consent to proceed.  History of Present Illness   The patient, with a history of bronchiectasis, last seen in February 2024, reports a recent exacerbation in September. He experienced a cough, slight fever, runny nose, and white phlegm. The patient was treated with doxycycline, which eventually resolved the symptoms, although the patient is unsure if the resolution was due to the antibiotic or the passage of time.  The patient continues to experience nightly sinus congestion and mucus production, which he manages with Allegra, Fluticasone nasal spray, allergy shots, and Mucinex twice daily. He also uses a flutter valve as needed. The patient describes the symptoms as more of a nuisance than a significant issue. The patient also has a history of sinus issues, for which he has seen an Ear, Nose, and Throat specialist. The patient manages these symptoms with Tylenol cold and sinus.  The patient's symptoms have not progressed since last year, and he has not experienced any shortness of breath, purulent mucus production or fever in the last couple of weeks. The patient's last chest CT scan was in 2020.      Allergies  Allergen Reactions   Pollen Extract-Tree Extract [Pollen Extract]     Weed allergies also    Immunization History  Administered Date(s) Administered   Fluad Quad(high Dose 65+) 05/06/2020, 04/08/2021, 04/15/2022   Fluad Trivalent(High Dose 65+) 04/22/2023   Influenza Split 05/03/2011, 04/04/2012,  04/02/2013, 04/02/2014   Influenza Whole 05/03/2007, 05/13/2010   Influenza, High Dose Seasonal PF 05/15/2015, 04/21/2016, 06/02/2017, 05/04/2018, 04/27/2019, 05/28/2019, 11/26/2019, 05/26/2020, 05/26/2021   Influenza,inj,Quad PF,6+ Mos 03/20/2014   PFIZER Comirnaty(Gray Top)Covid-19 Tri-Sucrose Vaccine 11/14/2020   PFIZER(Purple Top)SARS-COV-2 Vaccination 08/16/2019, 09/06/2019, 05/06/2020   Pfizer(Comirnaty)Fall Seasonal Vaccine 12 years and older 04/22/2023   Pneumococcal Conjugate-13 03/20/2014   Pneumococcal Polysaccharide-23 08/02/2002, 08/02/2005, 02/25/2010, 05/28/2019, 11/26/2019, 05/26/2021   Td 08/02/2005   Tdap 07/27/2016   Varicella 08/02/2009    Past Medical History:  Diagnosis Date   Allergic rhinitis    Arthritis    Bronchiectasis pulmologist-  dr Delton Coombes ([place])--  per lov note 03-22-2016 stable   intermittant--  per pt last bout yr ago 2016    Eczema    Elevated PSA    History of colon polyps    1999   History of concussion    2005 and 2010 with mild cerebral bleed resolved -no residual   History of non-Hodgkin's lymphoma oncologist-  dr Truett Perna--  pt in remission   dx 03/ 2000  ,  Stage IA,  scalp/forehead ,  post chemo/radioation therapy   Hyperlipidemia    diet controlled, no med   Lower urinary tract symptoms (LUTS)    Prostate cancer [facility]) urologist-  dr Ronne Binning  oncologist-  dr Clelia Croft and dr manning   cT2b N0 M0, Gleason 4+3,  PSA 4.2,  vol 35.0cc--  plan IMRT w/ gold seeds and ADT   Wears glasses     Tobacco History: Social History   Tobacco Use  Smoking Status Former   Current packs/day: 0.00  Average packs/day: 1 pack/day for 15.0 years (15.0 ttl pk-yrs)   Types: Cigarettes   Start date: 64   Quit date: 08/03/1975   Years since quitting: 47.9  Smokeless Tobacco Never   Counseling given: Not Answered   Outpatient Medications Prior to Visit  Medication Sig Dispense Refill   Acetaminophen (TYLENOL PO) Take by mouth.     albuterol  (VENTOLIN HFA) 108 (90 Base) MCG/ACT inhaler Inhale 2 puffs into the lungs every 6 (six) hours as needed for wheezing or shortness of breath. 8 g 1   Avanafil 200 MG TABS Take 1 tablet by mouth once a week.     ciclopirox (LOPROX) 0.77 % cream Apply topically 2 (two) times daily. 15 g 1   Desoximetasone (TOPICORT) 0.25 % ointment Apply 1 application topically 2 (two) times daily. 30 g 2   doxycycline (VIBRA-TABS) 100 MG tablet Take 1 tablet (100 mg total) by mouth 2 (two) times daily. 14 tablet 0   EPINEPHrine 0.3 mg/0.3 mL IJ SOAJ injection Inject into the muscle.     escitalopram (LEXAPRO) 10 MG tablet TAKE 1 TABLET BY MOUTH DAILY 90 tablet 1   fexofenadine (ALLEGRA) 180 MG tablet Take 180 mg by mouth every morning.     fluticasone (FLONASE) 50 MCG/ACT nasal spray SPRAY TWO SPRAYS IN EACH NOSTRIL DAILY AS NEEDED 16 g 2   GuaiFENesin (MUCINEX PO) Take 1,200 mg by mouth 2 (two) times daily. Extended release     ibuprofen (ADVIL,MOTRIN) 200 MG tablet Take 200 mg by mouth every 6 (six) hours as needed.     mometasone (ELOCON) 0.1 % cream Apply topically.     Multiple Vitamins-Minerals (CENTRUM ADULTS PO) Take 1 tablet by mouth daily.     omeprazole (PRILOSEC) 40 MG capsule TAKE 1 CAPSULE BY MOUTH DAILY 90 capsule 1   Respiratory Therapy Supplies (FLUTTER) DEVI 1 Device by Does not apply route as needed. 1 each 0   rosuvastatin (CRESTOR) 10 MG tablet TAKE 1 TABLET BY MOUTH DAILY 90 tablet 3   No facility-administered medications prior to visit.   Review of Systems  Review of Systems  Constitutional: Negative.   HENT:  Positive for congestion and postnasal drip.   Respiratory:  Negative for cough, chest tightness, shortness of breath and wheezing.    Physical Exam  BP 129/67 (BP Location: Left Arm, Patient Position: Sitting, Cuff Size: Normal)   Pulse (!) 58   Temp 97.9 F (36.6 C) (Oral)   Ht 5\' 10"  (1.778 m)   Wt 187 lb 6.4 oz (85 kg)   SpO2 94%   BMI 26.89 kg/m  Physical  Exam Constitutional:      General: He is not in acute distress.    Appearance: Normal appearance. He is not ill-appearing.  HENT:     Head: Normocephalic and atraumatic.     Mouth/Throat:     Mouth: Mucous membranes are moist.     Pharynx: Oropharynx is clear.  Cardiovascular:     Rate and Rhythm: Normal rate and regular rhythm.  Pulmonary:     Effort: Pulmonary effort is normal.     Breath sounds: Normal breath sounds.  Musculoskeletal:        General: Normal range of motion.     Cervical back: Neck supple.  Skin:    General: Skin is warm and dry.  Neurological:     General: No focal deficit present.     Mental Status: He is alert and oriented to person, place, and  time. Mental status is at baseline.  Psychiatric:        Mood and Affect: Mood normal.        Behavior: Behavior normal.        Thought Content: Thought content normal.        Judgment: Judgment normal.      Lab Results:  CBC    Component Value Date/Time   WBC 8.1 01/17/2023 1149   RBC 4.54 01/17/2023 1149   HGB 14.3 01/17/2023 1149   HCT 43.5 01/17/2023 1149   PLT 219.0 01/17/2023 1149   MCV 96.0 01/17/2023 1149   MCH 31.5 11/15/2021 1427   MCHC 32.9 01/17/2023 1149   RDW 13.4 01/17/2023 1149   LYMPHSABS 1.7 01/17/2023 1149   MONOABS 0.6 01/17/2023 1149   EOSABS 0.3 01/17/2023 1149   BASOSABS 0.1 01/17/2023 1149    BMET    Component Value Date/Time   NA 139 01/17/2023 1149   K 4.5 01/17/2023 1149   CL 104 01/17/2023 1149   CO2 28 01/17/2023 1149   GLUCOSE 87 01/17/2023 1149   BUN 15 01/17/2023 1149   CREATININE 0.96 01/17/2023 1149   CREATININE 1.18 07/11/2020 1453   CALCIUM 9.3 01/17/2023 1149   GFRNONAA >60 11/15/2021 1427   GFRAA 97 12/07/2007 1005    BNP No results found for: "BNP"  ProBNP No results found for: "PROBNP"  Imaging: No results found.   Assessment & Plan:   1. Bronchiectasis without acute exacerbation (HCC) - DG Chest 2 View; Future  Bronchiectasis Recent  exacerbation in September managed with doxycycline. Persistent sinus congestion and mucus production, managed with Allegra, Fluticasone nasal spray, allergy shots, and Mucinex. No current fever or colored sputum. No progression of symptoms from last year. -Continue current management with flutter valve, albuterol, Allegra, Fluticasone nasal spray, allergy shots, and Mucinex. -Get chest x-ray to assess current lung status, to be scheduled at patient's convenience.  Allergic Rhinitis/Chronic sinusitis  Managed with Allegra, Fluticasone nasal spray, and allergy shots. -Continue current management with Allegra, Fluticasone nasal spray, and allergy shots. -Consider future CT scan of sinuses if symptoms persist or worsen. -Discuss potential use of biologics (Dupixent or Fasenra) with Dr. Delton Coombes if flare-ups increase in frequency.  Follow-up 1 year with Dr. Delton Coombes  Monitor for increased frequency of bronchiectasis exacerbations. Return for chest x-ray at patient's convenience.      Glenford Bayley, NP 06/22/2023

## 2023-06-22 NOTE — Patient Instructions (Addendum)
  YOUR PLAN:  -BRONCHIECTASIS: Bronchiectasis is a condition where the airways in your lungs become damaged, leading to mucus build-up and frequent infections. You recently had an exacerbation in September, which was managed with doxycycline. You should continue your current management with the flutter valve, albuterol, Allegra, Fluticasone nasal spray, allergy shots, and Mucinex. We may consider a chest x-ray to assess your current lung status and a future CT scan of your sinuses if your symptoms persist or worsen. Additionally, we discussed the potential use of biologics like Dupixent or Harrington Challenger if your flare-ups increase in frequency.  -ALLERGIC RHINITIS: Allergic rhinitis is an allergic reaction that causes sneezing, congestion, and a runny nose. You are currently managing this with Allegra, Fluticasone nasal spray, and allergy shots. Continue with this management plan.  INSTRUCTIONS: Please monitor for any increase in the frequency of your bronchiectasis exacerbations. Schedule a chest x-ray at your convenience. If your symptoms persist or worsen, we may need to consider a CT scan of your sinuses. Follow up with Dr. Delton Coombes to discuss the potential use of biologics if your flare-ups become more frequent.  Orders: CXR (come in any time office is open, avoid lunch hours)  Follow-up 1 year with Dr. Delton Coombes or sooner if needed

## 2023-06-28 DIAGNOSIS — J301 Allergic rhinitis due to pollen: Secondary | ICD-10-CM | POA: Diagnosis not present

## 2023-06-28 DIAGNOSIS — J3089 Other allergic rhinitis: Secondary | ICD-10-CM | POA: Diagnosis not present

## 2023-06-28 DIAGNOSIS — J3081 Allergic rhinitis due to animal (cat) (dog) hair and dander: Secondary | ICD-10-CM | POA: Diagnosis not present

## 2023-07-07 DIAGNOSIS — J3089 Other allergic rhinitis: Secondary | ICD-10-CM | POA: Diagnosis not present

## 2023-07-07 DIAGNOSIS — J3081 Allergic rhinitis due to animal (cat) (dog) hair and dander: Secondary | ICD-10-CM | POA: Diagnosis not present

## 2023-07-07 DIAGNOSIS — J301 Allergic rhinitis due to pollen: Secondary | ICD-10-CM | POA: Diagnosis not present

## 2023-07-14 DIAGNOSIS — J3081 Allergic rhinitis due to animal (cat) (dog) hair and dander: Secondary | ICD-10-CM | POA: Diagnosis not present

## 2023-07-14 DIAGNOSIS — J301 Allergic rhinitis due to pollen: Secondary | ICD-10-CM | POA: Diagnosis not present

## 2023-07-14 DIAGNOSIS — J3089 Other allergic rhinitis: Secondary | ICD-10-CM | POA: Diagnosis not present

## 2023-07-19 DIAGNOSIS — J3081 Allergic rhinitis due to animal (cat) (dog) hair and dander: Secondary | ICD-10-CM | POA: Diagnosis not present

## 2023-07-19 DIAGNOSIS — J3089 Other allergic rhinitis: Secondary | ICD-10-CM | POA: Diagnosis not present

## 2023-08-09 DIAGNOSIS — J3089 Other allergic rhinitis: Secondary | ICD-10-CM | POA: Diagnosis not present

## 2023-08-09 DIAGNOSIS — J3081 Allergic rhinitis due to animal (cat) (dog) hair and dander: Secondary | ICD-10-CM | POA: Diagnosis not present

## 2023-08-09 DIAGNOSIS — J301 Allergic rhinitis due to pollen: Secondary | ICD-10-CM | POA: Diagnosis not present

## 2023-08-10 ENCOUNTER — Ambulatory Visit (INDEPENDENT_AMBULATORY_CARE_PROVIDER_SITE_OTHER): Payer: PPO

## 2023-08-10 ENCOUNTER — Other Ambulatory Visit: Payer: Self-pay | Admitting: Primary Care

## 2023-08-10 DIAGNOSIS — J479 Bronchiectasis, uncomplicated: Secondary | ICD-10-CM

## 2023-08-10 DIAGNOSIS — R918 Other nonspecific abnormal finding of lung field: Secondary | ICD-10-CM | POA: Diagnosis not present

## 2023-08-22 DIAGNOSIS — J3089 Other allergic rhinitis: Secondary | ICD-10-CM | POA: Diagnosis not present

## 2023-08-22 DIAGNOSIS — J3081 Allergic rhinitis due to animal (cat) (dog) hair and dander: Secondary | ICD-10-CM | POA: Diagnosis not present

## 2023-08-22 DIAGNOSIS — J301 Allergic rhinitis due to pollen: Secondary | ICD-10-CM | POA: Diagnosis not present

## 2023-08-22 NOTE — Progress Notes (Signed)
Please let patient know chest x-ray showed chronic findings consistent with known history of bronchiectasis.  No acute airspace disease.

## 2023-08-29 ENCOUNTER — Encounter: Payer: Self-pay | Admitting: Family Medicine

## 2023-08-29 ENCOUNTER — Ambulatory Visit (INDEPENDENT_AMBULATORY_CARE_PROVIDER_SITE_OTHER): Payer: PPO | Admitting: Family Medicine

## 2023-08-29 VITALS — BP 128/68 | HR 57 | Temp 97.7°F | Wt 189.1 lb

## 2023-08-29 DIAGNOSIS — R21 Rash and other nonspecific skin eruption: Secondary | ICD-10-CM

## 2023-08-29 DIAGNOSIS — E785 Hyperlipidemia, unspecified: Secondary | ICD-10-CM

## 2023-08-29 MED ORDER — MOMETASONE FUROATE 0.1 % EX CREA: TOPICAL_CREAM | Freq: Every day | CUTANEOUS | 1 refills | Status: DC

## 2023-08-29 NOTE — Progress Notes (Signed)
Established Patient Office Visit  Subjective   Patient ID: SHANDY VI, male    DOB: [birth year]  Age: 83 y.o. MRN: [id number]  Chief Complaint  Patient presents with   Rash    Patient complains of rash on left leg, x6 months    HPI   Zackariah is here with skin rash left lower lateral leg for the past 6 months or longer.  Occasional pruritus.  Erythematous base and nummular in appearance with somewhat scaly surface.  No other areas of skin rash.  Medical problems include history of bronchiectasis, osteoarthritis, history of prostate cancer, remote history of non-Hodgkin's lymphoma, hyperlipidemia.  Remains on rosuvastatin 10 mg daily.  Also takes Lexapro 10 mg daily.  Lipids were checked last June.  No myalgias.  Does have bronchiectasis followed by pulmonary.  Flu vaccine already given.  Has also had RSV and pneumonia vaccines are up-to-date.  Past Medical History:  Diagnosis Date   Allergic rhinitis    Arthritis    Bronchiectasis pulmologist-  dr Delton Coombes ([place])--  per lov note 03-22-2016 stable   intermittant--  per pt last bout yr ago 2016    Eczema    Elevated PSA    History of colon polyps    1999   History of concussion    2005 and 2010 with mild cerebral bleed resolved -no residual   History of non-Hodgkin's lymphoma oncologist-  dr Truett Perna--  pt in remission   dx 03/ 2000  ,  Stage IA,  scalp/forehead ,  post chemo/radioation therapy   Hyperlipidemia    diet controlled, no med   Lower urinary tract symptoms (LUTS)    Prostate cancer [facility]) urologist-  dr Ronne Binning  oncologist-  dr Clelia Croft and dr manning   cT2b N0 M0, Gleason 4+3,  PSA 4.2,  vol 35.0cc--  plan IMRT w/ gold seeds and ADT   Wears glasses    Past Surgical History:  Procedure Laterality Date   CATARACT EXTRACTION W/ INTRAOCULAR LENS  IMPLANT, BILATERAL  2013   CHOLECYSTECTOMY  1994   COLONOSCOPY  last one 07-14-2015   GOLD SEED IMPLANT N/A 04/16/2016   Procedure: GOLD SEED IMPLANT;  Surgeon:  Malen Gauze, MD;  Location: [facility];  Service: Urology;  Laterality: N/A;   PROSTATE BIOPSY N/A 01/12/2016   Procedure: BIOPSY TRANSRECTAL ULTRASONIC PROSTATE (TUBP);  Surgeon: Malen Gauze, MD;  Location: [facility];  Service: Urology;  Laterality: N/A;   UMBILICAL HERNIA REPAIR  1992    reports that he quit smoking about 48 years ago. His smoking use included cigarettes. He started smoking about 63 years ago. He has a 15 pack-year smoking history. He has never used smokeless tobacco. He reports current alcohol use of about 7.0 standard drinks of alcohol per week. He reports that he does not use drugs. family history includes Brain cancer in his mother; Cancer in his brother; Heart disease (age of onset: 74) in his father. Allergies  Allergen Reactions   Pollen Extract-Tree Extract [Pollen Extract]     Weed allergies also    Review of Systems  Constitutional:  Negative for chills and fever.  Cardiovascular:  Negative for chest pain.  Skin:  Positive for rash.      Objective:     BP 128/68 (BP Location: Left Arm, Cuff Size: Normal)   Pulse (!) 57   Temp 97.7 F (36.5 C) (Oral)   Wt 189 lb 1.6 oz (85.8 kg)   SpO2 97%  BMI 27.13 kg/m  BP Readings from Last 3 Encounters:  08/29/23 128/68  06/22/23 129/67  01/17/23 106/60   Wt Readings from Last 3 Encounters:  08/29/23 189 lb 1.6 oz (85.8 kg)  06/22/23 187 lb 6.4 oz (85 kg)  01/17/23 188 lb 11.2 oz (85.6 kg)      Physical Exam Vitals reviewed.  Constitutional:      General: He is not in acute distress.    Appearance: He is not ill-appearing.  Cardiovascular:     Rate and Rhythm: Normal rate and regular rhythm.  Pulmonary:     Effort: Pulmonary effort is normal.     Breath sounds: Normal breath sounds.  Skin:    Findings: Rash present.     Comments: Has a couple small areas of rash left lateral leg.  Somewhat oval to nummular in shape and erythematous base with slightly  scaly surface.  Both of these are approximately 1-1/2 x 1 and half centimeters.  No elevated border.  Neurological:     Mental Status: He is alert.      No results found for any visits on 08/29/23.    The ASCVD Risk score (Arnett DK, et al., 2019) failed to calculate for the following reasons:   The 2019 ASCVD risk score is only valid for ages 16 to 38    Assessment & Plan:   #1 skin rash left leg.  Question nummular eczema.  Recommend Elocon 0.1% cream once daily.  Be in touch if this is not improving over the next couple weeks  #2 elevated blood pressure.  Initial blood pressure 150/68.  Repeat after rest came back to normal.  Observe  #3 hyperlipidemia treated with rosuvastatin 10 mg daily.  Lipids fairly well-controlled by labs in June. Evelena Peat, MD

## 2023-09-02 DIAGNOSIS — J3081 Allergic rhinitis due to animal (cat) (dog) hair and dander: Secondary | ICD-10-CM | POA: Diagnosis not present

## 2023-09-02 DIAGNOSIS — J301 Allergic rhinitis due to pollen: Secondary | ICD-10-CM | POA: Diagnosis not present

## 2023-09-02 DIAGNOSIS — J3089 Other allergic rhinitis: Secondary | ICD-10-CM | POA: Diagnosis not present

## 2023-09-07 DIAGNOSIS — J301 Allergic rhinitis due to pollen: Secondary | ICD-10-CM | POA: Diagnosis not present

## 2023-09-07 DIAGNOSIS — J3089 Other allergic rhinitis: Secondary | ICD-10-CM | POA: Diagnosis not present

## 2023-09-07 DIAGNOSIS — J3081 Allergic rhinitis due to animal (cat) (dog) hair and dander: Secondary | ICD-10-CM | POA: Diagnosis not present

## 2023-09-15 ENCOUNTER — Ambulatory Visit: Payer: PPO | Admitting: Emergency Medicine

## 2023-09-15 ENCOUNTER — Encounter: Payer: Self-pay | Admitting: Emergency Medicine

## 2023-09-15 VITALS — BP 124/70 | HR 80 | Ht 70.0 in | Wt 186.2 lb

## 2023-09-15 DIAGNOSIS — J479 Bronchiectasis, uncomplicated: Secondary | ICD-10-CM

## 2023-09-15 DIAGNOSIS — J301 Allergic rhinitis due to pollen: Secondary | ICD-10-CM | POA: Diagnosis not present

## 2023-09-15 NOTE — Patient Instructions (Addendum)
It was great to see you today. Please continue your allergy shots with Dr. [place] Callas as you have been doing We reviewed your chest x-ray today and it was stable.  Good news.  We will hold off on performing a CT scan of your chest for now.  We would do so if you have progressive clinical symptoms. Keep your Allegra and your nasal steroid spray available to use if needed.  If you have increasing congestion, drainage that leads to more cough then it would be reasonable to start taking these medicines every day on a schedule. Continue your Mucinex Follow Dr. Delton Coombes in 1 year.  Please call if you develop any respiratory symptoms so we can troubleshoot.

## 2023-09-15 NOTE — Assessment & Plan Note (Addendum)
He is doing great with his immunotherapy with Dr. [place] Callas.  He has been able to change his Allegra and nasal steroid as needed.  We did talk about changing these back to daily scheduled medications if his symptoms progress in any way.  I do not think there is any role to discuss possible Biologics at this time

## 2023-09-15 NOTE — Progress Notes (Signed)
   Subjective:    Patient ID: EMMANUELLE COXE, male    DOB: 10-28-1940, 83 y.o.   MRN: [id number]  HPI  ROV 09/10/2022 --83 year old male with history of non-Hodgkin's lymphoma and prostate cancer.  Followed for bronchiectasis and severe allergic rhinitis with associated chronic cough.  He was treated for pneumonia/bronchitis in late October 2023. He improved, no more blood, does cough some - clear mucous. Rarely needs a flutter. Remains on immunotherapy. On allegra, flonase prn. Never needs albuterol. Averages 1 flare a year.   ROV 09/15/2023 --Mr. Kreis is 83 years old with history of non-Hodgkin's lymphoma, prostate cancer.  He is followed here for severe allergic rhinitis and bronchiectasis with associated chronic cough.  He is followed by Dr. Franklin Callas on immunotherapy which has helped him significantly.  He was treated for a flare and possible bronchitis back in September 2024.  Sinus CT was considered but he improved and this was deferred. Today he reports that he is improved. He is working to keep his sinuses cleared, sometimes uses a decongestant. He is using allegra and nasal steroid only prn. He is reliable w his mucinex. Makes clear sputum. Does not need a flutter. He does still get a lot of nasal drainage - clear.   He has had flu, covid booster, RSV.   CXR 08/10/23 reviewed, shows stable chronic changes especially in the right middle lobe and lingula without any new infiltrates   Review of Systems As per HPI      Objective:   Physical Exam Vitals:   09/15/23 0845  BP: 124/70  Pulse: 80  SpO2: 97%  Weight: 186 lb 3.2 oz (84.5 kg)  Height: 5\' 10"  (1.778 m)    Gen: Pleasant, well-nourished, in no distress,  normal affect  ENT: No lesions,  mouth clear,  oropharynx clear, no postnasal drip  Neck: No JVD, no stridor  Lungs: No use of accessory muscles, no crackles or wheezing on normal respiration, no wheeze on forced expiration  Cardiovascular: RRR, heart sounds normal, no  murmur or gallops, no peripheral edema  Musculoskeletal: No deformities, no cyanosis or clubbing  Neuro: alert, awake, non focal  Skin: Warm, no lesions or rash       Assessment & Plan:  BRONCHIECTASIS Overall stable.  He did have a flare that had to be treated with antibiotics back in September 2024.  Chest x-ray now is stable.  We talked about the pros and cons of repeating his CT scan of the chest.  He would like to defer for now.  Will do so if he develops more symptoms or instability.    Allergic rhinitis He is doing great with his immunotherapy with Dr. [place] Callas.  He has been able to change his Allegra and nasal steroid as needed.  We did talk about changing these back to daily scheduled medications if his symptoms progress in any way.  I do not think there is any role to discuss possible Biologics at this time   Levy Pupa, MD, PhD 09/15/2023, 9:04 AM [facility] [phone] or if no answer [phone]

## 2023-09-15 NOTE — Assessment & Plan Note (Signed)
Overall stable.  He did have a flare that had to be treated with antibiotics back in September 2024.  Chest x-ray now is stable.  We talked about the pros and cons of repeating his CT scan of the chest.  He would like to defer for now.  Will do so if he develops more symptoms or instability.

## 2023-09-21 DIAGNOSIS — J3089 Other allergic rhinitis: Secondary | ICD-10-CM | POA: Diagnosis not present

## 2023-09-21 DIAGNOSIS — J301 Allergic rhinitis due to pollen: Secondary | ICD-10-CM | POA: Diagnosis not present

## 2023-09-21 DIAGNOSIS — J3081 Allergic rhinitis due to animal (cat) (dog) hair and dander: Secondary | ICD-10-CM | POA: Diagnosis not present

## 2023-10-05 DIAGNOSIS — J3089 Other allergic rhinitis: Secondary | ICD-10-CM | POA: Diagnosis not present

## 2023-10-05 DIAGNOSIS — J3081 Allergic rhinitis due to animal (cat) (dog) hair and dander: Secondary | ICD-10-CM | POA: Diagnosis not present

## 2023-10-05 DIAGNOSIS — J301 Allergic rhinitis due to pollen: Secondary | ICD-10-CM | POA: Diagnosis not present

## 2023-10-12 DIAGNOSIS — J3089 Other allergic rhinitis: Secondary | ICD-10-CM | POA: Diagnosis not present

## 2023-10-12 DIAGNOSIS — J301 Allergic rhinitis due to pollen: Secondary | ICD-10-CM | POA: Diagnosis not present

## 2023-10-12 DIAGNOSIS — J3081 Allergic rhinitis due to animal (cat) (dog) hair and dander: Secondary | ICD-10-CM | POA: Diagnosis not present

## 2023-10-21 DIAGNOSIS — J3089 Other allergic rhinitis: Secondary | ICD-10-CM | POA: Diagnosis not present

## 2023-10-21 DIAGNOSIS — J3081 Allergic rhinitis due to animal (cat) (dog) hair and dander: Secondary | ICD-10-CM | POA: Diagnosis not present

## 2023-10-21 DIAGNOSIS — J301 Allergic rhinitis due to pollen: Secondary | ICD-10-CM | POA: Diagnosis not present

## 2023-10-28 DIAGNOSIS — J301 Allergic rhinitis due to pollen: Secondary | ICD-10-CM | POA: Diagnosis not present

## 2023-10-28 DIAGNOSIS — J3089 Other allergic rhinitis: Secondary | ICD-10-CM | POA: Diagnosis not present

## 2023-10-28 DIAGNOSIS — J3081 Allergic rhinitis due to animal (cat) (dog) hair and dander: Secondary | ICD-10-CM | POA: Diagnosis not present

## 2023-11-07 DIAGNOSIS — J3081 Allergic rhinitis due to animal (cat) (dog) hair and dander: Secondary | ICD-10-CM | POA: Diagnosis not present

## 2023-11-07 DIAGNOSIS — J3089 Other allergic rhinitis: Secondary | ICD-10-CM | POA: Diagnosis not present

## 2023-11-07 DIAGNOSIS — J301 Allergic rhinitis due to pollen: Secondary | ICD-10-CM | POA: Diagnosis not present

## 2023-11-23 DIAGNOSIS — J301 Allergic rhinitis due to pollen: Secondary | ICD-10-CM | POA: Diagnosis not present

## 2023-11-23 DIAGNOSIS — J3089 Other allergic rhinitis: Secondary | ICD-10-CM | POA: Diagnosis not present

## 2023-11-23 DIAGNOSIS — J3081 Allergic rhinitis due to animal (cat) (dog) hair and dander: Secondary | ICD-10-CM | POA: Diagnosis not present

## 2023-12-02 DIAGNOSIS — J3089 Other allergic rhinitis: Secondary | ICD-10-CM | POA: Diagnosis not present

## 2023-12-02 DIAGNOSIS — J3081 Allergic rhinitis due to animal (cat) (dog) hair and dander: Secondary | ICD-10-CM | POA: Diagnosis not present

## 2023-12-02 DIAGNOSIS — J301 Allergic rhinitis due to pollen: Secondary | ICD-10-CM | POA: Diagnosis not present

## 2023-12-07 DIAGNOSIS — J3081 Allergic rhinitis due to animal (cat) (dog) hair and dander: Secondary | ICD-10-CM | POA: Diagnosis not present

## 2023-12-07 DIAGNOSIS — J3089 Other allergic rhinitis: Secondary | ICD-10-CM | POA: Diagnosis not present

## 2023-12-07 DIAGNOSIS — J301 Allergic rhinitis due to pollen: Secondary | ICD-10-CM | POA: Diagnosis not present

## 2023-12-11 ENCOUNTER — Other Ambulatory Visit: Payer: Self-pay | Admitting: Family Medicine

## 2023-12-13 DIAGNOSIS — J301 Allergic rhinitis due to pollen: Secondary | ICD-10-CM | POA: Diagnosis not present

## 2023-12-13 DIAGNOSIS — J3081 Allergic rhinitis due to animal (cat) (dog) hair and dander: Secondary | ICD-10-CM | POA: Diagnosis not present

## 2023-12-13 DIAGNOSIS — J3089 Other allergic rhinitis: Secondary | ICD-10-CM | POA: Diagnosis not present

## 2023-12-17 ENCOUNTER — Other Ambulatory Visit: Payer: Self-pay | Admitting: Family Medicine

## 2023-12-17 DIAGNOSIS — K219 Gastro-esophageal reflux disease without esophagitis: Secondary | ICD-10-CM

## 2023-12-21 DIAGNOSIS — J3081 Allergic rhinitis due to animal (cat) (dog) hair and dander: Secondary | ICD-10-CM | POA: Diagnosis not present

## 2023-12-21 DIAGNOSIS — J479 Bronchiectasis, uncomplicated: Secondary | ICD-10-CM | POA: Diagnosis not present

## 2023-12-21 DIAGNOSIS — H1045 Other chronic allergic conjunctivitis: Secondary | ICD-10-CM | POA: Diagnosis not present

## 2023-12-21 DIAGNOSIS — J301 Allergic rhinitis due to pollen: Secondary | ICD-10-CM | POA: Diagnosis not present

## 2023-12-30 DIAGNOSIS — J3089 Other allergic rhinitis: Secondary | ICD-10-CM | POA: Diagnosis not present

## 2023-12-30 DIAGNOSIS — J301 Allergic rhinitis due to pollen: Secondary | ICD-10-CM | POA: Diagnosis not present

## 2023-12-30 DIAGNOSIS — J3081 Allergic rhinitis due to animal (cat) (dog) hair and dander: Secondary | ICD-10-CM | POA: Diagnosis not present

## 2024-01-06 DIAGNOSIS — J3081 Allergic rhinitis due to animal (cat) (dog) hair and dander: Secondary | ICD-10-CM | POA: Diagnosis not present

## 2024-01-06 DIAGNOSIS — J3089 Other allergic rhinitis: Secondary | ICD-10-CM | POA: Diagnosis not present

## 2024-01-06 DIAGNOSIS — J301 Allergic rhinitis due to pollen: Secondary | ICD-10-CM | POA: Diagnosis not present

## 2024-01-12 DIAGNOSIS — J3081 Allergic rhinitis due to animal (cat) (dog) hair and dander: Secondary | ICD-10-CM | POA: Diagnosis not present

## 2024-01-12 DIAGNOSIS — J3089 Other allergic rhinitis: Secondary | ICD-10-CM | POA: Diagnosis not present

## 2024-01-12 DIAGNOSIS — J301 Allergic rhinitis due to pollen: Secondary | ICD-10-CM | POA: Diagnosis not present

## 2024-01-15 ENCOUNTER — Other Ambulatory Visit: Payer: Self-pay | Admitting: Family Medicine

## 2024-01-23 DIAGNOSIS — J3081 Allergic rhinitis due to animal (cat) (dog) hair and dander: Secondary | ICD-10-CM | POA: Diagnosis not present

## 2024-01-23 DIAGNOSIS — J301 Allergic rhinitis due to pollen: Secondary | ICD-10-CM | POA: Diagnosis not present

## 2024-01-23 DIAGNOSIS — J3089 Other allergic rhinitis: Secondary | ICD-10-CM | POA: Diagnosis not present

## 2024-02-02 DIAGNOSIS — J301 Allergic rhinitis due to pollen: Secondary | ICD-10-CM | POA: Diagnosis not present

## 2024-02-02 DIAGNOSIS — J3081 Allergic rhinitis due to animal (cat) (dog) hair and dander: Secondary | ICD-10-CM | POA: Diagnosis not present

## 2024-02-02 DIAGNOSIS — J3089 Other allergic rhinitis: Secondary | ICD-10-CM | POA: Diagnosis not present

## 2024-02-08 DIAGNOSIS — J301 Allergic rhinitis due to pollen: Secondary | ICD-10-CM | POA: Diagnosis not present

## 2024-02-10 DIAGNOSIS — J301 Allergic rhinitis due to pollen: Secondary | ICD-10-CM | POA: Diagnosis not present

## 2024-02-10 DIAGNOSIS — J3081 Allergic rhinitis due to animal (cat) (dog) hair and dander: Secondary | ICD-10-CM | POA: Diagnosis not present

## 2024-02-10 DIAGNOSIS — J3089 Other allergic rhinitis: Secondary | ICD-10-CM | POA: Diagnosis not present

## 2024-02-16 DIAGNOSIS — J3089 Other allergic rhinitis: Secondary | ICD-10-CM | POA: Diagnosis not present

## 2024-02-16 DIAGNOSIS — J3081 Allergic rhinitis due to animal (cat) (dog) hair and dander: Secondary | ICD-10-CM | POA: Diagnosis not present

## 2024-02-16 DIAGNOSIS — J301 Allergic rhinitis due to pollen: Secondary | ICD-10-CM | POA: Diagnosis not present

## 2024-02-24 DIAGNOSIS — J3081 Allergic rhinitis due to animal (cat) (dog) hair and dander: Secondary | ICD-10-CM | POA: Diagnosis not present

## 2024-02-24 DIAGNOSIS — J301 Allergic rhinitis due to pollen: Secondary | ICD-10-CM | POA: Diagnosis not present

## 2024-02-24 DIAGNOSIS — J3089 Other allergic rhinitis: Secondary | ICD-10-CM | POA: Diagnosis not present

## 2024-03-02 DIAGNOSIS — J301 Allergic rhinitis due to pollen: Secondary | ICD-10-CM | POA: Diagnosis not present

## 2024-03-02 DIAGNOSIS — J3081 Allergic rhinitis due to animal (cat) (dog) hair and dander: Secondary | ICD-10-CM | POA: Diagnosis not present

## 2024-03-02 DIAGNOSIS — J3089 Other allergic rhinitis: Secondary | ICD-10-CM | POA: Diagnosis not present

## 2024-03-09 DIAGNOSIS — J3089 Other allergic rhinitis: Secondary | ICD-10-CM | POA: Diagnosis not present

## 2024-03-09 DIAGNOSIS — J301 Allergic rhinitis due to pollen: Secondary | ICD-10-CM | POA: Diagnosis not present

## 2024-03-09 DIAGNOSIS — J3081 Allergic rhinitis due to animal (cat) (dog) hair and dander: Secondary | ICD-10-CM | POA: Diagnosis not present

## 2024-03-16 DIAGNOSIS — J3081 Allergic rhinitis due to animal (cat) (dog) hair and dander: Secondary | ICD-10-CM | POA: Diagnosis not present

## 2024-03-16 DIAGNOSIS — J3089 Other allergic rhinitis: Secondary | ICD-10-CM | POA: Diagnosis not present

## 2024-03-16 DIAGNOSIS — J301 Allergic rhinitis due to pollen: Secondary | ICD-10-CM | POA: Diagnosis not present

## 2024-03-23 DIAGNOSIS — J3081 Allergic rhinitis due to animal (cat) (dog) hair and dander: Secondary | ICD-10-CM | POA: Diagnosis not present

## 2024-03-23 DIAGNOSIS — J3089 Other allergic rhinitis: Secondary | ICD-10-CM | POA: Diagnosis not present

## 2024-03-23 DIAGNOSIS — J301 Allergic rhinitis due to pollen: Secondary | ICD-10-CM | POA: Diagnosis not present

## 2024-03-27 ENCOUNTER — Ambulatory Visit: Payer: Self-pay | Admitting: Emergency Medicine

## 2024-03-27 NOTE — Telephone Encounter (Signed)
 Pt scheduled acute ov 8/27 with Dr. Theophilus

## 2024-03-27 NOTE — Telephone Encounter (Signed)
 FYI Only or Action Required?: FYI only for provider.  Patient is followed in Pulmonology for bronchiectasis, last seen on 09/15/2023 by Eric Lamar RAMAN, MD.  Called Nurse Triage reporting Cough.  Symptoms began several days ago.  Interventions attempted: OTC medications: advil cold/sinus.  Symptoms are: stable.  Triage Disposition: See Physician Within 24 Hours  Patient/caregiver understands and will follow disposition?: Yes      Copied from CRM #[id number]. Topic: Clinical - Red Word Triage >> Mar 27, 2024  8:31 AM [name] wrote: Red Word that prompted transfer to Nurse Triage:   History of bronchiectasis Pt of Dr Eric   Having symptoms for 2 days:  Wet cough Feels sinuses are inflamed Phlegm is clear to slightly green No fever, headache, no blood when coughing No wheezing. Reason for Disposition  [1] Known COPD or other severe lung disease (i.e., bronchiectasis, cystic fibrosis, lung surgery) AND [2] symptoms getting worse (i.e., increased sputum purulence or amount, increased breathing difficulty  Answer Assessment - Initial Assessment Questions E2C2 Pulmonary Triage - Initial Assessment Questions Chief Complaint (e.g., cough, sob, wheezing, fever, chills, sweat or additional symptoms) *Go to specific symptom protocol after initial questions. Productive thick clear/green cough Endorses last flare was 1 year ago - usually prescribed abx  How long have symptoms been present? 2 days ago  Have you tested for COVID or Flu? Note: If not, ask patient if a home test can be taken. If so, instruct patient to call back for positive results. No  MEDICINES:   Have you used any OTC meds to help with symptoms? Yes If yes, ask What medications? Advil cold and sinus - does provide relief Endorses using afrin nasal spray  Have you used your inhalers/maintenance medication? No If yes, What medications? denies  If inhaler, ask How many puffs and how often? Note:  Review instructions on medication in the chart. N/a [facility] reviewed/reinforced albuterol  usage and SIG - endorses INH may be expired   OXYGEN: Do you wear supplemental oxygen? No If yes, How many liters are you supposed to use? N/a  Do you monitor your oxygen levels? No If yes, What is your reading (oxygen level) today? N/a  What is your usual oxygen saturation reading?  (Note: Pulmonary O2 sats should be 90% or greater) 95 at PCP office      1. ONSET: When did the cough begin?      See above 2. SEVERITY: How bad is the cough today?      See above 3. SPUTUM: Describe the color of your sputum (e.g., none, dry cough; clear, white, yellow, green)     See above 4. HEMOPTYSIS: Are you coughing up any blood? If Yes, ask: How much? (e.g., flecks, streaks, tablespoons, etc.)     Denies  5. DIFFICULTY BREATHING: Are you having difficulty breathing? If Yes, ask: How bad is it? (e.g., mild, moderate, severe)      denies 6. FEVER: Do you have a fever? If Yes, ask: What is your temperature, how was it measured, and when did it start?     denies 7. CARDIAC HISTORY: Do you have any history of heart disease? (e.g., heart attack, congestive heart failure)      denies 8. LUNG HISTORY: Do you have any history of lung disease?  (e.g., pulmonary embolus, asthma, emphysema)     bronchiectasis 9. PE RISK FACTORS: Do you have a history of blood clots? (or: recent major surgery, recent prolonged travel, bedridden)     denies 10.  OTHER SYMPTOMS: Do you have any other symptoms? (e.g., runny nose, wheezing, chest pain)       Runny nose and wheezing Triager does not appreciate audible SOB/wheezing during call. Pt is speaking in full sentences.  11. PREGNANCY: Is there any chance you are pregnant? When was your last menstrual period?       N/a 12. TRAVEL: Have you traveled out of the country in the last month? (e.g., travel history, exposures)        N/a  Protocols used: Cough - Acute Productive-A-AH

## 2024-03-28 ENCOUNTER — Ambulatory Visit (INDEPENDENT_AMBULATORY_CARE_PROVIDER_SITE_OTHER): Admitting: Pulmonary Disease

## 2024-03-28 ENCOUNTER — Encounter: Payer: Self-pay | Admitting: Pulmonary Disease

## 2024-03-28 VITALS — BP 130/86 | HR 51 | Temp 98.5°F | Ht 70.0 in | Wt 187.0 lb

## 2024-03-28 DIAGNOSIS — J471 Bronchiectasis with (acute) exacerbation: Secondary | ICD-10-CM | POA: Diagnosis not present

## 2024-03-28 MED ORDER — DOXYCYCLINE HYCLATE 100 MG PO TABS: 100.0000 mg | ORAL_TABLET | Freq: Two times a day (BID) | ORAL | 0 refills | Status: DC

## 2024-03-28 NOTE — Progress Notes (Unsigned)
 Eric Brock    [id number]    October 22, 1940  Primary Care Physician:Burchette, Wolm ORN, MD  Referring Physician: Micheal Wolm ORN, MD [address] [place],  [place] [address]  Chief complaint: Acute visit for bronchiectasis flareup  HPI: 83 y.o. who  has a past medical history of Allergic rhinitis, Arthritis, Bronchiectasis (pulmologist-  dr SHELAH ([place])--  per lov note 03-22-2016 stable), Eczema, Elevated PSA, History of colon polyps, History of concussion, History of non-Hodgkin's lymphoma (oncologist-  dr cloretta--  pt in remission), Hyperlipidemia, Lower urinary tract symptoms (LUTS), Prostate cancer [facility]) (urologist-  dr thalia  oncologist-  dr amadeo and dr patrcia), and Wears glasses.  Discussed the use of AI scribe software for clinical note transcription with the patient, who gave verbal consent to proceed.  History of Present Illness Eric Brock is an 83 year old male with bronchiectasis who presents with a flare-up of symptoms.  He is a patient of Dr. SHELAH  Bronchiectasis exacerbation - Flare-ups occur once or twice per year - Current episode characterized by increased mucus production, primarily clear with some yellow when expectorating deeply - Associated cough present - No shortness of breath or wheezing - Past exacerbations treated with doxycycline  - Does not use inhalers  Upper respiratory symptoms - Nasal congestion present - Sinus issues ongoing - Cough persists  Fatigue - Feels tired midway through the day despite efforts to remain active  Allergic rhinitis and immunotherapy - History of allergies - Currently undergoing immunotherapy  Medication use - Takes Advil Cold and Sinus, which provides relief for approximately four hours  Tobacco use - Stopped smoking in 1977  Outpatient Encounter Medications as of 03/28/2024  Medication Sig   Avanafil 200 MG TABS Take 1 tablet by mouth once a week.   ciclopirox  (LOPROX ) 0.77  % cream Apply topically 2 (two) times daily.   Desoximetasone  (TOPICORT ) 0.25 % ointment Apply 1 application topically 2 (two) times daily.   EPINEPHrine 0.3 mg/0.3 mL IJ SOAJ injection Inject into the muscle.   escitalopram  (LEXAPRO ) 10 MG tablet TAKE 1 TABLET BY MOUTH DAILY   fluticasone  (FLONASE ) 50 MCG/ACT nasal spray SPRAY TWO SPRAYS IN EACH NOSTRIL DAILY AS NEEDED   GuaiFENesin  (MUCINEX  PO) Take 1,200 mg by mouth 2 (two) times daily. Extended release   ibuprofen (ADVIL,MOTRIN) 200 MG tablet Take 200 mg by mouth every 6 (six) hours as needed.   mometasone  (ELOCON ) 0.1 % cream Apply topically daily.   Multiple Vitamins-Minerals (CENTRUM ADULTS PO) Take 1 tablet by mouth daily.   omeprazole  (PRILOSEC) 40 MG capsule TAKE 1 CAPSULE BY MOUTH DAILY   Respiratory Therapy Supplies (FLUTTER) DEVI 1 Device by Does not apply route as needed.   rosuvastatin  (CRESTOR ) 10 MG tablet TAKE 1 TABLET BY MOUTH DAILY   Acetaminophen (TYLENOL PO) Take by mouth. (Patient not taking: Reported on 03/28/2024)   albuterol  (VENTOLIN  HFA) 108 (90 Base) MCG/ACT inhaler Inhale 2 puffs into the lungs every 6 (six) hours as needed for wheezing or shortness of breath. (Patient not taking: Reported on 03/28/2024)   fexofenadine (ALLEGRA) 180 MG tablet Take 180 mg by mouth every morning. (Patient not taking: Reported on 03/28/2024)   No facility-administered encounter medications on file as of 03/28/2024.     Physical Exam: Today's Vitals   03/28/24 1317  BP: 130/86  Pulse: (!) 51  Temp: 98.5 F (36.9 C)  TempSrc: Oral  SpO2: 96%  Weight: 187 lb (84.8 kg)  Height: 5' 10 (1.778 m)   Body mass index is 26.83 kg/m.  Physical Exam GEN: No acute distress. CV: Regular rate and rhythm, no murmurs. LUNGS: Crackles and congestion present, otherwise clear to auscultation bilaterally with normal respiratory effort. SKIN JOINTS: Warm and dry, no rash.    Data Reviewed: Imaging: CT chest 03/01/2019-stable pattern of  bronchiectasis, atelectasis in the right middle lobe and lingula. Chest x-ray 08/10/2023-chronic right middle lobe and lingular opacities I reviewed the images personally.  PFTs:  Labs:  Assessment & Plan Bronchiectasis with acute flare Bronchiectasis with acute flare characterized by increased mucus production, some of which is yellow, and sinus congestion. No shortness of breath or wheezing. Similar flare-ups occur once or twice a year, typically managed with antibiotics. Current symptoms suggest mild exacerbation. Physical examination reveals crackles and congestion, but condition is well-managed. - Provide paper prescription for doxycycline  to be used if symptoms worsen. - Discussed option of holding the prescription until symptoms worsen, as he is currently managing symptoms with over-the-counter Advil Cold and Sinus.  Recommendations: Prescribe doxycycline  to be used as needed Continue over-the-counter medications  Starla Deller MD [facility] 03/28/2024, 1:45 PM  CC: Micheal Wolm ORN, MD

## 2024-03-28 NOTE — Patient Instructions (Signed)
  VISIT SUMMARY: Today, you were seen for a flare-up of your bronchiectasis, which is causing increased mucus production and sinus congestion. You also reported feeling fatigued and having ongoing nasal congestion and sinus issues. We discussed your current symptoms and management plan.  YOUR PLAN: -BRONCHIECTASIS WITH ACUTE FLARE: Bronchiectasis is a condition where the airways in your lungs become damaged, leading to mucus build-up and infections. You are experiencing a mild flare-up with increased mucus and sinus congestion. We have provided a prescription for doxycycline  to use if your symptoms worsen. For now, you can continue managing your symptoms with Advil Cold and Sinus.  INSTRUCTIONS: If your symptoms worsen, please fill the prescription for doxycycline  and take it as directed. Continue using Advil Cold and Sinus as needed for relief. Follow up with us  if you have any concerns or if your symptoms do not improve.

## 2024-04-09 ENCOUNTER — Ambulatory Visit: Payer: Self-pay

## 2024-04-09 ENCOUNTER — Encounter: Payer: Self-pay | Admitting: Pulmonary Disease

## 2024-04-09 ENCOUNTER — Ambulatory Visit: Admitting: Pulmonary Disease

## 2024-04-09 VITALS — BP 128/78 | HR 70 | Temp 97.5°F | Ht 70.0 in | Wt 186.0 lb

## 2024-04-09 DIAGNOSIS — Z87891 Personal history of nicotine dependence: Secondary | ICD-10-CM

## 2024-04-09 DIAGNOSIS — J471 Bronchiectasis with (acute) exacerbation: Secondary | ICD-10-CM | POA: Diagnosis not present

## 2024-04-09 DIAGNOSIS — J479 Bronchiectasis, uncomplicated: Secondary | ICD-10-CM

## 2024-04-09 MED ORDER — LEVOFLOXACIN 500 MG PO TABS: 500.0000 mg | ORAL_TABLET | Freq: Every day | ORAL | 0 refills | Status: AC

## 2024-04-09 NOTE — Progress Notes (Signed)
 Synopsis: Referred in by Micheal Wolm ORN, MD   Subjective:   PATIENT ID: Eric Brock GENDER: male DOB: 06-28-1941, MRN: [id number]  Chief Complaint  Patient presents with   Acute Visit    Saw Dr. Theophilus on 8/27. Finished Doxycyline on Friday but still having wheezing, cough with clear sputum and congestion. No fevers, chills or sweats.    HPI Eric Brock is an 83 year old male patient with a past medical history of bronchiectasis and allergic rhinitis who follows with Dr. Shelah presenting today to the pulmonary clinic for bronchiectatic exacerbation.  He reports that about 10 days ago he started having worsening cough with sputum production.  He denies any fever or chills.  He saw Dr. Theophilus on 08/27 and was prescribed doxycycline  for 7 days.  He completed the course but still coughing up significant phlegm clear in color.  He still denies any fever or chills.  He denies any shortness of breath.  No PFTs on file to review.  Last microdata was in 2015 -expectorated sputum with normal oropharyngeal flora.  Never isolated Pseudomonas in the past.  Last CT chest in 2020 in the right middle lobe and medial segment of the lingula bronchiectatic changes with atelectasis.  Some mucoid impaction worsening.  ROS -all systems reviewed and are negative except for the above  Family History  Problem Relation Age of Onset   Heart disease Father 64   Cancer Brother        prostate   Brain cancer Mother    Colon cancer Neg Hx    Colon polyps Neg Hx    Esophageal cancer Neg Hx    Rectal cancer Neg Hx    Stomach cancer Neg Hx      Social History   Socioeconomic History   Marital status: Married    Spouse name: Not on file   Number of children: Not on file   Years of education: Not on file   Highest education level: Associate degree: academic program  Occupational History   Occupation: retired    Associate Professor: RETIRED  Tobacco Use   Smoking status: Former    Current packs/day:  0.00    Average packs/day: 1 pack/day for 15.0 years (15.0 ttl pk-yrs)    Types: Cigarettes    Start date: 27    Quit date: 08/03/1975    Years since quitting: 48.7   Smokeless tobacco: Never  Vaping Use   Vaping status: Never Used  Substance and Sexual Activity   Alcohol use: Yes    Alcohol/week: 7.0 standard drinks of alcohol    Types: 7 Glasses of wine per week    Comment: 1 glass of wine daily   Drug use: No   Sexual activity: Yes  Other Topics Concern   Not on file  Social History Narrative   Right handed   Drinks caffeine   Two story home   Social Drivers of Health   Financial Resource Strain: Low Risk  (01/17/2023)   Overall Financial Resource Strain (CARDIA)    Difficulty of Paying Living Expenses: Not hard at all  Food Insecurity: No Food Insecurity (01/17/2023)   Hunger Vital Sign    Worried About Running Out of Food in the Last Year: Never true    Ran Out of Food in the Last Year: Never true  Transportation Needs: No Transportation Needs (01/17/2023)   PRAPARE - Administrator, Civil Service (Medical): No    Lack of Transportation (Non-Medical): No  Physical  Activity: Sufficiently Active (01/17/2023)   Exercise Vital Sign    Days of Exercise per Week: 5 days    Minutes of Exercise per Session: 90 min  Stress: No Stress Concern Present (01/17/2023)   [employer] of Occupational Health - Occupational Stress Questionnaire    Feeling of Stress : Not at all  Social Connections: Moderately Integrated (01/17/2023)   Social Connection and Isolation Panel    Frequency of Communication with Friends and Family: Twice a week    Frequency of Social Gatherings with Friends and Family: Once a week    Attends Religious Services: 1 to 4 times per year    Active Member of Clubs or Organizations: No    Attends Banker Meetings: Never    Marital Status: Married  Catering manager Violence: Not At Risk (01/17/2023)   Humiliation, Afraid, Rape, and Kick  questionnaire    Fear of Current or Ex-Partner: No    Emotionally Abused: No    Physically Abused: No    Sexually Abused: No        Objective:   Vitals:   04/09/24 1326  BP: 128/78  Pulse: 70  Temp: (!) 97.5 F (36.4 C)  SpO2: 97%  Weight: 186 lb (84.4 kg)  Height: 5' 10 (1.778 m)   97% on RA BMI Readings from Last 3 Encounters:  04/09/24 26.69 kg/m  03/28/24 26.83 kg/m  09/15/23 26.72 kg/m   Wt Readings from Last 3 Encounters:  04/09/24 186 lb (84.4 kg)  03/28/24 187 lb (84.8 kg)  09/15/23 186 lb 3.2 oz (84.5 kg)    Physical Exam GEN: NAD HEENT: Supple Neck, Reactive Pupils, EOMI  CVS: Normal S1, Normal S2, RRR, No murmurs or ES appreciated  Lungs: Faint rhonchi appreciated bilaterally Abdomen: Soft, non tender, non distended, + BS  Extremities: Warm and well perfused, No edema  Skin: No suspicious lesions appreciated  Psych: Normal Affect  Ancillary Information   CBC    Component Value Date/Time   WBC 8.1 01/17/2023 1149   RBC 4.54 01/17/2023 1149   HGB 14.3 01/17/2023 1149   HCT 43.5 01/17/2023 1149   PLT 219.0 01/17/2023 1149   MCV 96.0 01/17/2023 1149   MCH 31.5 11/15/2021 1427   MCHC 32.9 01/17/2023 1149   RDW 13.4 01/17/2023 1149   LYMPHSABS 1.7 01/17/2023 1149   MONOABS 0.6 01/17/2023 1149   EOSABS 0.3 01/17/2023 1149   BASOSABS 0.1 01/17/2023 1149        No data to display           Assessment & Plan:  Eric Brock is an 83 year old male patient with a past medical history of bronchiectasis and allergic rhinitis who follows with Dr. Shelah presenting today to the pulmonary clinic for bronchiectatic exacerbation.  # Bronchiectasis with acute exacerbation  We discussed prolonging course of antibiotics and change of antibiotic to Levaquin  though we do not have any microdata.  I am okay going Levaquin  for 5 days.  I have advised him to use his flutter valve daily for the next week I will also repeat a CT chest without contrast to  assess for any persistent endobronchial filling defect and based on that need for bronchoscopy will be determined.  Return in about 8 weeks (around 06/04/2024) for Follow up with Dr. Shelah and CT chest wo contrast.  I personally spent a total of 60 minutes in the care of the patient today including preparing to see the patient, getting/reviewing separately obtained history, performing a  medically appropriate exam/evaluation, counseling and educating, placing orders, documenting clinical information in the EHR, and independently interpreting results.   Darrin Barn, MD [facility] 04/09/2024 2:17 PM

## 2024-04-09 NOTE — Telephone Encounter (Signed)
 FYI Only or Action Required?: FYI only for provider.  Patient was last seen in Pulmonary on 03/28/2024  Called Nurse Triage reporting Wheezing.  Symptoms began yesterday.  Interventions attempted: OTC medications: Delsyn for the cough.  Symptoms are: unchanged.  Triage Disposition: See HCP Within 4 Hours (Or PCP Triage)  Patient/caregiver understands and will follow disposition?: Yes  **Patient scheduled same day pulm. Appt at [facility] office, at 1:30 pm with Dr. Malka**             Copied from CRM [id number]. Topic: Clinical - Red Word Triage >> Apr 09, 2024 11:31 AM Eric Brock wrote: Red Word that prompted transfer to Nurse Triage: wheezing, lots of mucus,occasional coughing/saw dr last week/not betterwhat to do? Reason for Disposition  Wheezing is present  Answer Assessment - Initial Assessment Questions 1. ONSET: When did the cough begin?      X 2 weeks  2. SEVERITY: How bad is the cough today?       Continuous   3. SPUTUM: Describe the color of your sputum (e.g., none, dry cough; clear, white, yellow, green)     Clear  4. HEMOPTYSIS: Are you coughing up any blood? If Yes, ask: How much? (e.g., flecks, streaks, tablespoons, etc.)     No  5. DIFFICULTY BREATHING: Are you having difficulty breathing? If Yes, ask: How bad is it? (e.g., mild, moderate, severe)      None  6. FEVER: Do you have a fever? If Yes, ask: What is your temperature, how was it measured, and when did it start?     No  7. CARDIAC HISTORY: Do you have any history of heart disease? (e.g., heart attack, congestive heart failure)      No  8. LUNG HISTORY: Do you have any history of lung disease?  (e.g., pulmonary embolus, asthma, emphysema)     No  9. PE RISK FACTORS: Do you have a history of blood clots? (or: recent major surgery, recent prolonged travel, bedridden)     No  10. OTHER SYMPTOMS: Do you have any other symptoms? (e.g., runny nose, wheezing, chest  pain)  Fatigue intermittently , wheezing. Seen in office on 8/27. Delsyn taking for symptoms.  Protocols used: Cough - Acute Productive-A-AH

## 2024-04-11 ENCOUNTER — Telehealth: Payer: Self-pay | Admitting: Emergency Medicine

## 2024-04-11 MED ORDER — ALBUTEROL SULFATE HFA 108 (90 BASE) MCG/ACT IN AERS: 2.0000 | INHALATION_SPRAY | Freq: Four times a day (QID) | RESPIRATORY_TRACT | 11 refills | Status: AC | PRN

## 2024-04-11 NOTE — Telephone Encounter (Signed)
 Dr. Shelah please advise. Dr. Malka saw him for an acute visit on 04/09/2024.

## 2024-04-11 NOTE — Telephone Encounter (Signed)
 I have sent in the prescription and notified the patient.  Nothing further needed.

## 2024-04-11 NOTE — Telephone Encounter (Signed)
 Copied from CRM [id number]. Topic: Clinical - Medication Refill >> Apr 11, 2024 10:33 AM Leila BROCKS wrote: Patient [phone] with Dr. Malka for an emergency inhaler albuterol  (VENTOLIN  HFA) 108 (90 Base) MCG/ACT inhaler  during office visit 04/09/24 and forgot to ask for a refill. Patient would like to have it on hand, in case. Please advise and call back.   [facility] [id number] - [address] [place] - [address] [place] [address] [phone]:[phone]

## 2024-04-11 NOTE — Telephone Encounter (Signed)
 Agree, please refill for him

## 2024-04-18 DIAGNOSIS — J301 Allergic rhinitis due to pollen: Secondary | ICD-10-CM | POA: Diagnosis not present

## 2024-04-18 DIAGNOSIS — J3081 Allergic rhinitis due to animal (cat) (dog) hair and dander: Secondary | ICD-10-CM | POA: Diagnosis not present

## 2024-04-18 DIAGNOSIS — J3089 Other allergic rhinitis: Secondary | ICD-10-CM | POA: Diagnosis not present

## 2024-04-19 ENCOUNTER — Other Ambulatory Visit: Payer: Self-pay | Admitting: Family Medicine

## 2024-04-26 DIAGNOSIS — J301 Allergic rhinitis due to pollen: Secondary | ICD-10-CM | POA: Diagnosis not present

## 2024-04-26 DIAGNOSIS — J3081 Allergic rhinitis due to animal (cat) (dog) hair and dander: Secondary | ICD-10-CM | POA: Diagnosis not present

## 2024-04-26 DIAGNOSIS — J3089 Other allergic rhinitis: Secondary | ICD-10-CM | POA: Diagnosis not present

## 2024-04-30 ENCOUNTER — Encounter: Payer: Self-pay | Admitting: Pulmonary Disease

## 2024-05-01 NOTE — Telephone Encounter (Signed)
 I have tried to contact the patient and had to leave a message on both numbers that we have for him

## 2024-05-01 NOTE — Telephone Encounter (Signed)
 I have spoke with Mr. Rinn and his CT has been scheduled on 05/16/24 @ 12:30pm at [facility] [address] [place] location. The patient is aware

## 2024-05-09 DIAGNOSIS — J3081 Allergic rhinitis due to animal (cat) (dog) hair and dander: Secondary | ICD-10-CM | POA: Diagnosis not present

## 2024-05-09 DIAGNOSIS — J301 Allergic rhinitis due to pollen: Secondary | ICD-10-CM | POA: Diagnosis not present

## 2024-05-09 DIAGNOSIS — J3089 Other allergic rhinitis: Secondary | ICD-10-CM | POA: Diagnosis not present

## 2024-05-16 ENCOUNTER — Ambulatory Visit
Admission: RE | Admit: 2024-05-16 | Discharge: 2024-05-16 | Disposition: A | Discharge status: Home / Self Care | Source: Ambulatory Visit | Attending: Family Medicine | Admitting: Family Medicine

## 2024-05-16 ENCOUNTER — Other Ambulatory Visit

## 2024-05-16 DIAGNOSIS — R911 Solitary pulmonary nodule: Secondary | ICD-10-CM | POA: Diagnosis not present

## 2024-05-16 DIAGNOSIS — J479 Bronchiectasis, uncomplicated: Secondary | ICD-10-CM

## 2024-05-17 ENCOUNTER — Other Ambulatory Visit: Payer: Self-pay | Admitting: Family Medicine

## 2024-05-18 DIAGNOSIS — J301 Allergic rhinitis due to pollen: Secondary | ICD-10-CM | POA: Diagnosis not present

## 2024-05-18 DIAGNOSIS — J3081 Allergic rhinitis due to animal (cat) (dog) hair and dander: Secondary | ICD-10-CM | POA: Diagnosis not present

## 2024-05-18 DIAGNOSIS — J3089 Other allergic rhinitis: Secondary | ICD-10-CM | POA: Diagnosis not present

## 2024-05-21 ENCOUNTER — Ambulatory Visit: Payer: Self-pay | Admitting: Pulmonary Disease

## 2024-05-21 NOTE — Telephone Encounter (Signed)
 This is a patient of yours who was seen by Dr. Malka for an acute visit.

## 2024-05-23 ENCOUNTER — Encounter: Payer: Self-pay | Admitting: Emergency Medicine

## 2024-05-23 MED ORDER — LEVOFLOXACIN 500 MG PO TABS: 500.0000 mg | ORAL_TABLET | Freq: Every day | ORAL | 0 refills | Status: AC

## 2024-05-23 NOTE — Telephone Encounter (Signed)
Please advise on ct scan

## 2024-05-23 NOTE — Telephone Encounter (Signed)
 There is evidence for persistent airways inflammation and mucus that is similar to on his previous scans.  I think would be reasonable to treat him with an alternative antibiotic to see if he gets more benefit and more sustained benefit.  Please give him Levaquin  500 mg daily for 7 days.

## 2024-05-24 ENCOUNTER — Other Ambulatory Visit: Payer: Self-pay | Admitting: Family Medicine

## 2024-05-24 DIAGNOSIS — J3089 Other allergic rhinitis: Secondary | ICD-10-CM | POA: Diagnosis not present

## 2024-05-24 DIAGNOSIS — J301 Allergic rhinitis due to pollen: Secondary | ICD-10-CM | POA: Diagnosis not present

## 2024-05-24 DIAGNOSIS — J3081 Allergic rhinitis due to animal (cat) (dog) hair and dander: Secondary | ICD-10-CM | POA: Diagnosis not present

## 2024-05-28 ENCOUNTER — Other Ambulatory Visit: Payer: Self-pay | Admitting: Family Medicine

## 2024-05-31 ENCOUNTER — Other Ambulatory Visit: Payer: Self-pay | Admitting: Family Medicine

## 2024-05-31 NOTE — Telephone Encounter (Unsigned)
 Copied from CRM [id number]. Topic: Clinical - Medication Refill >> May 31, 2024  3:17 PM Mercedes MATSU wrote: Medication:  rosuvastatin  (CRESTOR ) 10 MG tablet  Has the patient contacted their pharmacy? No (Agent: If no, request that the patient contact the pharmacy for the refill. If patient does not wish to contact the pharmacy document the reason why and proceed with request.) (Agent: If yes, when and what did the pharmacy advise?)  This is the patient's preferred pharmacy:  [facility] [id number] - [place], [place] - [address] [place] [address] Phone: [phone] Fax: [phone]  Is this the correct pharmacy for this prescription? Yes If no, delete pharmacy and type the correct one.   Has the prescription been filled recently? Yes  Is the patient out of the medication? Yes  Has the patient been seen for an appointment in the last year OR does the patient have an upcoming appointment? Yes  Can we respond through MyChart? Yes  Agent: Please be advised that Rx refills may take up to 3 business days. We ask that you follow-up with your pharmacy.

## 2024-06-01 MED ORDER — ROSUVASTATIN CALCIUM 10 MG PO TABS: 10.0000 mg | ORAL_TABLET | Freq: Every day | ORAL | 0 refills | Status: DC

## 2024-06-01 NOTE — Telephone Encounter (Signed)
 Rx done.

## 2024-06-05 DIAGNOSIS — J3089 Other allergic rhinitis: Secondary | ICD-10-CM | POA: Diagnosis not present

## 2024-06-05 DIAGNOSIS — J301 Allergic rhinitis due to pollen: Secondary | ICD-10-CM | POA: Diagnosis not present

## 2024-06-05 DIAGNOSIS — J3081 Allergic rhinitis due to animal (cat) (dog) hair and dander: Secondary | ICD-10-CM | POA: Diagnosis not present

## 2024-06-11 ENCOUNTER — Other Ambulatory Visit: Payer: Self-pay | Admitting: Family Medicine

## 2024-06-11 DIAGNOSIS — C61 Malignant neoplasm of prostate: Secondary | ICD-10-CM | POA: Diagnosis not present

## 2024-06-14 DIAGNOSIS — J301 Allergic rhinitis due to pollen: Secondary | ICD-10-CM | POA: Diagnosis not present

## 2024-06-14 DIAGNOSIS — J3081 Allergic rhinitis due to animal (cat) (dog) hair and dander: Secondary | ICD-10-CM | POA: Diagnosis not present

## 2024-06-14 DIAGNOSIS — J3089 Other allergic rhinitis: Secondary | ICD-10-CM | POA: Diagnosis not present

## 2024-06-17 ENCOUNTER — Other Ambulatory Visit: Payer: Self-pay | Admitting: Family Medicine

## 2024-06-17 DIAGNOSIS — K219 Gastro-esophageal reflux disease without esophagitis: Secondary | ICD-10-CM

## 2024-06-18 DIAGNOSIS — C61 Malignant neoplasm of prostate: Secondary | ICD-10-CM | POA: Diagnosis not present

## 2024-06-18 DIAGNOSIS — N5201 Erectile dysfunction due to arterial insufficiency: Secondary | ICD-10-CM | POA: Diagnosis not present

## 2024-06-21 ENCOUNTER — Encounter: Payer: Self-pay | Admitting: Emergency Medicine

## 2024-06-21 ENCOUNTER — Ambulatory Visit: Admitting: Emergency Medicine

## 2024-06-21 VITALS — BP 126/76 | HR 76 | Temp 98.1°F | Ht 70.0 in | Wt 192.0 lb

## 2024-06-21 DIAGNOSIS — R053 Chronic cough: Secondary | ICD-10-CM

## 2024-06-21 DIAGNOSIS — J3081 Allergic rhinitis due to animal (cat) (dog) hair and dander: Secondary | ICD-10-CM | POA: Diagnosis not present

## 2024-06-21 DIAGNOSIS — J301 Allergic rhinitis due to pollen: Secondary | ICD-10-CM | POA: Diagnosis not present

## 2024-06-21 DIAGNOSIS — J479 Bronchiectasis, uncomplicated: Secondary | ICD-10-CM | POA: Diagnosis not present

## 2024-06-21 DIAGNOSIS — J3089 Other allergic rhinitis: Secondary | ICD-10-CM | POA: Diagnosis not present

## 2024-06-21 NOTE — Patient Instructions (Addendum)
 I glad that your breathing and cough are doing better We reviewed your CT scan of the chest from 05/16/2024.  This is stable compared with your priors.  Good news Please continue Mucinex  600 mg twice a day Flu shot and RSV vaccine are both up-to-date Use your flutter valve on a daily schedule, each evening, to help clear mucus Okay to use your albuterol  2 puffs if needed for shortness of breath or to help with mucus clearance Continue your Flonase  and your Allegra as you have been taking them Continue your allergy  shots as directed by Dr. Frutoso Follow with Dr. Shelah in about 6 months.  Please call sooner if you have any problems.

## 2024-06-21 NOTE — Assessment & Plan Note (Signed)
 Significant flare in August and September, seem to have been precipitated by increased sinus drainage and congestion.  His repeat CT did not show any new progression of bronchiectasis, micronodular disease, infiltrates.  He was treated initially with doxycycline  and then required an extended course of levofloxacin .  He is now improved.  Plan to continue his mucociliary clearance efforts with Mucinex , scheduled flutter valve.  We discussed and considered repeat sputum cultures, possibly even bronchoscopy but given his improvement and his stable CT scan I think we can defer for now.  His flu shot and RSV vaccine are both up-to-date.  I did discuss with him the possible benefit of using his albuterol  as needed to help with his mucus clearance.

## 2024-06-21 NOTE — Assessment & Plan Note (Signed)
 Due to his bronchiectasis and to chronic rhinitis.  Working to manage both as aggressively as possible

## 2024-06-21 NOTE — Progress Notes (Signed)
   Subjective:    Patient ID: Eric Brock, male    DOB: 01/11/1941, 82 y.o.   MRN: [id number]  Cough    ROV 06/21/2024 --follow-up visit 83 year old gentleman with a history of non-Hodgkin Phoma, prostate cancer, bronchiectasis with associated purulent sputum, chronic cough.  Exacerbated by severe allergic rhinitis.  He was seen by Dr. Theophilus and then by Dr. Malka in August and then September with acute bronchitic symptoms.  He was treated with doxycycline  but continued to have cough and purulent sputum, then treated with levofloxacin .  He had a repeat CT chest 05/16/2024. He is doing better - less cough, no more purulent sputum  He is on mucinex  600 mg bid. He will occasionally use tylenol cold/sinus. He remains on flonase , allegra, omperazole. He uses flutter valve daily w success. Has albuterol  to use as needed.  Flu shot up to date, RSV up to date.     Review of Systems  Respiratory:  Positive for cough.    As per HPI      Objective:   Physical Exam Vitals:   06/21/24 1117  BP: 126/76  Pulse: 76  Temp: 98.1 F (36.7 C)  TempSrc: Oral  SpO2: 95%  Weight: 192 lb (87.1 kg)  Height: 5' 10 (1.778 m)    Gen: Pleasant, well-nourished, in no distress,  normal affect  ENT: No lesions,  mouth clear,  oropharynx clear, no postnasal drip  Neck: No JVD, no stridor  Lungs: No use of accessory muscles, few scattered rhonchi on inspiration and expiration.  No wheezing  Cardiovascular: RRR, heart sounds normal, no murmur or gallops, no peripheral edema  Musculoskeletal: No deformities, no cyanosis or clubbing  Neuro: alert, awake, non focal  Skin: Warm, no lesions or rash      Assessment & Plan:  BRONCHIECTASIS Significant flare in August and September, seem to have been precipitated by increased sinus drainage and congestion.  His repeat CT did not show any new progression of bronchiectasis, micronodular disease, infiltrates.  He was treated initially with  doxycycline  and then required an extended course of levofloxacin .  He is now improved.  Plan to continue his mucociliary clearance efforts with Mucinex , scheduled flutter valve.  We discussed and considered repeat sputum cultures, possibly even bronchoscopy but given his improvement and his stable CT scan I think we can defer for now.  His flu shot and RSV vaccine are both up-to-date.  I did discuss with him the possible benefit of using his albuterol  as needed to help with his mucus clearance.  Allergic rhinitis Plan to continue his immunotherapy with Dr. Frutoso, Flonase , Allegra.  Chronic cough Due to his bronchiectasis and to chronic rhinitis.  Working to manage both as aggressively as possible   I personally spent a total of 30 minutes in the care of the patient today including preparing to see the patient, getting/reviewing separately obtained history, performing a medically appropriate exam/evaluation, counseling and educating, documenting clinical information in the EHR, independently interpreting results, and communicating results.   Eric Chris, MD, PhD 06/21/2024, 11:45 AM [facility] [phone] or if no answer [phone]

## 2024-06-21 NOTE — Assessment & Plan Note (Signed)
 Plan to continue his immunotherapy with Dr. Frutoso, Flonase , Allegra.

## 2024-06-26 ENCOUNTER — Ambulatory Visit: Admitting: Family Medicine

## 2024-06-26 ENCOUNTER — Ambulatory Visit: Payer: Self-pay | Admitting: Family Medicine

## 2024-06-26 ENCOUNTER — Encounter: Payer: Self-pay | Admitting: Family Medicine

## 2024-06-26 VITALS — BP 138/60 | HR 61 | Temp 97.6°F | Ht 69.29 in | Wt 190.3 lb

## 2024-06-26 DIAGNOSIS — I517 Cardiomegaly: Secondary | ICD-10-CM

## 2024-06-26 DIAGNOSIS — E785 Hyperlipidemia, unspecified: Secondary | ICD-10-CM

## 2024-06-26 DIAGNOSIS — R5383 Other fatigue: Secondary | ICD-10-CM | POA: Diagnosis not present

## 2024-06-26 DIAGNOSIS — R9389 Abnormal findings on diagnostic imaging of other specified body structures: Secondary | ICD-10-CM

## 2024-06-26 LAB — CBC WITH DIFFERENTIAL/PLATELET
Basophils Absolute: 0 K/uL (ref 0.0–0.1)
Basophils Relative: 0.6 % (ref 0.0–3.0)
Eosinophils Absolute: 0.2 K/uL (ref 0.0–0.7)
Eosinophils Relative: 3.1 % (ref 0.0–5.0)
HCT: 43.7 % (ref 39.0–52.0)
Hemoglobin: 14.6 g/dL (ref 13.0–17.0)
Lymphocytes Relative: 22.5 % (ref 12.0–46.0)
Lymphs Abs: 1.6 K/uL (ref 0.7–4.0)
MCHC: 33.5 g/dL (ref 30.0–36.0)
MCV: 95 fl (ref 78.0–100.0)
Monocytes Absolute: 0.5 K/uL (ref 0.1–1.0)
Monocytes Relative: 7.5 % (ref 3.0–12.0)
Neutro Abs: 4.8 K/uL (ref 1.4–7.7)
Neutrophils Relative %: 66.3 % (ref 43.0–77.0)
Platelets: 210 K/uL (ref 150.0–400.0)
RBC: 4.6 Mil/uL (ref 4.22–5.81)
RDW: 12.8 % (ref 11.5–15.5)
WBC: 7.2 K/uL (ref 4.0–10.5)

## 2024-06-26 LAB — LIPID PANEL
Cholesterol: 121 mg/dL (ref 0–200)
HDL: 41.8 mg/dL (ref >39.00)
LDL Cholesterol: 65 mg/dL (ref 0–99)
NonHDL: 79.62
Total CHOL/HDL Ratio: 3
Triglycerides: 71 mg/dL (ref 0.0–149.0)
VLDL: 14.2 mg/dL (ref 0.0–40.0)

## 2024-06-26 LAB — COMPREHENSIVE METABOLIC PANEL WITH GFR
ALT: 14 U/L (ref 0–53)
AST: 14 U/L (ref 0–37)
Albumin: 4.5 g/dL (ref 3.5–5.2)
Alkaline Phosphatase: 54 U/L (ref 39–117)
BUN: 18 mg/dL (ref 6–23)
CO2: 28 meq/L (ref 19–32)
Calcium: 9.3 mg/dL (ref 8.4–10.5)
Chloride: 105 meq/L (ref 96–112)
Creatinine, Ser: 0.95 mg/dL (ref 0.40–1.50)
GFR: 75.12 mL/min (ref >60.00)
Glucose, Bld: 83 mg/dL (ref 70–99)
Potassium: 4.5 meq/L (ref 3.5–5.1)
Sodium: 141 meq/L (ref 135–145)
Total Bilirubin: 0.5 mg/dL (ref 0.2–1.2)
Total Protein: 6.5 g/dL (ref 6.0–8.3)

## 2024-06-26 LAB — TSH: TSH: 2.91 u[IU]/mL (ref 0.35–5.50)

## 2024-06-26 MED ORDER — MOMETASONE FUROATE 0.1 % EX CREA: TOPICAL_CREAM | Freq: Every day | CUTANEOUS | 1 refills | Status: AC

## 2024-06-26 NOTE — Progress Notes (Signed)
 Established Patient Office Visit  Subjective   Patient ID: MARIANO DOSHI, male    DOB: 03-11-1941  Age: 83 y.o. MRN: [id number]  Chief Complaint  Patient presents with   Annual Exam    HPI   Mr. Bonura is here for routine medical follow-up.  He has history of bronchiectasis which is followed by pulmonary, osteoarthritis of multiple joints, history of prostate cancer, history of non-Hodgkin's lymphoma, hyperlipidemia.  Takes rosuvastatin  10 mg daily for hyperlipidemia and omeprazole  for GERD.  GERD symptoms are stable.  He has been very active most of his adult life.  For years played racquetball and then transition to pickleball.  However, has had some progressive knee issues which have limited activities and now gets most of his activity through yard work.  He had recent CT chest through pulmonary and this showed mild cardiomegaly and comment of left main and patchy three-vessel coronary artery calcifications heaviest in the LAD.  He denies any recent chest pain with activity or increased dyspnea.  Does have some nonspecific increased malaise.  He does take rosuvastatin  10 mg regularly and needs follow-up lipids today.  Non-smoker.  Family history significant for brother dying of metastatic prostate cancer.  He has sister died of polio complications.  His father had some vague history of possible CAD in his 53s but details are not clear.  He has history of recurrent eczematous rash and requesting refills of mometasone  0.1% cream.  Past Medical History:  Diagnosis Date   Allergic rhinitis    Arthritis    Bronchiectasis pulmologist-  dr SHELAH ([place])--  per lov note 03-22-2016 stable   intermittant--  per pt last bout yr ago 2016    Eczema    Elevated PSA    History of colon polyps    1999   History of concussion    2005 and 2010 with mild cerebral bleed resolved -no residual   History of non-Hodgkin's lymphoma oncologist-  dr cloretta--  pt in remission   dx 03/ 2000  ,   Stage IA,  scalp/forehead ,  post chemo/radioation therapy   Hyperlipidemia    diet controlled, no med   Lower urinary tract symptoms (LUTS)    Prostate cancer [facility]) urologist-  dr thalia  oncologist-  dr amadeo and dr manning   cT2b N0 M0, Gleason 4+3,  PSA 4.2,  vol 35.0cc--  plan IMRT w/ gold seeds and ADT   Wears glasses    Past Surgical History:  Procedure Laterality Date   CATARACT EXTRACTION W/ INTRAOCULAR LENS  IMPLANT, BILATERAL  2013   CHOLECYSTECTOMY  1994   COLONOSCOPY  last one 07-14-2015   GOLD SEED IMPLANT N/A 04/16/2016   Procedure: GOLD SEED IMPLANT;  Surgeon: [name], MD;  Location[facility];  Service: Urology;  Laterality: N/A;   PROSTATE BIOPSY N/A 01/12/2016   Procedure: BIOPSY TRANSRECTAL ULTRASONIC PROSTATE (TUBP);  Surgeon: [name], MD;  Location: [facility];  Service: Urology;  Laterality: N/A;   UMBILICAL HERNIA REPAIR  1992    reports that he quit smoking about 48 years ago. His smoking use included cigarettes. He started smoking about 63 years ago. He has a 15 pack-year smoking history. He has never used smokeless tobacco. He reports current alcohol use of about 7.0 standard drinks of alcohol per week. He reports that he does not use drugs. family history includes Brain cancer in his mother; Cancer in his brother; Heart disease (age of onset:  75) in his father. Allergies  Allergen Reactions   Cat Dander    Dust Mite Extract    Pollen Extract-Tree Extract [Pollen Extract]     Weed allergies also    Review of Systems  Constitutional:  Negative for malaise/fatigue.  Eyes:  Negative for blurred vision.  Respiratory:  Negative for shortness of breath.   Cardiovascular:  Negative for chest pain, palpitations, orthopnea, leg swelling and PND.  Neurological:  Negative for dizziness, weakness and headaches.      Objective:     BP 138/60   Pulse 61   Temp 97.6 F (36.4 C) (Oral)   Ht 5' 9.29 (1.76  m)   Wt 190 lb 4.8 oz (86.3 kg)   SpO2 95%   BMI 27.87 kg/m  BP Readings from Last 3 Encounters:  06/26/24 138/60  06/21/24 126/76  04/09/24 128/78   Wt Readings from Last 3 Encounters:  06/26/24 190 lb 4.8 oz (86.3 kg)  06/21/24 192 lb (87.1 kg)  04/09/24 186 lb (84.4 kg)      Physical Exam Vitals reviewed.  Constitutional:      General: He is not in acute distress.    Appearance: He is not ill-appearing.  Cardiovascular:     Rate and Rhythm: Normal rate and regular rhythm.  Pulmonary:     Effort: Pulmonary effort is normal.     Comments: Few faint expiratory wheezes. Musculoskeletal:     Right lower leg: No edema.     Left lower leg: No edema.  Neurological:     Mental Status: He is alert.      No results found for any visits on 06/26/24.    The ASCVD Risk score (Arnett DK, et al., 2019) failed to calculate for the following reasons:   The 2019 ASCVD risk score is only valid for ages 36 to 56    Assessment & Plan:   #1 hyperlipidemia.  Patient on rosuvastatin  10 mg daily and tolerating well.  Recheck lipid and CMP.  He is fasting today.  Continue low saturated fat diet  #2 nonspecific malaise.  Will add CBC and TSH to the labs above.  No recent intolerance to yard work and other basic daily activities.  Sleeping fairly well.  #3 coronary artery calcifications noted on recent CT chest.  Patient basically asymptomatic.  We explained that coronary calcifications are extremely common at his age.  Even though he is asymptomatic he would like to discuss with cardiology whether to consider further evaluation.  There was comment of predominance of calcifications LAD.  Will set up cardiology consult to discuss pros and cons of whether to further assess with CT morphology versus other evaluation.     No follow-ups on file.    Wolm Scarlet, MD

## 2024-06-26 NOTE — Patient Instructions (Signed)
I will set up cardiology referral.

## 2024-06-27 ENCOUNTER — Other Ambulatory Visit: Payer: Self-pay | Admitting: Family Medicine

## 2024-06-27 DIAGNOSIS — J3081 Allergic rhinitis due to animal (cat) (dog) hair and dander: Secondary | ICD-10-CM | POA: Diagnosis not present

## 2024-06-27 DIAGNOSIS — J3089 Other allergic rhinitis: Secondary | ICD-10-CM | POA: Diagnosis not present

## 2024-06-27 DIAGNOSIS — J301 Allergic rhinitis due to pollen: Secondary | ICD-10-CM | POA: Diagnosis not present

## 2024-07-12 ENCOUNTER — Ambulatory Visit: Attending: Internal Medicine | Admitting: Internal Medicine

## 2024-07-12 ENCOUNTER — Encounter: Payer: Self-pay | Admitting: Internal Medicine

## 2024-07-12 VITALS — BP 132/79 | HR 80 | Ht 70.0 in | Wt 188.0 lb

## 2024-07-12 DIAGNOSIS — R053 Chronic cough: Secondary | ICD-10-CM | POA: Diagnosis not present

## 2024-07-12 DIAGNOSIS — I2584 Coronary atherosclerosis due to calcified coronary lesion: Secondary | ICD-10-CM

## 2024-07-12 DIAGNOSIS — E785 Hyperlipidemia, unspecified: Secondary | ICD-10-CM

## 2024-07-12 DIAGNOSIS — I2510 Atherosclerotic heart disease of native coronary artery without angina pectoris: Secondary | ICD-10-CM | POA: Diagnosis not present

## 2024-07-12 MED ORDER — ASPIRIN 81 MG PO TBEC: 81.0000 mg | DELAYED_RELEASE_TABLET | Freq: Every day | ORAL | Status: AC

## 2024-07-12 NOTE — Progress Notes (Addendum)
 Cardiology Office Note   Date:  07/12/2024   ID:  Eric Brock, DOB 1941-03-13, MRN [id number]  PCP:  Micheal Wolm ORN, MD  Cardiologist:   [name], MD   PT presents for evaluatoin of CAD    History of Present Illness: Eric Brock is a 83 y.o. male with a history of bronchiectasis and HL   He is followed by [name] and also by JONELLE Eric   He has had CTs of chest for lung dz  that have shown coronary artery calcifications.   The pt denies CP   Breathing is good   He is not too active though because of  knee arthritis which limits his activity  Active Medications[1]   Allergies:   Cat dander, Dust mite extract, and Pollen extract-tree extract [pollen extract]   Past Medical History:  Diagnosis Date   Allergic rhinitis    Arthritis    Bronchiectasis pulmologist-  dr Eric (Brock)--  per lov note 03-22-2016 stable   intermittant--  per pt last bout yr ago 2016    Eczema    Elevated PSA    History of colon polyps    1999   History of concussion    2005 and 2010 with mild cerebral bleed resolved -no residual   History of non-Hodgkin's lymphoma oncologist-  dr cloretta--  pt in remission   dx 03/ 2000  ,  Stage IA,  scalp/forehead ,  post chemo/radioation therapy   Hyperlipidemia    diet controlled, no med   Lower urinary tract symptoms (LUTS)    Prostate cancer [facility]) urologist-  dr thalia  oncologist-  dr amadeo and dr manning   cT2b N0 M0, Gleason 4+3,  PSA 4.2,  vol 35.0cc--  plan IMRT w/ gold seeds and ADT   Wears glasses     Past Surgical History:  Procedure Laterality Date   CATARACT EXTRACTION W/ INTRAOCULAR LENS  IMPLANT, BILATERAL  2013   CHOLECYSTECTOMY  1994   COLONOSCOPY  last one 07-14-2015   GOLD SEED IMPLANT N/A 04/16/2016   Procedure: GOLD SEED IMPLANT;  Surgeon: Belvie Brock Clara, MD;  Location: [facility];  Service: Urology;  Laterality: N/A;   PROSTATE BIOPSY N/A 01/12/2016   Procedure: BIOPSY TRANSRECTAL ULTRASONIC  PROSTATE (TUBP);  Surgeon: Belvie Brock Clara, MD;  [facility]  Service: Urology;  Laterality: N/A;   UMBILICAL HERNIA REPAIR  1992     Social History:  The patient  reports that he quit smoking about 48 years ago. His smoking use included cigarettes. He started smoking about 63 years ago. He has a 15 pack-year smoking history. He has never used smokeless tobacco. He reports current alcohol use of about 7.0 standard drinks of alcohol per week. He reports that he does not use drugs.   Family History:  The patient's family history includes Brain cancer in his mother; Cancer in his brother; Heart disease (age of onset: 75) in his father.    ROS:  Please see the history of present illness. All other systems are reviewed and  Negative to the above problem except as noted.    PHYSICAL EXAM: VS:  BP 132/79 (BP Location: Left Arm, Patient Position: Sitting, Cuff Size: Normal)   Pulse 80   Ht 5' 10 (1.778 m)   Wt 188 lb (85.3 kg)   SpO2 95%   BMI 26.98 kg/m   GEN: Well nourished, well developed, in no acute distress  HEENT: normal  Neck: no JVD, carotid bruits Cardiac: RRR; no murmurs Respiratory:  Clears with cough   Moving air GI: soft, nontender,  No hepatomegaly  Ext   2+ pulses   No LE edema   EKG:  EKG is not ordered today.   Lipid Panel    Component Value Date/Time   CHOL 121 06/26/2024 0901   TRIG 71.0 06/26/2024 0901   HDL 41.80 06/26/2024 0901   CHOLHDL 3 06/26/2024 0901   VLDL 14.2 06/26/2024 0901   LDLCALC 65 06/26/2024 0901   LDLCALC 129 (H) 07/11/2020 1453      Wt Readings from Last 3 Encounters:  07/12/24 188 lb (85.3 kg)  06/26/24 190 lb 4.8 oz (86.3 kg)  06/21/24 192 lb (87.1 kg)      ASSESSMENT AND PLAN:  1  CAD   Pt with coronary calcifications on LAD that are signficant   He denies CP but his activity is lmited by his arthritis     I would recomm lexiscan PET/CT To rulle out ischemia  Start ecASA  2  HL   Pt on Crestor    LDL in November was 65  HDL 41  Trig 71   Will check Apo B and Lpa      3  metabolics   Willl check Hgb A1C  4  Pulmonary   Follow with R Bryum     Current medicines are reviewed at length with the patient today.  The patient does not have concerns regarding medicines.  Signed, [name], MD      [1]  Current Meds  Medication Sig   Acetaminophen (TYLENOL PO) Take by mouth. (Patient taking differently: Take by mouth as needed.)   albuterol  (VENTOLIN  HFA) 108 (90 Base) MCG/ACT inhaler Inhale 2 puffs into the lungs every 6 (six) hours as needed for wheezing or shortness of breath.   Avanafil 200 MG TABS Take 1 tablet by mouth once a week.   ciclopirox  (LOPROX ) 0.77 % cream Apply topically 2 (two) times daily.   Desoximetasone  (TOPICORT ) 0.25 % ointment Apply 1 application topically 2 (two) times daily.   EPINEPHrine 0.3 mg/0.3 mL IJ SOAJ injection Inject into the muscle.   escitalopram  (LEXAPRO ) 10 MG tablet TAKE 1 TABLET BY MOUTH DAILY   fexofenadine (ALLEGRA) 180 MG tablet Take 180 mg by mouth every morning.   fluticasone  (FLONASE ) 50 MCG/ACT nasal spray SPRAY TWO SPRAYS IN EACH NOSTRIL DAILY AS NEEDED   GuaiFENesin  (MUCINEX  PO) Take 1,200 mg by mouth 2 (two) times daily. Extended release   ibuprofen (ADVIL,MOTRIN) 200 MG tablet Take 200 mg by mouth every 6 (six) hours as needed.   mometasone  (ELOCON ) 0.1 % cream Apply topically daily.   Multiple Vitamins-Minerals (CENTRUM ADULTS PO) Take 1 tablet by mouth daily.   omeprazole  (PRILOSEC) 40 MG capsule TAKE 1 CAPSULE BY MOUTH DAILY   Respiratory Therapy Supplies (FLUTTER) DEVI 1 Device by Does not apply route as needed.   rosuvastatin  (CRESTOR ) 10 MG tablet TAKE 1 TABLET BY MOUTH DAILY

## 2024-07-12 NOTE — Patient Instructions (Signed)
 Medication Instructions:  Start aspirin 81 mg.  *If you need a refill on your cardiac medications before your next appointment, please call your pharmacy*  Lab Work: APO B, Lipoprotein (a), A1C If you have labs (blood work) drawn today and your tests are completely normal, you will receive your results only by: MyChart Message (if you have MyChart) OR A paper copy in the mail If you have any lab test that is abnormal or we need to change your treatment, we will call you to review the results.  Testing/Procedures: Lexiscan  Pet CT  Follow-Up: At [facility], you and your health needs are our priority.  As part of our continuing mission to provide you with exceptional heart care, our providers are all part of one team.  This team includes your primary Cardiologist (physician) and Advanced Practice Providers or APPs (Physician Assistants and Nurse Practitioners) who all work together to provide you with the care you need, when you need it.  Your next appointment:   6 month(s)  Provider:   [name], MD    We recommend signing up for the patient portal called MyChart.  Sign up information is provided on this After Visit Summary.  MyChart is used to connect with patients for Virtual Visits (Telemedicine).  Patients are able to view lab/test results, encounter notes, upcoming appointments, etc.  Non-urgent messages can be sent to your provider as well.   To learn more about what you can do with MyChart, go to [email or web address].   Other Instructions    Please report to Radiology at the [facility] Main Entrance 30 minutes early for your test.  [address] [place], [place] [address]                         OR   Please report to Radiology at [facility] Main Entrance, medical mall, 30 mins prior to your test.  [address]  [place], [place]  How to Prepare for Your Cardiac PET/CT Stress Test:  Nothing to eat or drink,  except water, 3 hours prior to arrival time.  NO caffeine/decaffeinated products, or chocolate 12 hours prior to arrival. (Please note decaffeinated beverages (teas/coffees) still contain caffeine).  If you have caffeine within 12 hours prior, the test will need to be rescheduled.  Medication instructions: Do not take erectile dysfunction medications for 72 hours prior to test (sildenafil , tadalafil ) Do not take nitrates (isosorbide mononitrate, Ranexa) the day before or day of test Do not take tamsulosin the day before or morning of test Hold theophylline containing medications for 12 hours. Hold Dipyridamole 48 hours prior to the test.  Diabetic Preparation: If able to eat breakfast prior to 3 hour fasting, you may take all medications, including your insulin. Do not worry if you miss your breakfast dose of insulin - start at your next meal. If you do not eat prior to 3 hour fast-Hold all diabetes (oral and insulin) medications. Patients who wear a continuous glucose monitor MUST remove the device prior to scanning.  You may take your remaining medications with water.  NO perfume, cologne or lotion on chest or abdomen area. FEMALES - Please avoid wearing dresses to this appointment.  Total time is 1 to 2 hours; you may want to bring reading material for the waiting time.  IF YOU THINK YOU MAY BE PREGNANT, OR ARE NURSING PLEASE INFORM THE TECHNOLOGIST.  In preparation for your appointment, medication  and supplies will be purchased.  Appointment availability is limited, so if you need to cancel or reschedule, please call the Radiology Department Scheduler at [phone] 24 hours in advance to avoid a cancellation fee of $100.00  What to Expect When you Arrive:  Once you arrive and check in for your appointment, you will be taken to a preparation room within the Radiology Department.  A technologist or Nurse will obtain your medical history, verify that you are correctly prepped for the  exam, and explain the procedure.  Afterwards, an IV will be started in your arm and electrodes will be placed on your skin for EKG monitoring during the stress portion of the exam. Then you will be escorted to the PET/CT scanner.  There, staff will get you positioned on the scanner and obtain a blood pressure and EKG.  During the exam, you will continue to be connected to the EKG and blood pressure machines.  A small, safe amount of a radioactive tracer will be injected in your IV to obtain a series of pictures of your heart along with an injection of a stress agent.    After your Exam:  It is recommended that you eat a meal and drink a caffeinated beverage to counter act any effects of the stress agent.  Drink plenty of fluids for the remainder of the day and urinate frequently for the first couple of hours after the exam.  Your doctor will inform you of your test results within 7-10 business days.  For more information and frequently asked questions, please visit our website: [email or web address]  For questions about your test or how to prepare for your test, please call: Cardiac Imaging Nurse Navigators Office: [phone]     You are scheduled for a Myocardial Perfusion Imaging Study on: ___ at ___.  Please arrive 15 minutes prior to your appointment time for registration and insurance purposes.  The test will take approximately 3 to 4 hours to complete; you may bring reading material.  If someone comes with you to your appointment, they will need to remain in the main lobby due to limited space in the testing area. **If you are pregnant or breastfeeding, please notify the nuclear lab prior to your appointment**  How to prepare for your Myocardial Perfusion Test: Do not eat or drink 3 hours prior to your test, except you may have water. Do not consume products containing caffeine (regular or decaffeinated) 12 hours prior to your test. (ex: coffee, chocolate, sodas, tea). Do  bring a list of your current medications with you.  If not listed below, you may take your medications as normal. Do wear comfortable clothes (no dresses or overalls) and walking shoes, tennis shoes preferred (No heels or open toe shoes are allowed). Do NOT wear cologne, perfume, aftershave, or lotions (deodorant is allowed). If these instructions are not followed, your test will have to be rescheduled.

## 2024-07-13 ENCOUNTER — Encounter (HOSPITAL_COMMUNITY): Payer: Self-pay | Admitting: Internal Medicine

## 2024-07-13 ENCOUNTER — Other Ambulatory Visit: Payer: Self-pay | Admitting: Internal Medicine

## 2024-07-13 DIAGNOSIS — E785 Hyperlipidemia, unspecified: Secondary | ICD-10-CM

## 2024-07-13 DIAGNOSIS — R053 Chronic cough: Secondary | ICD-10-CM

## 2024-07-16 ENCOUNTER — Inpatient Hospital Stay (HOSPITAL_COMMUNITY)
Admission: RE | Admit: 2024-07-16 | Discharge: 2024-07-16 | Disposition: A | Source: Ambulatory Visit | Attending: Internal Medicine | Admitting: Internal Medicine

## 2024-07-16 ENCOUNTER — Ambulatory Visit: Payer: Self-pay | Admitting: Internal Medicine

## 2024-07-16 DIAGNOSIS — R053 Chronic cough: Secondary | ICD-10-CM

## 2024-07-16 DIAGNOSIS — E785 Hyperlipidemia, unspecified: Secondary | ICD-10-CM | POA: Insufficient documentation

## 2024-07-16 DIAGNOSIS — I2510 Atherosclerotic heart disease of native coronary artery without angina pectoris: Secondary | ICD-10-CM | POA: Insufficient documentation

## 2024-07-16 DIAGNOSIS — I2584 Coronary atherosclerosis due to calcified coronary lesion: Secondary | ICD-10-CM | POA: Diagnosis not present

## 2024-07-16 DIAGNOSIS — J479 Bronchiectasis, uncomplicated: Secondary | ICD-10-CM | POA: Insufficient documentation

## 2024-07-16 DIAGNOSIS — I517 Cardiomegaly: Secondary | ICD-10-CM | POA: Diagnosis not present

## 2024-07-16 LAB — MYOCARDIAL PERFUSION IMAGING
LV dias vol: 131 mL (ref 62–150)
LV sys vol: 55 mL (ref 4.2–5.8)
Nuc Stress EF: 58 %
Peak HR: 80 {beats}/min
Rest HR: 58 {beats}/min
Rest Nuclear Isotope Dose: 10 mCi
SDS: 0
SRS: 4
SSS: 0
ST Depression (mm): 0 mm
Stress Nuclear Isotope Dose: 32.7 mCi
TID: 1.09

## 2024-07-16 MED ORDER — REGADENOSON 0.4 MG/5ML IV SOLN
0.4000 mg | Freq: Once | INTRAVENOUS | Status: AC
  Administered 2024-07-16: 10:00:00 0.4 mg via INTRAVENOUS

## 2024-07-16 MED ORDER — TECHNETIUM TC 99M TETROFOSMIN IV KIT
32.7000 | PACK | Freq: Once | INTRAVENOUS | Status: AC | PRN
  Administered 2024-07-16: 10:00:00 32.7 via INTRAVENOUS

## 2024-07-16 MED ORDER — REGADENOSON 0.4 MG/5ML IV SOLN
INTRAVENOUS | Status: AC
  Filled 2024-07-16: qty 5

## 2024-07-16 MED ORDER — TECHNETIUM TC 99M TETROFOSMIN IV KIT
10.5000 | PACK | Freq: Once | INTRAVENOUS | Status: AC | PRN
  Administered 2024-07-16: 08:00:00 10.5 via INTRAVENOUS

## 2024-07-18 LAB — LIPOPROTEIN A (LPA): Lipoprotein (a): <8.4 nmol/L (ref <75.0)

## 2024-07-18 LAB — HEMOGLOBIN A1C
Est. average glucose Bld gHb Est-mCnc: 117 mg/dL
Hgb A1c MFr Bld: 5.7 % — ABNORMAL HIGH (ref 4.8–5.6)

## 2024-07-18 LAB — LIPID PANEL+APOB
Apolipoprotein B: 70 mg/dL (ref <90)
Cholesterol, Total: 129 mg/dL (ref 100–199)
HDL-C: 49 mg/dL (ref >39)
LDL-C (NIH Calc): 65 mg/dL (ref 0–99)
Non-HDL Cholesterol: 80 mg/dL (ref 0–129)
Triglycerides: 75 mg/dL (ref 0–149)

## 2024-08-09 ENCOUNTER — Ambulatory Visit: Admitting: Cardiology

## 2024-12-20 ENCOUNTER — Ambulatory Visit: Admitting: Emergency Medicine
